# Patient Record
Sex: Male | Born: 1940
Health system: Southern US, Community
[De-identification: ages and names within clinical notes are randomized; demographics above are authoritative.]

## PROBLEM LIST (undated history)

## (undated) DIAGNOSIS — K5792 Diverticulitis of intestine, part unspecified, without perforation or abscess without bleeding: Secondary | ICD-10-CM

## (undated) DIAGNOSIS — N200 Calculus of kidney: Secondary | ICD-10-CM

## (undated) DIAGNOSIS — N400 Enlarged prostate without lower urinary tract symptoms: Secondary | ICD-10-CM

## (undated) DIAGNOSIS — C801 Malignant (primary) neoplasm, unspecified: Secondary | ICD-10-CM

## (undated) DIAGNOSIS — K859 Acute pancreatitis without necrosis or infection, unspecified: Secondary | ICD-10-CM

## (undated) DIAGNOSIS — E119 Type 2 diabetes mellitus without complications: Secondary | ICD-10-CM

## (undated) HISTORY — PX: OTHER SURGICAL HISTORY: SHX169

## (undated) HISTORY — PX: WISDOM TOOTH EXTRACTION: SHX21

## (undated) HISTORY — PX: TONSILLECTOMY: SUR1361

---

## 1999-05-28 ENCOUNTER — Encounter: Admission: RE | Admit: 1999-05-28 | Discharge: 1999-08-26 | Payer: Self-pay | Admitting: Geriatric Medicine

## 2000-07-13 HISTORY — PX: SHOULDER ARTHROSCOPY WITH OPEN ROTATOR CUFF REPAIR: SHX6092

## 2002-11-22 ENCOUNTER — Encounter (INDEPENDENT_AMBULATORY_CARE_PROVIDER_SITE_OTHER): Payer: Self-pay | Admitting: Specialist

## 2002-11-22 ENCOUNTER — Ambulatory Visit (HOSPITAL_COMMUNITY): Admission: RE | Admit: 2002-11-22 | Discharge: 2002-11-22 | Payer: Self-pay | Admitting: Gastroenterology

## 2007-06-10 ENCOUNTER — Emergency Department (HOSPITAL_COMMUNITY): Admission: EM | Admit: 2007-06-10 | Discharge: 2007-06-10 | Payer: Self-pay | Admitting: Emergency Medicine

## 2007-12-13 ENCOUNTER — Encounter: Admission: RE | Admit: 2007-12-13 | Discharge: 2007-12-13 | Payer: Self-pay | Admitting: Geriatric Medicine

## 2010-09-19 ENCOUNTER — Other Ambulatory Visit: Payer: Self-pay | Admitting: Dermatology

## 2010-11-28 NOTE — Op Note (Signed)
NAME:  STEPAN, VERRETTE NO.:  [id number]   MEDICAL RECORD NO.:  [id number]                   PATIENT TYPE:  AMB   LOCATION:  ENDO                                 FACILITY:  [facility]   PHYSICIAN:  Danise Edge, M.D.                DATE OF BIRTH:  [birth year]   DATE OF PROCEDURE:  11/22/2002  DATE OF DISCHARGE:                                 OPERATIVE REPORT   PROCEDURE:  Colonoscopy and polypectomy.   INDICATIONS FOR PROCEDURE:  Mr. Kanoa Phillippi is a 70 year old male born  08/01/1940. Mr. Brallier underwent a colonoscopy in 1999 and neoplastic  colon polyps were removed. Mr. Cwynar is scheduled for a surveillance  colonoscopy with polypectomy to prevent colon cancer.   ENDOSCOPIST:  Charolett Bumpers, M.D.   PREMEDICATION:  Versed 7.5 mg, Demerol 50 mg .   DESCRIPTION OF PROCEDURE:  After obtaining informed consent, Mr. Hosick was  placed in the left lateral decubitus position. I administered intravenous  Demerol and intravenous Versed to achieve conscious sedation for the  procedure. The patient's blood pressure, oxygen saturation and cardiac  rhythm were monitored throughout the procedure and documented in the medical  record.   Anal inspection was normal. Digital rectal exam revealed a nonnodular  prostate. The Olympus adult colonoscope was introduced into the rectum and  advanced to the cecum. Colonic preparation for the exam today was  satisfactory.   Mr. Roger has universal colonic diverticulosis without diverticulitis or  diverticular stricture formation.   RECTUM:  Normal.   SIGMOID COLON AND DESCENDING COLON:  At 70 cm from the anal verge, in the  proximal sigmoid colon, a 1 mm sessile polyp was removed with the hot biopsy  forceps.   SPLENIC FLEXURE:  Normal.   TRANSVERSE COLON:  Normal.   HEPATIC FLEXURE:  Normal.   ASCENDING COLON:  From the distal ascending colon, a 2 mm sessile polyp was  removed with the electrocautery  snare.   CECUM AND ILEOCECAL VALVE:  Normal.   ASSESSMENT:  1. Universal colonic diverticulosis.  2.     A small polyp was removed from the distal ascending colon and a small polyp      was removed from the proximal sigmoid colon; both polyps were submitted     in one bottle for pathological evaluation.   RECOMMENDATIONS:  Repeat colonoscopy in five years.                                               Danise Edge, M.D.    MJ/MEDQ  D:  11/22/2002  T:  11/22/2002  Job:  [id number]   cc:   Hal T. Stoneking, M.D.  [address] [place], [place] [address]  Fax: [phone]

## 2011-04-21 LAB — URINE CULTURE
Colony Count: NO GROWTH
Culture: NO GROWTH

## 2011-04-21 LAB — URINALYSIS, ROUTINE W REFLEX MICROSCOPIC
Bilirubin Urine: NEGATIVE
Glucose, UA: 100 — AB
Ketones, ur: NEGATIVE
Protein, ur: NEGATIVE

## 2011-04-21 LAB — URINE MICROSCOPIC-ADD ON

## 2011-07-24 DIAGNOSIS — Z79899 Other long term (current) drug therapy: Secondary | ICD-10-CM | POA: Diagnosis not present

## 2011-07-24 DIAGNOSIS — G479 Sleep disorder, unspecified: Secondary | ICD-10-CM | POA: Diagnosis not present

## 2011-07-24 DIAGNOSIS — I10 Essential (primary) hypertension: Secondary | ICD-10-CM | POA: Diagnosis not present

## 2011-07-24 DIAGNOSIS — E119 Type 2 diabetes mellitus without complications: Secondary | ICD-10-CM | POA: Diagnosis not present

## 2011-07-24 DIAGNOSIS — E7800 Pure hypercholesterolemia, unspecified: Secondary | ICD-10-CM | POA: Diagnosis not present

## 2011-08-21 DIAGNOSIS — Z79899 Other long term (current) drug therapy: Secondary | ICD-10-CM | POA: Diagnosis not present

## 2011-08-21 DIAGNOSIS — I10 Essential (primary) hypertension: Secondary | ICD-10-CM | POA: Diagnosis not present

## 2011-08-21 DIAGNOSIS — E7800 Pure hypercholesterolemia, unspecified: Secondary | ICD-10-CM | POA: Diagnosis not present

## 2011-08-21 DIAGNOSIS — E119 Type 2 diabetes mellitus without complications: Secondary | ICD-10-CM | POA: Diagnosis not present

## 2011-08-21 DIAGNOSIS — G479 Sleep disorder, unspecified: Secondary | ICD-10-CM | POA: Diagnosis not present

## 2011-10-20 DIAGNOSIS — R351 Nocturia: Secondary | ICD-10-CM | POA: Diagnosis not present

## 2011-10-20 DIAGNOSIS — N401 Enlarged prostate with lower urinary tract symptoms: Secondary | ICD-10-CM | POA: Diagnosis not present

## 2011-10-20 DIAGNOSIS — R972 Elevated prostate specific antigen [PSA]: Secondary | ICD-10-CM | POA: Diagnosis not present

## 2011-10-23 DIAGNOSIS — T148XXA Other injury of unspecified body region, initial encounter: Secondary | ICD-10-CM | POA: Diagnosis not present

## 2011-11-05 DIAGNOSIS — H40019 Open angle with borderline findings, low risk, unspecified eye: Secondary | ICD-10-CM | POA: Diagnosis not present

## 2011-11-05 DIAGNOSIS — H2510 Age-related nuclear cataract, unspecified eye: Secondary | ICD-10-CM | POA: Diagnosis not present

## 2011-11-05 DIAGNOSIS — E119 Type 2 diabetes mellitus without complications: Secondary | ICD-10-CM | POA: Diagnosis not present

## 2011-11-17 ENCOUNTER — Other Ambulatory Visit: Payer: Self-pay | Admitting: Dermatology

## 2011-11-17 DIAGNOSIS — L820 Inflamed seborrheic keratosis: Secondary | ICD-10-CM | POA: Diagnosis not present

## 2011-11-17 DIAGNOSIS — D239 Other benign neoplasm of skin, unspecified: Secondary | ICD-10-CM | POA: Diagnosis not present

## 2011-11-17 DIAGNOSIS — D485 Neoplasm of uncertain behavior of skin: Secondary | ICD-10-CM | POA: Diagnosis not present

## 2011-12-26 ENCOUNTER — Ambulatory Visit (INDEPENDENT_AMBULATORY_CARE_PROVIDER_SITE_OTHER): Payer: Medicare Other | Admitting: Family Medicine

## 2011-12-26 VITALS — BP 132/79 | HR 102 | Temp 98.0°F | Resp 18 | Ht 75.0 in | Wt 217.0 lb

## 2011-12-26 DIAGNOSIS — H00019 Hordeolum externum unspecified eye, unspecified eyelid: Secondary | ICD-10-CM | POA: Diagnosis not present

## 2011-12-26 MED ORDER — ERYTHROMYCIN 5 MG/GM OP OINT: TOPICAL_OINTMENT | OPHTHALMIC | Status: DC

## 2011-12-26 NOTE — Progress Notes (Signed)
     Patient Name: Samuel Ramos Date of Birth: 1941/03/18 Medical Record Number: [id number] Gender: male Date of Encounter: 12/26/2011  History of Present Illness:  Samuel Ramos is a 71 y.o. very pleasant male patient who presents with the following:  He may have gotten Grub-x, which is a pesticide, in his right eye a few days ago- this occurred on Thursday and today is Saturday.  He was outside and had been applying the grub treatment to the lawn- a sudden strong wind came in and blew dust and dirt into his eyes.  He wears glasses- never contacts.  He was wearing glasses at the time.  He feels that his vision is about like normal, but that he has to blink sometimes to clear away mucus.  His eyelid became red last night, and it is tender.  He does not have any crusting or eye discharge, the eye has not been stuck shut.  No photophobia  He called poison control today and they told him to come in for evaluation.  However, the product hotline stated that he was not in danger of eye damage from the product, but that he should be checked to be sure he does not have a corneal abrasion or a stye. Indeed, his symptoms seems more due to his eyelid than the globe itself, and he did not notice anything amiss until yesterday.    There is no problem list on file for this patient.  No past medical history on file. No past surgical history on file. History  Substance Use Topics  . Smoking status: Never Smoker   . Smokeless tobacco: Not on file  . Alcohol Use: Not on file   No family history on file. Allergies  Allergen Reactions  . Vicodin (Hydrocodone-Acetaminophen) Other (See Comments)    Nightmares    Medication list has been reviewed and updated.  Prior to Admission medications   Not on File    Review of Systems:  As per HPI- otherwise negative.   Physical Examination: Filed Vitals:   12/26/11 1820  BP: 132/79  Pulse: 102  Temp: 98 F (36.7 C)  Resp: 18   Filed Vitals:   12/26/11 1820  Height: 6\' 3"  (1.905 m)  Weight: 217 lb (98.431 kg)   Body mass index is 27.12 kg/(m^2). Ideal Body Weight: Weight in (lb) to have BMI = 25: 199.6   GEN: WDWN, NAD, Non-toxic, A & O x 3 HEENT: Atraumatic, Normocephalic. Neck supple. No masses, No LAD.  Tm and oropharynx wnl PEERL, EOMI, fundoscopic exam normal.  Right upper lid is slightly edematous and appears to have the beginning of a stye. Fluorescin testing wnl.  conjunctivae wnl, no injection or discharge.  Ears and Nose: No external deformity. CV: RRR, No M/G/R. No JVD. No thrill. No extra heart sounds. PULM: CTA B, no wheezes, crackles, rhonchi. No retractions. No resp. distress. No accessory muscle use. EXTR: No c/c/e NEURO Normal gait.  PSYCH: Normally interactive. Conversant. Not depressed or anxious appearing.  Calm demeanor.    Assessment and Plan: 1. Stye  erythromycin ophthalmic ointment   I think Braydin actually is getting a stye.  Will use erythromycin opthalmic ointment for lubrication and treatment of any conjunctivitis, and will do warm compresses.  If he is not feeling better or develops any globe pain or vision change/ photophobia he will call us right away.    Abbe Amsterdam, MD

## 2012-03-24 DIAGNOSIS — Z79899 Other long term (current) drug therapy: Secondary | ICD-10-CM | POA: Diagnosis not present

## 2012-03-24 DIAGNOSIS — Z0000 Encounter for general adult medical examination without abnormal findings: Secondary | ICD-10-CM | POA: Diagnosis not present

## 2012-03-24 DIAGNOSIS — E7800 Pure hypercholesterolemia, unspecified: Secondary | ICD-10-CM | POA: Diagnosis not present

## 2012-03-24 DIAGNOSIS — I10 Essential (primary) hypertension: Secondary | ICD-10-CM | POA: Diagnosis not present

## 2012-03-24 DIAGNOSIS — Z1331 Encounter for screening for depression: Secondary | ICD-10-CM | POA: Diagnosis not present

## 2012-03-24 DIAGNOSIS — E119 Type 2 diabetes mellitus without complications: Secondary | ICD-10-CM | POA: Diagnosis not present

## 2012-03-28 DIAGNOSIS — N390 Urinary tract infection, site not specified: Secondary | ICD-10-CM | POA: Diagnosis not present

## 2012-05-20 DIAGNOSIS — D239 Other benign neoplasm of skin, unspecified: Secondary | ICD-10-CM | POA: Diagnosis not present

## 2012-05-20 DIAGNOSIS — L821 Other seborrheic keratosis: Secondary | ICD-10-CM | POA: Diagnosis not present

## 2012-05-20 DIAGNOSIS — L570 Actinic keratosis: Secondary | ICD-10-CM | POA: Diagnosis not present

## 2012-05-20 DIAGNOSIS — D1801 Hemangioma of skin and subcutaneous tissue: Secondary | ICD-10-CM | POA: Diagnosis not present

## 2012-05-20 DIAGNOSIS — L819 Disorder of pigmentation, unspecified: Secondary | ICD-10-CM | POA: Diagnosis not present

## 2012-05-20 DIAGNOSIS — L578 Other skin changes due to chronic exposure to nonionizing radiation: Secondary | ICD-10-CM | POA: Diagnosis not present

## 2012-09-21 DIAGNOSIS — I10 Essential (primary) hypertension: Secondary | ICD-10-CM | POA: Diagnosis not present

## 2012-09-21 DIAGNOSIS — E7800 Pure hypercholesterolemia, unspecified: Secondary | ICD-10-CM | POA: Diagnosis not present

## 2012-09-21 DIAGNOSIS — M76899 Other specified enthesopathies of unspecified lower limb, excluding foot: Secondary | ICD-10-CM | POA: Diagnosis not present

## 2012-09-21 DIAGNOSIS — E119 Type 2 diabetes mellitus without complications: Secondary | ICD-10-CM | POA: Diagnosis not present

## 2012-09-21 DIAGNOSIS — Z79899 Other long term (current) drug therapy: Secondary | ICD-10-CM | POA: Diagnosis not present

## 2012-09-26 DIAGNOSIS — M5412 Radiculopathy, cervical region: Secondary | ICD-10-CM | POA: Diagnosis not present

## 2012-11-09 DIAGNOSIS — H04129 Dry eye syndrome of unspecified lacrimal gland: Secondary | ICD-10-CM | POA: Diagnosis not present

## 2012-11-09 DIAGNOSIS — H2510 Age-related nuclear cataract, unspecified eye: Secondary | ICD-10-CM | POA: Diagnosis not present

## 2012-11-09 DIAGNOSIS — H40019 Open angle with borderline findings, low risk, unspecified eye: Secondary | ICD-10-CM | POA: Diagnosis not present

## 2012-11-09 DIAGNOSIS — E119 Type 2 diabetes mellitus without complications: Secondary | ICD-10-CM | POA: Diagnosis not present

## 2012-11-11 DIAGNOSIS — R339 Retention of urine, unspecified: Secondary | ICD-10-CM | POA: Diagnosis not present

## 2012-11-11 DIAGNOSIS — N419 Inflammatory disease of prostate, unspecified: Secondary | ICD-10-CM | POA: Diagnosis not present

## 2012-11-11 DIAGNOSIS — R338 Other retention of urine: Secondary | ICD-10-CM | POA: Diagnosis not present

## 2012-11-16 DIAGNOSIS — T8389XA Other specified complication of genitourinary prosthetic devices, implants and grafts, initial encounter: Secondary | ICD-10-CM | POA: Diagnosis not present

## 2012-11-16 DIAGNOSIS — E119 Type 2 diabetes mellitus without complications: Secondary | ICD-10-CM | POA: Diagnosis not present

## 2012-11-16 DIAGNOSIS — N401 Enlarged prostate with lower urinary tract symptoms: Secondary | ICD-10-CM | POA: Diagnosis not present

## 2012-11-16 DIAGNOSIS — R339 Retention of urine, unspecified: Secondary | ICD-10-CM | POA: Diagnosis not present

## 2012-11-16 DIAGNOSIS — N400 Enlarged prostate without lower urinary tract symptoms: Secondary | ICD-10-CM | POA: Diagnosis not present

## 2012-11-16 DIAGNOSIS — R300 Dysuria: Secondary | ICD-10-CM | POA: Diagnosis not present

## 2012-11-16 DIAGNOSIS — I10 Essential (primary) hypertension: Secondary | ICD-10-CM | POA: Diagnosis not present

## 2012-11-18 ENCOUNTER — Other Ambulatory Visit: Payer: Self-pay | Admitting: Dermatology

## 2012-11-18 DIAGNOSIS — L578 Other skin changes due to chronic exposure to nonionizing radiation: Secondary | ICD-10-CM | POA: Diagnosis not present

## 2012-11-18 DIAGNOSIS — L259 Unspecified contact dermatitis, unspecified cause: Secondary | ICD-10-CM | POA: Diagnosis not present

## 2012-11-18 DIAGNOSIS — D485 Neoplasm of uncertain behavior of skin: Secondary | ICD-10-CM | POA: Diagnosis not present

## 2012-11-18 DIAGNOSIS — D239 Other benign neoplasm of skin, unspecified: Secondary | ICD-10-CM | POA: Diagnosis not present

## 2012-11-18 DIAGNOSIS — L819 Disorder of pigmentation, unspecified: Secondary | ICD-10-CM | POA: Diagnosis not present

## 2012-11-18 DIAGNOSIS — L821 Other seborrheic keratosis: Secondary | ICD-10-CM | POA: Diagnosis not present

## 2012-11-18 DIAGNOSIS — D1801 Hemangioma of skin and subcutaneous tissue: Secondary | ICD-10-CM | POA: Diagnosis not present

## 2012-11-18 DIAGNOSIS — N401 Enlarged prostate with lower urinary tract symptoms: Secondary | ICD-10-CM | POA: Diagnosis not present

## 2012-11-18 DIAGNOSIS — R339 Retention of urine, unspecified: Secondary | ICD-10-CM | POA: Diagnosis not present

## 2012-12-13 ENCOUNTER — Other Ambulatory Visit: Payer: Self-pay | Admitting: Dermatology

## 2012-12-13 DIAGNOSIS — D485 Neoplasm of uncertain behavior of skin: Secondary | ICD-10-CM | POA: Diagnosis not present

## 2013-01-05 ENCOUNTER — Other Ambulatory Visit: Payer: Self-pay | Admitting: Geriatric Medicine

## 2013-01-05 DIAGNOSIS — R109 Unspecified abdominal pain: Secondary | ICD-10-CM

## 2013-01-05 DIAGNOSIS — I10 Essential (primary) hypertension: Secondary | ICD-10-CM | POA: Diagnosis not present

## 2013-01-05 DIAGNOSIS — D649 Anemia, unspecified: Secondary | ICD-10-CM | POA: Diagnosis not present

## 2013-01-05 DIAGNOSIS — K219 Gastro-esophageal reflux disease without esophagitis: Secondary | ICD-10-CM | POA: Diagnosis not present

## 2013-01-06 ENCOUNTER — Ambulatory Visit
Admission: RE | Admit: 2013-01-06 | Discharge: 2013-01-06 | Disposition: A | Discharge status: Home / Self Care | Payer: 59 | Source: Ambulatory Visit | Attending: Geriatric Medicine | Admitting: Geriatric Medicine

## 2013-01-06 DIAGNOSIS — K5730 Diverticulosis of large intestine without perforation or abscess without bleeding: Secondary | ICD-10-CM | POA: Diagnosis not present

## 2013-01-06 DIAGNOSIS — K863 Pseudocyst of pancreas: Secondary | ICD-10-CM | POA: Diagnosis not present

## 2013-01-06 DIAGNOSIS — N200 Calculus of kidney: Secondary | ICD-10-CM | POA: Diagnosis not present

## 2013-01-06 DIAGNOSIS — R109 Unspecified abdominal pain: Secondary | ICD-10-CM

## 2013-01-06 DIAGNOSIS — N400 Enlarged prostate without lower urinary tract symptoms: Secondary | ICD-10-CM | POA: Diagnosis not present

## 2013-01-06 MED ORDER — IOHEXOL 300 MG/ML  SOLN
125.0000 mL | Freq: Once | INTRAMUSCULAR | Status: AC | PRN
  Administered 2013-01-06: 125 mL via INTRAVENOUS

## 2013-01-09 DIAGNOSIS — R972 Elevated prostate specific antigen [PSA]: Secondary | ICD-10-CM | POA: Diagnosis not present

## 2013-01-11 DIAGNOSIS — K863 Pseudocyst of pancreas: Secondary | ICD-10-CM | POA: Diagnosis not present

## 2013-01-11 DIAGNOSIS — Z86010 Personal history of colonic polyps: Secondary | ICD-10-CM | POA: Diagnosis not present

## 2013-01-11 DIAGNOSIS — D509 Iron deficiency anemia, unspecified: Secondary | ICD-10-CM | POA: Diagnosis not present

## 2013-01-11 DIAGNOSIS — K862 Cyst of pancreas: Secondary | ICD-10-CM | POA: Diagnosis not present

## 2013-01-20 DIAGNOSIS — N401 Enlarged prostate with lower urinary tract symptoms: Secondary | ICD-10-CM | POA: Diagnosis not present

## 2013-01-20 DIAGNOSIS — R972 Elevated prostate specific antigen [PSA]: Secondary | ICD-10-CM | POA: Diagnosis not present

## 2013-01-20 DIAGNOSIS — N529 Male erectile dysfunction, unspecified: Secondary | ICD-10-CM | POA: Diagnosis not present

## 2013-01-26 ENCOUNTER — Other Ambulatory Visit: Payer: Self-pay | Admitting: Gastroenterology

## 2013-01-26 DIAGNOSIS — K2970 Gastritis, unspecified, without bleeding: Secondary | ICD-10-CM | POA: Diagnosis not present

## 2013-01-26 DIAGNOSIS — D509 Iron deficiency anemia, unspecified: Secondary | ICD-10-CM | POA: Diagnosis not present

## 2013-01-26 DIAGNOSIS — K620 Anal polyp: Secondary | ICD-10-CM | POA: Diagnosis not present

## 2013-01-26 DIAGNOSIS — Z86010 Personal history of colonic polyps: Secondary | ICD-10-CM | POA: Diagnosis not present

## 2013-01-26 DIAGNOSIS — Z09 Encounter for follow-up examination after completed treatment for conditions other than malignant neoplasm: Secondary | ICD-10-CM | POA: Diagnosis not present

## 2013-01-26 DIAGNOSIS — D126 Benign neoplasm of colon, unspecified: Secondary | ICD-10-CM | POA: Diagnosis not present

## 2013-01-26 DIAGNOSIS — D131 Benign neoplasm of stomach: Secondary | ICD-10-CM | POA: Diagnosis not present

## 2013-01-26 DIAGNOSIS — K621 Rectal polyp: Secondary | ICD-10-CM | POA: Diagnosis not present

## 2013-01-26 DIAGNOSIS — K5730 Diverticulosis of large intestine without perforation or abscess without bleeding: Secondary | ICD-10-CM | POA: Diagnosis not present

## 2013-03-22 DIAGNOSIS — R05 Cough: Secondary | ICD-10-CM | POA: Diagnosis not present

## 2013-03-22 DIAGNOSIS — H1130 Conjunctival hemorrhage, unspecified eye: Secondary | ICD-10-CM | POA: Diagnosis not present

## 2013-03-22 DIAGNOSIS — I10 Essential (primary) hypertension: Secondary | ICD-10-CM | POA: Diagnosis not present

## 2013-03-22 DIAGNOSIS — H6120 Impacted cerumen, unspecified ear: Secondary | ICD-10-CM | POA: Diagnosis not present

## 2013-03-22 DIAGNOSIS — G501 Atypical facial pain: Secondary | ICD-10-CM | POA: Diagnosis not present

## 2013-03-24 DIAGNOSIS — H6120 Impacted cerumen, unspecified ear: Secondary | ICD-10-CM | POA: Diagnosis not present

## 2013-03-24 DIAGNOSIS — J342 Deviated nasal septum: Secondary | ICD-10-CM | POA: Diagnosis not present

## 2013-03-24 DIAGNOSIS — J0100 Acute maxillary sinusitis, unspecified: Secondary | ICD-10-CM | POA: Diagnosis not present

## 2013-03-28 DIAGNOSIS — D509 Iron deficiency anemia, unspecified: Secondary | ICD-10-CM | POA: Diagnosis not present

## 2013-03-28 DIAGNOSIS — Z0000 Encounter for general adult medical examination without abnormal findings: Secondary | ICD-10-CM | POA: Diagnosis not present

## 2013-03-28 DIAGNOSIS — E119 Type 2 diabetes mellitus without complications: Secondary | ICD-10-CM | POA: Diagnosis not present

## 2013-03-28 DIAGNOSIS — E7800 Pure hypercholesterolemia, unspecified: Secondary | ICD-10-CM | POA: Diagnosis not present

## 2013-03-28 DIAGNOSIS — Z1331 Encounter for screening for depression: Secondary | ICD-10-CM | POA: Diagnosis not present

## 2013-03-28 DIAGNOSIS — Z79899 Other long term (current) drug therapy: Secondary | ICD-10-CM | POA: Diagnosis not present

## 2013-05-10 DIAGNOSIS — D509 Iron deficiency anemia, unspecified: Secondary | ICD-10-CM | POA: Diagnosis not present

## 2013-05-19 ENCOUNTER — Emergency Department (HOSPITAL_COMMUNITY)
Admission: EM | Admit: 2013-05-19 | Discharge: 2013-05-19 | Disposition: A | Discharge status: Home / Self Care | Payer: Medicare Other | Attending: Emergency Medicine | Admitting: Emergency Medicine

## 2013-05-19 ENCOUNTER — Emergency Department (HOSPITAL_COMMUNITY): Payer: Medicare Other

## 2013-05-19 ENCOUNTER — Encounter (HOSPITAL_COMMUNITY): Payer: Self-pay | Admitting: Emergency Medicine

## 2013-05-19 ENCOUNTER — Other Ambulatory Visit: Payer: Self-pay | Admitting: Dermatology

## 2013-05-19 DIAGNOSIS — S8990XA Unspecified injury of unspecified lower leg, initial encounter: Secondary | ICD-10-CM | POA: Diagnosis not present

## 2013-05-19 DIAGNOSIS — W208XXA Other cause of strike by thrown, projected or falling object, initial encounter: Secondary | ICD-10-CM | POA: Insufficient documentation

## 2013-05-19 DIAGNOSIS — E119 Type 2 diabetes mellitus without complications: Secondary | ICD-10-CM | POA: Diagnosis not present

## 2013-05-19 DIAGNOSIS — D485 Neoplasm of uncertain behavior of skin: Secondary | ICD-10-CM | POA: Diagnosis not present

## 2013-05-19 DIAGNOSIS — Y9389 Activity, other specified: Secondary | ICD-10-CM | POA: Insufficient documentation

## 2013-05-19 DIAGNOSIS — S99922A Unspecified injury of left foot, initial encounter: Secondary | ICD-10-CM

## 2013-05-19 DIAGNOSIS — Y929 Unspecified place or not applicable: Secondary | ICD-10-CM | POA: Insufficient documentation

## 2013-05-19 HISTORY — DX: Type 2 diabetes mellitus without complications: E11.9

## 2013-05-19 MED ORDER — TRAMADOL HCL 50 MG PO TABS: 50.0000 mg | ORAL_TABLET | Freq: Four times a day (QID) | ORAL | Status: DC | PRN

## 2013-05-19 NOTE — ED Notes (Signed)
The pt  Dropped a block onto his  Lt foot earlier today.  Pt c/o pain

## 2013-05-19 NOTE — ED Notes (Signed)
Pt comfortable with d/c and f/u instructions. Prescriptions x1 

## 2013-05-19 NOTE — ED Provider Notes (Signed)
CSN: [id number]     Arrival date & time 05/19/13  1910 History  This chart was scribed for non-physician practitioner, Emilia Beck, PA-C,working with Donnetta Hutching, MD, by Karle Plumber, ED Scribe.  This patient was seen in room TR06C/TR06C and the patient's care was started at 8:10 PM.  Chief Complaint  Patient presents with  . Foot Injury   Patient is a 72 y.o. male presenting with foot injury. The history is provided by the patient. No language interpreter was used.  Foot Injury Location:  Foot Injury: yes   Mechanism of injury comment:  Pt dropped block onto foot Foot location:  L foot  HPI Comments:  Samuel Ramos is a 72 y.o. male who presents to the Emergency Department complaining ofsudden onset left foot pain after dropping a large stone on it PTA. He describes the pain as throbbing. Pt states walking worsens pain and nothing makes the pain any better. He states there is an associated abrasion to the top of the foot.   Past Medical History  Diagnosis Date  . Diabetes mellitus without complication    History reviewed. No pertinent past surgical history. No family history on file. History  Substance Use Topics  . Smoking status: Never Smoker   . Smokeless tobacco: Not on file  . Alcohol Use: Yes    Review of Systems  Musculoskeletal: Positive for arthralgias.  All other systems reviewed and are negative.    Allergies  Vicodin  Home Medications   Current Outpatient Rx  Name  Route  Sig  Dispense  Refill  . erythromycin ophthalmic ointment      Apply to right eye as needed up to 6 times a day.   Dispense 0.5 percent strength   3.5 g   0    Triage Vitals: BP 148/82  Pulse 97  Temp(Src) 97.9 F (36.6 C) (Oral)  Resp 20  Ht 6\' 4"  (1.93 m)  Wt 205 lb (92.987 kg)  BMI 24.96 kg/m2  SpO2 97% Physical Exam  Nursing note and vitals reviewed. Constitutional: He is oriented to person, place, and time. He appears well-developed and well-nourished. No  distress.  HENT:  Head: Normocephalic and atraumatic.  Eyes: Conjunctivae are normal. No scleral icterus.  Neck: Neck supple.  Cardiovascular: Normal rate and intact distal pulses.   Pulmonary/Chest: Effort normal. No stridor. No respiratory distress.  Abdominal: Normal appearance. He exhibits no distension.  Musculoskeletal:  Localized swelling to dorsal left foot that is mildly tender to palpation. Small abrasion to dorsal left foot. No obvious deformity.   Neurological: He is alert and oriented to person, place, and time.  Skin: Skin is warm and dry. No rash noted.  Psychiatric: He has a normal mood and affect. His behavior is normal.    ED Course  Procedures (including critical care time) DIAGNOSTIC STUDIES: Oxygen Saturation is 97% on RA, normal by my interpretation.   COORDINATION OF CARE: 8:15 PM- Will obtain X-Ray. Will prescribe pain medication. Pt verbalizes understanding and agrees to plan.  Medications - No data to display  Labs Review Labs Reviewed - No data to display Imaging Review Dg Foot Complete Left  05/19/2013   CLINICAL DATA:  Dropped weight on foot  EXAM: LEFT FOOT - COMPLETE 3+ VIEW  COMPARISON:  None.  FINDINGS: There is no evidence of fracture or dislocation. There is no evidence of arthropathy or other focal bone abnormality. Soft tissues are unremarkable.  IMPRESSION: Negative.   Electronically Signed   By: Ladona Ridgel  Bradly Chris M.D.   On: 05/19/2013 20:05    EKG Interpretation   None       MDM   1. Foot injury, left, initial encounter    8:36 PM Xray unremarkable for acute changes. Patient will have Tramadol prescription as needed for pain. No neurovascular compromise. Vitals stable and patient afebrile. No other injury.   I personally performed the services described in this documentation, which was scribed in my presence. The recorded information has been reviewed and is accurate.    Emilia Beck, PA-C 05/19/13 2036

## 2013-05-23 NOTE — ED Provider Notes (Signed)
Medical screening examination/treatment/procedure(s) were performed by non-physician practitioner and as supervising physician I was immediately available for consultation/collaboration.  EKG Interpretation   None        Donnetta Hutching, MD 05/23/13 [phone]

## 2013-05-30 ENCOUNTER — Other Ambulatory Visit: Payer: Self-pay | Admitting: Dermatology

## 2013-05-30 DIAGNOSIS — D485 Neoplasm of uncertain behavior of skin: Secondary | ICD-10-CM | POA: Diagnosis not present

## 2013-07-28 DIAGNOSIS — IMO0001 Reserved for inherently not codable concepts without codable children: Secondary | ICD-10-CM | POA: Diagnosis not present

## 2013-07-28 DIAGNOSIS — D509 Iron deficiency anemia, unspecified: Secondary | ICD-10-CM | POA: Diagnosis not present

## 2013-09-27 DIAGNOSIS — E119 Type 2 diabetes mellitus without complications: Secondary | ICD-10-CM | POA: Diagnosis not present

## 2013-09-27 DIAGNOSIS — D509 Iron deficiency anemia, unspecified: Secondary | ICD-10-CM | POA: Diagnosis not present

## 2013-09-27 DIAGNOSIS — I10 Essential (primary) hypertension: Secondary | ICD-10-CM | POA: Diagnosis not present

## 2013-09-27 DIAGNOSIS — E7800 Pure hypercholesterolemia, unspecified: Secondary | ICD-10-CM | POA: Diagnosis not present

## 2013-09-27 DIAGNOSIS — Z79899 Other long term (current) drug therapy: Secondary | ICD-10-CM | POA: Diagnosis not present

## 2013-11-01 DIAGNOSIS — IMO0002 Reserved for concepts with insufficient information to code with codable children: Secondary | ICD-10-CM | POA: Diagnosis not present

## 2013-11-01 DIAGNOSIS — M999 Biomechanical lesion, unspecified: Secondary | ICD-10-CM | POA: Diagnosis not present

## 2013-11-01 DIAGNOSIS — M25559 Pain in unspecified hip: Secondary | ICD-10-CM | POA: Diagnosis not present

## 2013-11-01 DIAGNOSIS — M5137 Other intervertebral disc degeneration, lumbosacral region: Secondary | ICD-10-CM | POA: Diagnosis not present

## 2013-11-02 DIAGNOSIS — M25559 Pain in unspecified hip: Secondary | ICD-10-CM | POA: Diagnosis not present

## 2013-11-02 DIAGNOSIS — M5137 Other intervertebral disc degeneration, lumbosacral region: Secondary | ICD-10-CM | POA: Diagnosis not present

## 2013-11-02 DIAGNOSIS — IMO0002 Reserved for concepts with insufficient information to code with codable children: Secondary | ICD-10-CM | POA: Diagnosis not present

## 2013-11-02 DIAGNOSIS — M999 Biomechanical lesion, unspecified: Secondary | ICD-10-CM | POA: Diagnosis not present

## 2013-11-03 DIAGNOSIS — IMO0002 Reserved for concepts with insufficient information to code with codable children: Secondary | ICD-10-CM | POA: Diagnosis not present

## 2013-11-03 DIAGNOSIS — M999 Biomechanical lesion, unspecified: Secondary | ICD-10-CM | POA: Diagnosis not present

## 2013-11-03 DIAGNOSIS — M5137 Other intervertebral disc degeneration, lumbosacral region: Secondary | ICD-10-CM | POA: Diagnosis not present

## 2013-11-03 DIAGNOSIS — M25559 Pain in unspecified hip: Secondary | ICD-10-CM | POA: Diagnosis not present

## 2013-11-06 DIAGNOSIS — M25559 Pain in unspecified hip: Secondary | ICD-10-CM | POA: Diagnosis not present

## 2013-11-06 DIAGNOSIS — M999 Biomechanical lesion, unspecified: Secondary | ICD-10-CM | POA: Diagnosis not present

## 2013-11-06 DIAGNOSIS — IMO0002 Reserved for concepts with insufficient information to code with codable children: Secondary | ICD-10-CM | POA: Diagnosis not present

## 2013-11-06 DIAGNOSIS — M5137 Other intervertebral disc degeneration, lumbosacral region: Secondary | ICD-10-CM | POA: Diagnosis not present

## 2013-11-08 DIAGNOSIS — IMO0002 Reserved for concepts with insufficient information to code with codable children: Secondary | ICD-10-CM | POA: Diagnosis not present

## 2013-11-08 DIAGNOSIS — M999 Biomechanical lesion, unspecified: Secondary | ICD-10-CM | POA: Diagnosis not present

## 2013-11-08 DIAGNOSIS — M5137 Other intervertebral disc degeneration, lumbosacral region: Secondary | ICD-10-CM | POA: Diagnosis not present

## 2013-11-08 DIAGNOSIS — M25559 Pain in unspecified hip: Secondary | ICD-10-CM | POA: Diagnosis not present

## 2013-11-10 DIAGNOSIS — M25559 Pain in unspecified hip: Secondary | ICD-10-CM | POA: Diagnosis not present

## 2013-11-10 DIAGNOSIS — M549 Dorsalgia, unspecified: Secondary | ICD-10-CM | POA: Diagnosis not present

## 2013-11-10 DIAGNOSIS — IMO0002 Reserved for concepts with insufficient information to code with codable children: Secondary | ICD-10-CM | POA: Diagnosis not present

## 2013-11-10 DIAGNOSIS — M999 Biomechanical lesion, unspecified: Secondary | ICD-10-CM | POA: Diagnosis not present

## 2013-11-10 DIAGNOSIS — M5137 Other intervertebral disc degeneration, lumbosacral region: Secondary | ICD-10-CM | POA: Diagnosis not present

## 2013-11-13 DIAGNOSIS — IMO0002 Reserved for concepts with insufficient information to code with codable children: Secondary | ICD-10-CM | POA: Diagnosis not present

## 2013-11-13 DIAGNOSIS — M5137 Other intervertebral disc degeneration, lumbosacral region: Secondary | ICD-10-CM | POA: Diagnosis not present

## 2013-11-13 DIAGNOSIS — M25559 Pain in unspecified hip: Secondary | ICD-10-CM | POA: Diagnosis not present

## 2013-11-13 DIAGNOSIS — M999 Biomechanical lesion, unspecified: Secondary | ICD-10-CM | POA: Diagnosis not present

## 2013-11-14 DIAGNOSIS — L259 Unspecified contact dermatitis, unspecified cause: Secondary | ICD-10-CM | POA: Diagnosis not present

## 2013-11-14 DIAGNOSIS — I8390 Asymptomatic varicose veins of unspecified lower extremity: Secondary | ICD-10-CM | POA: Diagnosis not present

## 2013-11-14 DIAGNOSIS — D239 Other benign neoplasm of skin, unspecified: Secondary | ICD-10-CM | POA: Diagnosis not present

## 2013-11-14 DIAGNOSIS — L821 Other seborrheic keratosis: Secondary | ICD-10-CM | POA: Diagnosis not present

## 2013-11-14 DIAGNOSIS — Z85820 Personal history of malignant melanoma of skin: Secondary | ICD-10-CM | POA: Diagnosis not present

## 2013-11-14 DIAGNOSIS — L570 Actinic keratosis: Secondary | ICD-10-CM | POA: Diagnosis not present

## 2013-11-14 DIAGNOSIS — D1801 Hemangioma of skin and subcutaneous tissue: Secondary | ICD-10-CM | POA: Diagnosis not present

## 2013-11-15 DIAGNOSIS — IMO0002 Reserved for concepts with insufficient information to code with codable children: Secondary | ICD-10-CM | POA: Diagnosis not present

## 2013-11-15 DIAGNOSIS — M5137 Other intervertebral disc degeneration, lumbosacral region: Secondary | ICD-10-CM | POA: Diagnosis not present

## 2013-11-15 DIAGNOSIS — M25559 Pain in unspecified hip: Secondary | ICD-10-CM | POA: Diagnosis not present

## 2013-11-15 DIAGNOSIS — M999 Biomechanical lesion, unspecified: Secondary | ICD-10-CM | POA: Diagnosis not present

## 2013-11-16 DIAGNOSIS — M5137 Other intervertebral disc degeneration, lumbosacral region: Secondary | ICD-10-CM | POA: Diagnosis not present

## 2013-11-16 DIAGNOSIS — IMO0002 Reserved for concepts with insufficient information to code with codable children: Secondary | ICD-10-CM | POA: Diagnosis not present

## 2013-11-16 DIAGNOSIS — M999 Biomechanical lesion, unspecified: Secondary | ICD-10-CM | POA: Diagnosis not present

## 2013-11-16 DIAGNOSIS — M25559 Pain in unspecified hip: Secondary | ICD-10-CM | POA: Diagnosis not present

## 2013-11-27 DIAGNOSIS — M999 Biomechanical lesion, unspecified: Secondary | ICD-10-CM | POA: Diagnosis not present

## 2013-11-27 DIAGNOSIS — M5137 Other intervertebral disc degeneration, lumbosacral region: Secondary | ICD-10-CM | POA: Diagnosis not present

## 2013-11-27 DIAGNOSIS — IMO0002 Reserved for concepts with insufficient information to code with codable children: Secondary | ICD-10-CM | POA: Diagnosis not present

## 2013-11-27 DIAGNOSIS — M25559 Pain in unspecified hip: Secondary | ICD-10-CM | POA: Diagnosis not present

## 2013-11-29 DIAGNOSIS — M25559 Pain in unspecified hip: Secondary | ICD-10-CM | POA: Diagnosis not present

## 2013-11-29 DIAGNOSIS — IMO0002 Reserved for concepts with insufficient information to code with codable children: Secondary | ICD-10-CM | POA: Diagnosis not present

## 2013-11-29 DIAGNOSIS — M5137 Other intervertebral disc degeneration, lumbosacral region: Secondary | ICD-10-CM | POA: Diagnosis not present

## 2013-11-29 DIAGNOSIS — M999 Biomechanical lesion, unspecified: Secondary | ICD-10-CM | POA: Diagnosis not present

## 2013-12-01 DIAGNOSIS — M25559 Pain in unspecified hip: Secondary | ICD-10-CM | POA: Diagnosis not present

## 2013-12-01 DIAGNOSIS — M5137 Other intervertebral disc degeneration, lumbosacral region: Secondary | ICD-10-CM | POA: Diagnosis not present

## 2013-12-01 DIAGNOSIS — IMO0002 Reserved for concepts with insufficient information to code with codable children: Secondary | ICD-10-CM | POA: Diagnosis not present

## 2013-12-01 DIAGNOSIS — M999 Biomechanical lesion, unspecified: Secondary | ICD-10-CM | POA: Diagnosis not present

## 2013-12-06 DIAGNOSIS — M999 Biomechanical lesion, unspecified: Secondary | ICD-10-CM | POA: Diagnosis not present

## 2013-12-06 DIAGNOSIS — M25559 Pain in unspecified hip: Secondary | ICD-10-CM | POA: Diagnosis not present

## 2013-12-06 DIAGNOSIS — IMO0002 Reserved for concepts with insufficient information to code with codable children: Secondary | ICD-10-CM | POA: Diagnosis not present

## 2013-12-06 DIAGNOSIS — M5137 Other intervertebral disc degeneration, lumbosacral region: Secondary | ICD-10-CM | POA: Diagnosis not present

## 2013-12-13 DIAGNOSIS — H2510 Age-related nuclear cataract, unspecified eye: Secondary | ICD-10-CM | POA: Diagnosis not present

## 2013-12-13 DIAGNOSIS — H04129 Dry eye syndrome of unspecified lacrimal gland: Secondary | ICD-10-CM | POA: Diagnosis not present

## 2013-12-13 DIAGNOSIS — H40019 Open angle with borderline findings, low risk, unspecified eye: Secondary | ICD-10-CM | POA: Diagnosis not present

## 2013-12-13 DIAGNOSIS — E119 Type 2 diabetes mellitus without complications: Secondary | ICD-10-CM | POA: Diagnosis not present

## 2013-12-15 DIAGNOSIS — M25559 Pain in unspecified hip: Secondary | ICD-10-CM | POA: Diagnosis not present

## 2013-12-15 DIAGNOSIS — M5137 Other intervertebral disc degeneration, lumbosacral region: Secondary | ICD-10-CM | POA: Diagnosis not present

## 2013-12-15 DIAGNOSIS — IMO0002 Reserved for concepts with insufficient information to code with codable children: Secondary | ICD-10-CM | POA: Diagnosis not present

## 2013-12-15 DIAGNOSIS — M999 Biomechanical lesion, unspecified: Secondary | ICD-10-CM | POA: Diagnosis not present

## 2013-12-20 DIAGNOSIS — M25559 Pain in unspecified hip: Secondary | ICD-10-CM | POA: Diagnosis not present

## 2013-12-20 DIAGNOSIS — IMO0002 Reserved for concepts with insufficient information to code with codable children: Secondary | ICD-10-CM | POA: Diagnosis not present

## 2013-12-20 DIAGNOSIS — M5137 Other intervertebral disc degeneration, lumbosacral region: Secondary | ICD-10-CM | POA: Diagnosis not present

## 2013-12-20 DIAGNOSIS — M999 Biomechanical lesion, unspecified: Secondary | ICD-10-CM | POA: Diagnosis not present

## 2013-12-22 ENCOUNTER — Ambulatory Visit
Admission: RE | Admit: 2013-12-22 | Discharge: 2013-12-22 | Disposition: A | Discharge status: Home / Self Care | Payer: Medicare Other | Source: Ambulatory Visit | Attending: Geriatric Medicine | Admitting: Geriatric Medicine

## 2013-12-22 ENCOUNTER — Other Ambulatory Visit: Payer: Self-pay | Admitting: Geriatric Medicine

## 2013-12-22 DIAGNOSIS — M549 Dorsalgia, unspecified: Secondary | ICD-10-CM

## 2013-12-22 DIAGNOSIS — M47817 Spondylosis without myelopathy or radiculopathy, lumbosacral region: Secondary | ICD-10-CM | POA: Diagnosis not present

## 2013-12-22 DIAGNOSIS — E119 Type 2 diabetes mellitus without complications: Secondary | ICD-10-CM | POA: Diagnosis not present

## 2013-12-25 DIAGNOSIS — IMO0002 Reserved for concepts with insufficient information to code with codable children: Secondary | ICD-10-CM | POA: Diagnosis not present

## 2013-12-25 DIAGNOSIS — M999 Biomechanical lesion, unspecified: Secondary | ICD-10-CM | POA: Diagnosis not present

## 2013-12-25 DIAGNOSIS — M5137 Other intervertebral disc degeneration, lumbosacral region: Secondary | ICD-10-CM | POA: Diagnosis not present

## 2013-12-25 DIAGNOSIS — M25559 Pain in unspecified hip: Secondary | ICD-10-CM | POA: Diagnosis not present

## 2013-12-26 DIAGNOSIS — E119 Type 2 diabetes mellitus without complications: Secondary | ICD-10-CM | POA: Diagnosis not present

## 2014-01-02 DIAGNOSIS — M545 Low back pain, unspecified: Secondary | ICD-10-CM | POA: Diagnosis not present

## 2014-01-10 DIAGNOSIS — M545 Low back pain, unspecified: Secondary | ICD-10-CM | POA: Diagnosis not present

## 2014-02-12 DIAGNOSIS — R972 Elevated prostate specific antigen [PSA]: Secondary | ICD-10-CM | POA: Diagnosis not present

## 2014-02-12 DIAGNOSIS — N529 Male erectile dysfunction, unspecified: Secondary | ICD-10-CM | POA: Diagnosis not present

## 2014-02-23 DIAGNOSIS — N401 Enlarged prostate with lower urinary tract symptoms: Secondary | ICD-10-CM | POA: Diagnosis not present

## 2014-02-23 DIAGNOSIS — R972 Elevated prostate specific antigen [PSA]: Secondary | ICD-10-CM | POA: Diagnosis not present

## 2014-02-23 DIAGNOSIS — R351 Nocturia: Secondary | ICD-10-CM | POA: Diagnosis not present

## 2014-02-23 DIAGNOSIS — N138 Other obstructive and reflux uropathy: Secondary | ICD-10-CM | POA: Diagnosis not present

## 2014-04-09 DIAGNOSIS — E119 Type 2 diabetes mellitus without complications: Secondary | ICD-10-CM | POA: Diagnosis not present

## 2014-04-09 DIAGNOSIS — Z23 Encounter for immunization: Secondary | ICD-10-CM | POA: Diagnosis not present

## 2014-04-09 DIAGNOSIS — Z1331 Encounter for screening for depression: Secondary | ICD-10-CM | POA: Diagnosis not present

## 2014-04-09 DIAGNOSIS — I10 Essential (primary) hypertension: Secondary | ICD-10-CM | POA: Diagnosis not present

## 2014-04-09 DIAGNOSIS — Z0000 Encounter for general adult medical examination without abnormal findings: Secondary | ICD-10-CM | POA: Diagnosis not present

## 2014-04-09 DIAGNOSIS — E7800 Pure hypercholesterolemia, unspecified: Secondary | ICD-10-CM | POA: Diagnosis not present

## 2014-04-09 DIAGNOSIS — Z79899 Other long term (current) drug therapy: Secondary | ICD-10-CM | POA: Diagnosis not present

## 2014-06-28 DIAGNOSIS — H01021 Squamous blepharitis right upper eyelid: Secondary | ICD-10-CM | POA: Diagnosis not present

## 2014-06-28 DIAGNOSIS — H0014 Chalazion left upper eyelid: Secondary | ICD-10-CM | POA: Diagnosis not present

## 2014-06-28 DIAGNOSIS — H01022 Squamous blepharitis right lower eyelid: Secondary | ICD-10-CM | POA: Diagnosis not present

## 2014-06-28 DIAGNOSIS — D2312 Other benign neoplasm of skin of left eyelid, including canthus: Secondary | ICD-10-CM | POA: Diagnosis not present

## 2014-06-28 DIAGNOSIS — H2513 Age-related nuclear cataract, bilateral: Secondary | ICD-10-CM | POA: Diagnosis not present

## 2014-06-28 DIAGNOSIS — H01024 Squamous blepharitis left upper eyelid: Secondary | ICD-10-CM | POA: Diagnosis not present

## 2014-06-28 DIAGNOSIS — H01025 Squamous blepharitis left lower eyelid: Secondary | ICD-10-CM | POA: Diagnosis not present

## 2014-06-28 DIAGNOSIS — H04123 Dry eye syndrome of bilateral lacrimal glands: Secondary | ICD-10-CM | POA: Diagnosis not present

## 2014-07-19 DIAGNOSIS — H01022 Squamous blepharitis right lower eyelid: Secondary | ICD-10-CM | POA: Diagnosis not present

## 2014-07-19 DIAGNOSIS — H01021 Squamous blepharitis right upper eyelid: Secondary | ICD-10-CM | POA: Diagnosis not present

## 2014-07-19 DIAGNOSIS — H0014 Chalazion left upper eyelid: Secondary | ICD-10-CM | POA: Diagnosis not present

## 2014-07-19 DIAGNOSIS — H01025 Squamous blepharitis left lower eyelid: Secondary | ICD-10-CM | POA: Diagnosis not present

## 2014-07-19 DIAGNOSIS — H01024 Squamous blepharitis left upper eyelid: Secondary | ICD-10-CM | POA: Diagnosis not present

## 2014-07-19 DIAGNOSIS — H2513 Age-related nuclear cataract, bilateral: Secondary | ICD-10-CM | POA: Diagnosis not present

## 2014-08-03 IMAGING — CR DG LUMBAR SPINE 2-3V
3 series · 3 of 3 positions shown · non-contrast
Comparison: Abdominal and pelvic CT scan January 06, 2013

CLINICAL DATA: Low back and left hip pain for several weeks without
history of trauma

EXAM:
LUMBAR SPINE - 2-3 VIEW

[t l-spine a.p.]
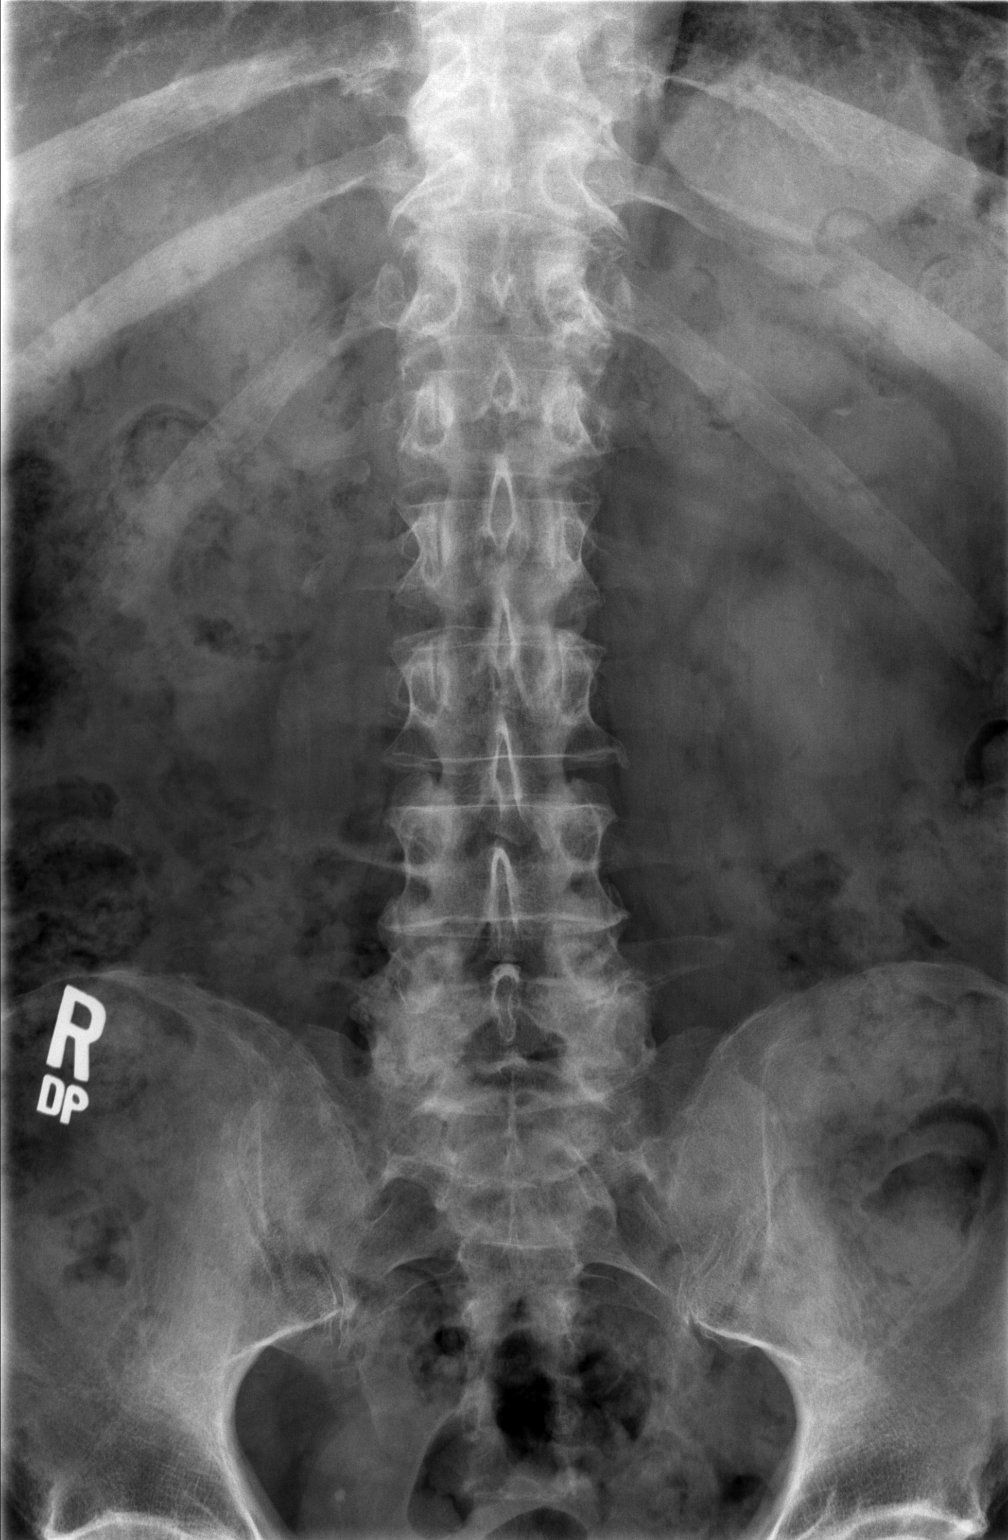

[t l-spine lat]
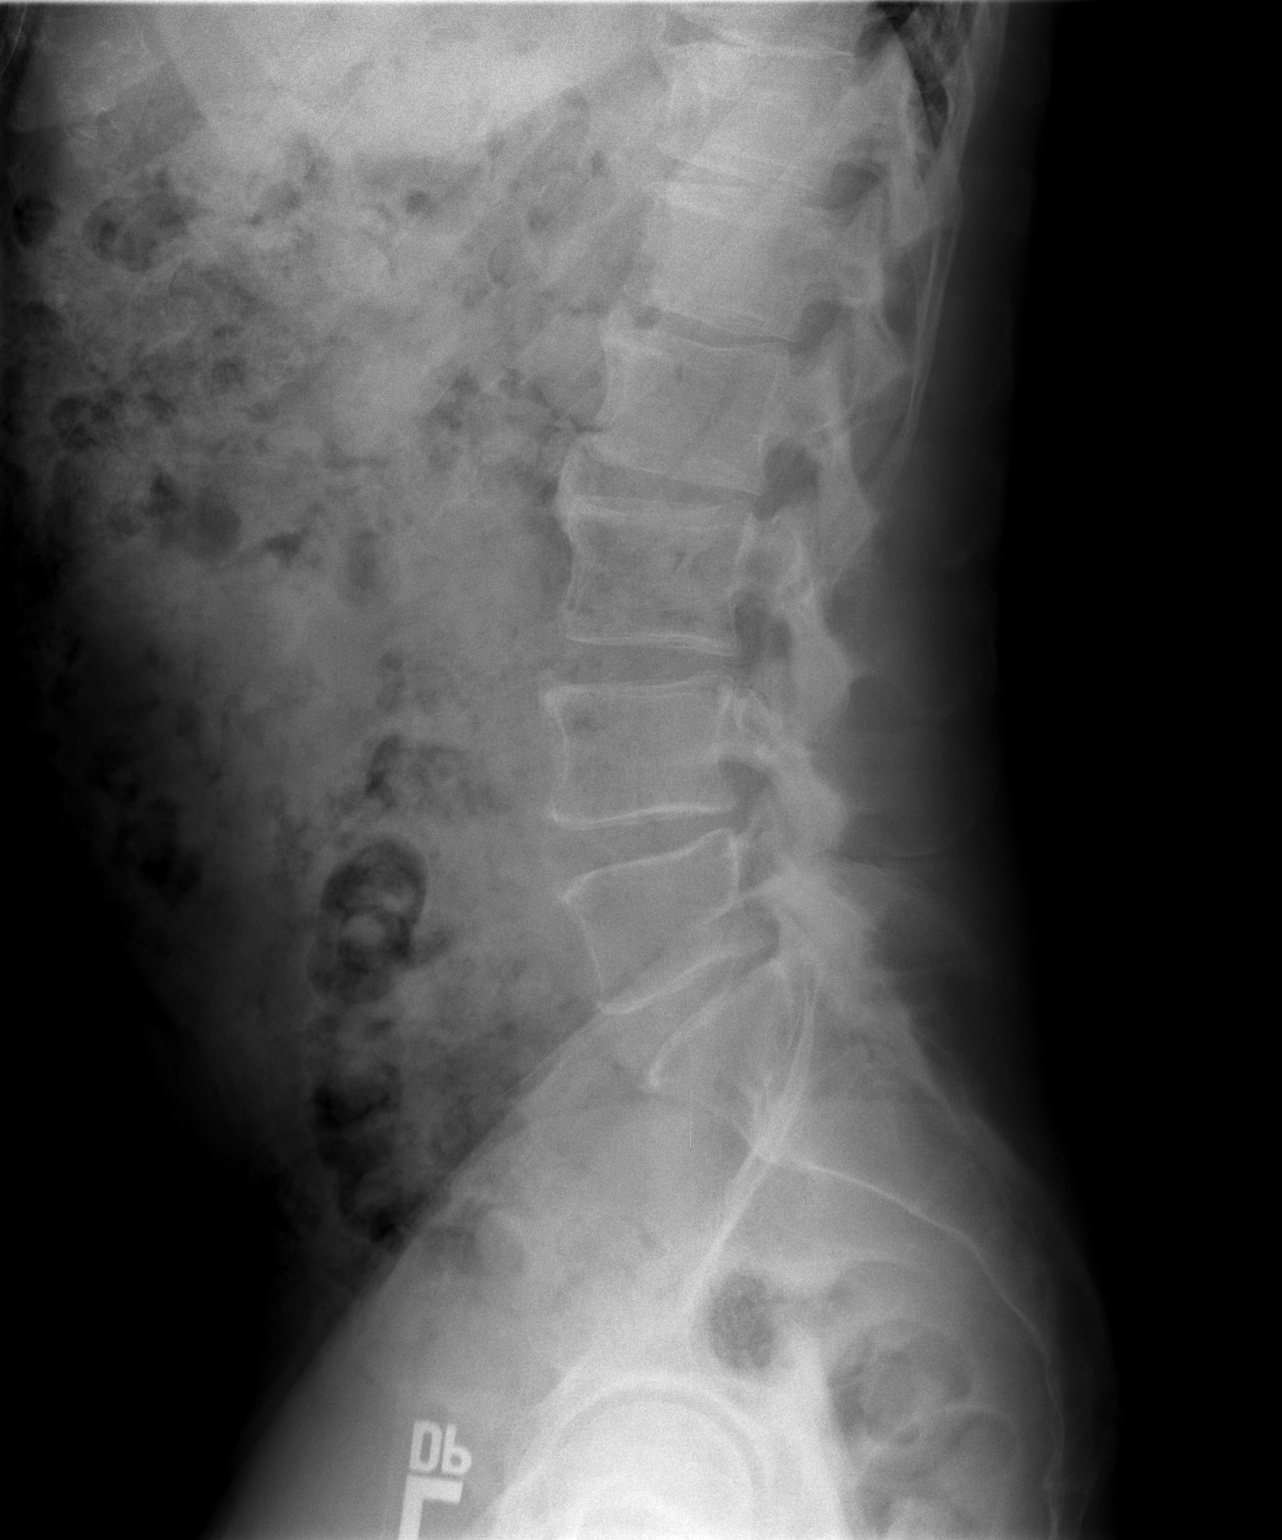

[t l-spine l5-s1 spot]
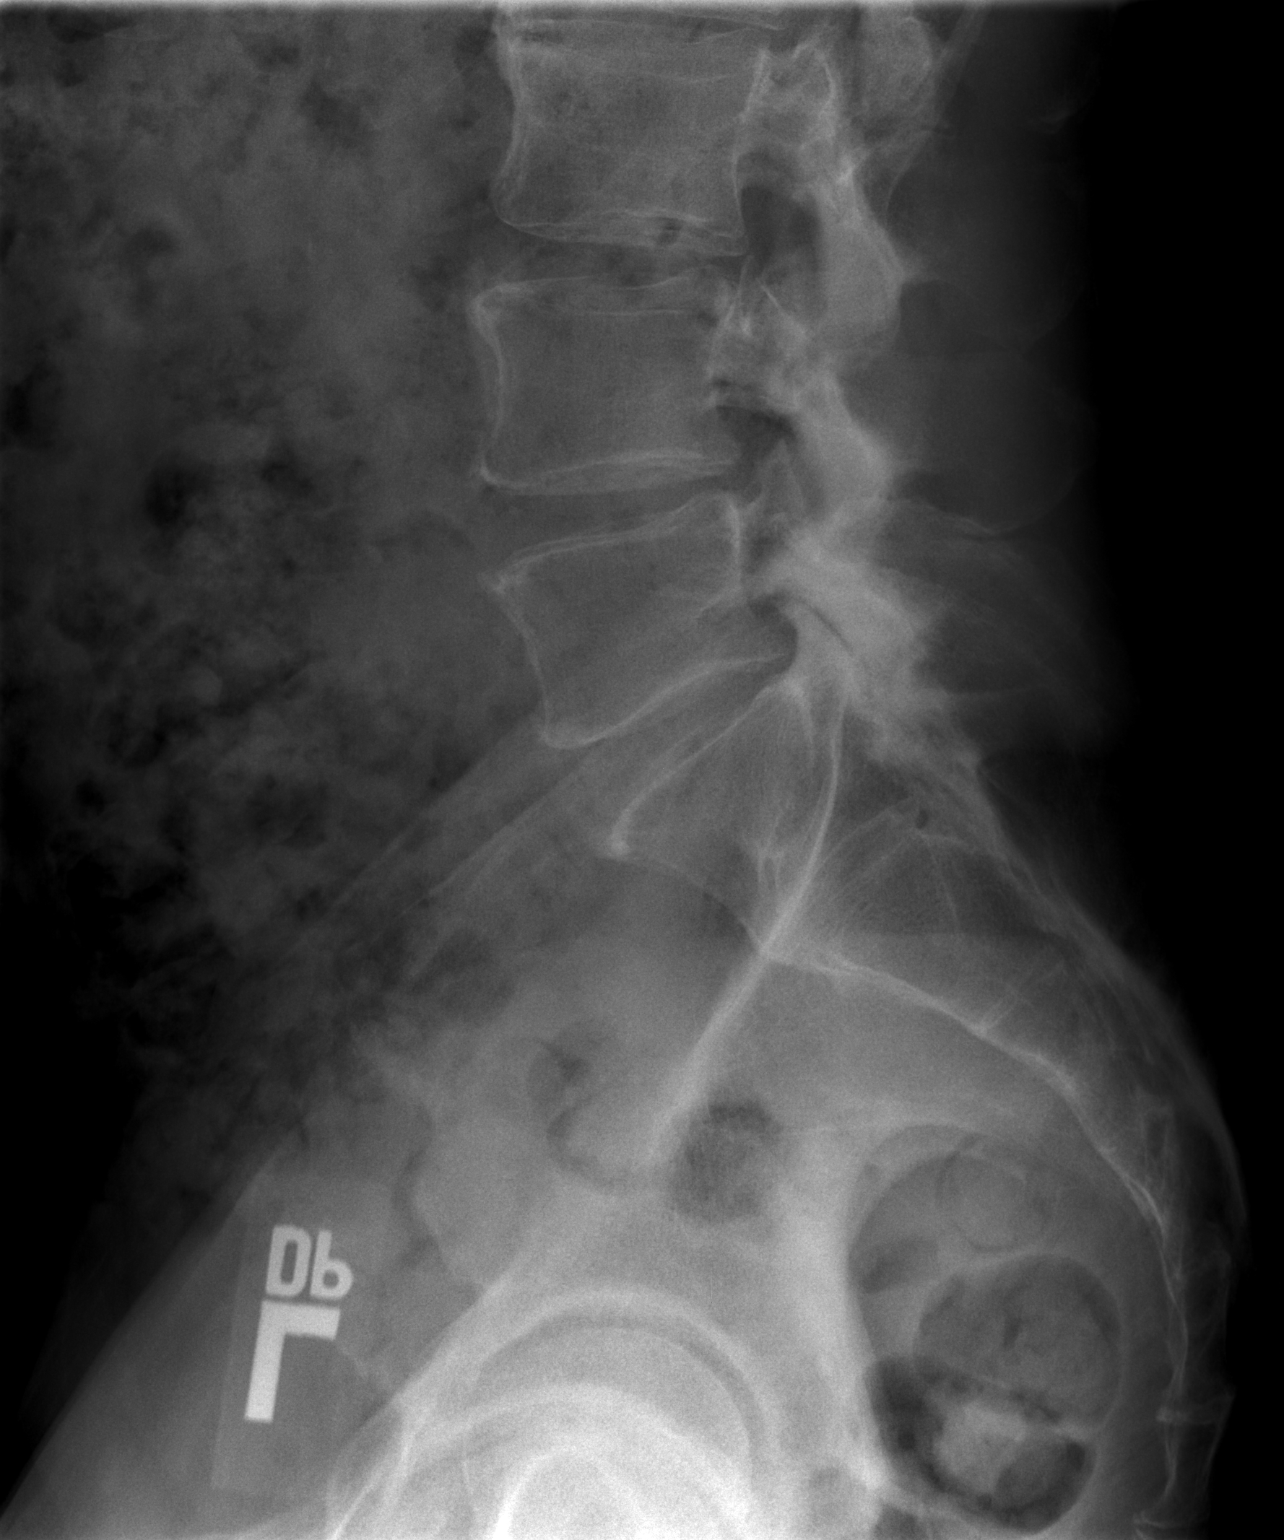

[3 of 3 positions shown; findings below may reference images not displayed]

FINDINGS: The lumbar vertebral bodies are preserved in height. The
intervertebral disc space heights are reasonably well maintained.
There is moderate facet joint degenerative change at L4-5 and at
L5-S1. The pedicles and transverse processes are intact. The
observed portions of the sacrum are normal.
IMPRESSION: There is moderate facet joint degenerative change of the lower
lumbar spine. There is no compression fracture nor high-grade disc
space narrowing.

## 2014-08-29 DIAGNOSIS — R3915 Urgency of urination: Secondary | ICD-10-CM | POA: Diagnosis not present

## 2014-08-29 DIAGNOSIS — R7302 Impaired glucose tolerance (oral): Secondary | ICD-10-CM | POA: Diagnosis not present

## 2014-08-29 DIAGNOSIS — R81 Glycosuria: Secondary | ICD-10-CM | POA: Diagnosis not present

## 2014-08-29 DIAGNOSIS — N401 Enlarged prostate with lower urinary tract symptoms: Secondary | ICD-10-CM | POA: Diagnosis not present

## 2014-08-29 DIAGNOSIS — R312 Other microscopic hematuria: Secondary | ICD-10-CM | POA: Diagnosis not present

## 2014-08-29 DIAGNOSIS — R972 Elevated prostate specific antigen [PSA]: Secondary | ICD-10-CM | POA: Diagnosis not present

## 2014-09-10 DIAGNOSIS — N400 Enlarged prostate without lower urinary tract symptoms: Secondary | ICD-10-CM | POA: Diagnosis not present

## 2014-09-10 DIAGNOSIS — K868 Other specified diseases of pancreas: Secondary | ICD-10-CM | POA: Diagnosis not present

## 2014-09-10 DIAGNOSIS — N132 Hydronephrosis with renal and ureteral calculous obstruction: Secondary | ICD-10-CM | POA: Diagnosis not present

## 2014-09-10 DIAGNOSIS — R312 Other microscopic hematuria: Secondary | ICD-10-CM | POA: Diagnosis not present

## 2014-09-12 DIAGNOSIS — R312 Other microscopic hematuria: Secondary | ICD-10-CM | POA: Diagnosis not present

## 2014-09-12 DIAGNOSIS — N200 Calculus of kidney: Secondary | ICD-10-CM | POA: Diagnosis not present

## 2014-09-12 DIAGNOSIS — N201 Calculus of ureter: Secondary | ICD-10-CM | POA: Diagnosis not present

## 2014-09-14 ENCOUNTER — Other Ambulatory Visit: Payer: Self-pay | Admitting: Urology

## 2014-09-18 ENCOUNTER — Encounter (HOSPITAL_COMMUNITY): Payer: Self-pay | Admitting: *Deleted

## 2014-09-23 NOTE — H&P (Signed)
Reason For Visit 2 week f/u, KUB & review CT results   Active Problems Problems  1. Benign prostatic hyperplasia with urinary obstruction (N40.1)   Assessed By: Carolan Clines (Urology); Last Assessed: 29 Aug 2014 2. Elevated prostate specific antigen (PSA) (R97.2)   Assessed By: Carolan Clines (Urology); Last Assessed: 29 Aug 2014 3. Erectile dysfunction due to arterial insufficiency (N52.01) 4. Microhematuria (R31.2)   Assessed By: Carolan Clines (Urology); Last Assessed: 12 Sep 2014 5. Nocturia (R35.1)  History of Present Illness     74 yo married diabetic male returns today for a 2 week f/u, KUB & review CT results. Hx of BPH on Rapaflo, elevated PSA & ED. He has a long history of bladder outlet obstruction. He has been voiding fairly well. He has some frequency, difficulty postponing & nocturia x 3. He denies incontinence, dysuria or hematuria. IPSS currently =19. Wt loss 25 lbs in 1 year.       He does have a long history of PSA elevations and prior biopsies (last biopsy 1995). His PSA has been in a 6 to 8 range for several years.  He is doing better sexually at this time.    08/23/14 4K - 2% (1 in 50 men biopsied would have an aggressive prostate cancer), PSA - 6.5/24%  02/12/14 PSA - 7.35/25%  01/09/13 PSA - 6.47/25%  10/20/11 PSA - 8.63/26%  07/03/11 PSA - 8.12/28%   Past Medical History Problems  1. History of Cancer 2. History of diabetes mellitus (Z86.39) 3. History of hypercholesterolemia (Z86.39) 4. History of hypertension (Z86.79)  Surgical History Problems  1. History of Shoulder Surgery 2. History of Tonsillectomy  Current Meds 1. Aspirin 81 MG Oral Tablet;  Therapy: (Recorded:18Sep2008) to Recorded 2. Coenzyme Q10 CAPS;  Therapy: (POEUMPNT:61WER1540) to Recorded 3. Glimepiride TABS;  Therapy: (GQQPYPPJ:09TOI7124) to Recorded 4. Lisinopril TABS;  Therapy: (Recorded:18Sep2008) to Recorded 5. MetFORMIN HCl TABS;   Therapy: (PYKDXIPJ:82NKN3976) to Recorded 6. Multi-Vitamin TABS;  Therapy: (Recorded:18Sep2008) to Recorded 7. Rapaflo 8 MG Oral Capsule; TAKE 1 CAPSULE DAILY WITH FOOD;  Therapy: [id number] to (Evaluate:09Mar2016)  Requested for:  24OXB3532; Last Rx:10Dec2015 Ordered 8. Vytorin TABS;  Therapy: (DJMEQAST:41DQQ2297) to Recorded  Allergies Medication  1. Vicodin TABS  Family History Problems  1. Family history of Family Health Status - Father's Age : Father   Old AgeAge 54 2. Family history of Family Health Status - Mother's Age : Mother 3. Family history of Family Health Status Number Of Children   3 sons  Social History Problems  1. Denied: History of Alcohol Use 2. Daily Coffee Consumption (___ Cups/Day)   1 cup per day 3. Daily Tea Consumption (___ Cups/Day)   1 cup per day 4. Marital History - Currently Married 5. Never A Smoker 6. Occupation:   [employer] 7. Denied: History of Tobacco Use  Review of Systems  Genitourinary: urinary frequency, feelings of urinary urgency and nocturia.  Musculoskeletal: joint pain.    Vitals Vital Signs [Data Includes: Last 1 Day]  Recorded: [id number] 02:14PM  Blood Pressure: 147 / 78 Temperature: 97.8 F Heart Rate: 75  Physical Exam Constitutional: Well nourished and well developed . No acute distress.  ENT:. The ears and nose are normal in appearance.  Neck: The appearance of the neck is normal and no neck mass is present.  Pulmonary: No respiratory distress and normal respiratory rhythm and effort.  Cardiovascular: Heart rate and rhythm are normal . No  peripheral edema.  Abdomen: The abdomen is soft and nontender. No masses are palpated. No CVA tenderness. No hernias are palpable. No hepatosplenomegaly noted.  Genitourinary: Examination of the penis demonstrates no discharge, no masses, no lesions and a normal meatus. The penis is circumcised. The scrotum is without lesions. The right epididymis is palpably normal and  non-tender. The left epididymis is palpably normal and non-tender. The right testis is non-tender and without masses. The left testis is non-tender and without masses.  Lymphatics: The femoral and inguinal nodes are not enlarged or tender.  Skin: Normal skin turgor, no visible rash and no visible skin lesions.  Neuro/Psych:. Mood and affect are appropriate.    Results/Data Urine [Data Includes: Last 1 Day]   [id number]  COLOR YELLOW   APPEARANCE CLEAR   SPECIFIC GRAVITY 1.025   pH 5.0   GLUCOSE > 1000 mg/dL  BILIRUBIN NEG   KETONE NEG mg/dL  BLOOD MOD   PROTEIN NEG mg/dL  UROBILINOGEN 0.2 mg/dL  NITRITE NEG   LEUKOCYTE ESTERASE NEG   SQUAMOUS EPITHELIAL/HPF NONE SEEN   WBC NONE SEEN WBC/hpf  RBC 11-20 RBC/hpf  BACTERIA NONE SEEN   CRYSTALS NONE SEEN   CASTS NONE SEEN   Selected Results  AU CT-HEMATURIA PROTOCOL [id number] 12:00AM Carolan Clines   Test Name Result Flag Reference  AU CT-HEMATURIA PROTOCOL (Report)    ** RADIOLOGY REPORT BY [facility] **   CLINICAL DATA: Microhematuria.  EXAM: CT ABDOMEN AND PELVIS WITHOUT AND WITH CONTRAST  TECHNIQUE: Multidetector CT imaging of the abdomen and pelvis was performed following the standard protocol before and following the bolus administration of intravenous contrast.  CONTRAST: 125 cc Isovue 300.  COMPARISON: None.  FINDINGS: Lower chest: Lung bases show no acute findings. Heart is at the upper limits of normal in size to mildly enlarged. Tiny amount of pericardial fluid may be physiologic. No pleural effusion.  Hepatobiliary: 3 mm low-attenuation lesion in the left hepatic lobe is too small to characterize. Liver and gallbladder are otherwise unremarkable. No biliary ductal dilatation.  Pancreas: Low-attenuation lesions in the pancreas measure up to 1.5 cm in the tail. There are a few scattered calcifications in the pancreas. Pancreatic body and tail are somewhat atrophic. Pancreatic duct may  be minimally prominent in the body and tail, distal to a 4 mm stone (series 4, image 25).  Spleen: Negative.  Adrenals/Urinary Tract: Adrenal glands are unremarkable. Bilateral renal stones. Mild to moderate left hydronephrosis secondary to a 7 mm stone at the left ureteral pelvic junction. Ureters are otherwise decompressed. Low-attenuation lesions in the kidneys measure up to 1.3 cm on the left and are difficult to further characterize due to size. 9 mm lesion in the interpolar right kidney is somewhat intermediate in density (series 4, image 39). No additional filling defects in the intrarenal collecting systems or ureters. An enlarged prostate indents the bladder base. Bladder wall appears slightly thickened.  Stomach/Bowel: Stomach, small bowel and colon are unremarkable.  Vascular/Lymphatic: Prostate is markedly enlarged, measuring approximately 7.5 cm in greatest transaxial dimension.  Reproductive: Minimal atherosclerotic calcification of the arterial vasculature without abdominal aortic aneurysm. No pathologically enlarged lymph nodes.  Other: Small periumbilical hernia contains fat. No free fluid. Small amount of fluid is seen along the anterior margin of the right hip joint.  Musculoskeletal: No worrisome lytic or sclerotic lesions. Degenerative changes are seen in the spine.  IMPRESSION: 1. Mild to moderate left hydronephrosis secondary to 7 mm stone at the left ureteral pelvic  junction. 2. Bilateral renal stones. 3. Low-attenuation lesions in the kidneys are difficult to characterize due to size. 9 mm interpolar right renal lesion is intermediate in density. Followup CT abdomen without and with contrast could be performed in 6 months in further evaluation, as clinically indicated. Alternatively, if a more aggressive approach is desired, MR abdomen without and with contrast could be performed. 4. Atrophy of the pancreatic body and tail with  scattered calcifications, low-attenuation lesions and mild ductal prominence. Findings are likely the sequelae of pancreatitis. Cystic pancreatic neoplasm cannot be excluded. Follow-up MR abdomen without and with contrast in 1 year is recommended. This recommendation follows ACR consensus guidelines: Managing Incidental Findings on Abdominal CT: White Paper of the ACR Incidental Findings Committee. J Am Coll Radiol 2010;7:754-773. 5. Markedly enlarged prostate.   Electronically Signed  By: Lorin Picket M.D.  On: 09/10/2014 12:58   CREATININE with eGFR [id number] 11:18AM Carolan Clines  SPECIMEN TYPE: BLOOD   Test Name Result Flag Reference  CREATININE 0.86 mg/dL  0.50-1.50  Est GFR, African American >89 mL/min    Est GFR, NonAfrican American 87 mL/min    THE ESTIMATED GFR IS A CALCULATION VALID FOR ADULTS (>=53 YEARS OLD) THAT USES THE CKD-EPI ALGORITHM TO ADJUST FOR AGE AND SEX. IT IS   NOT TO BE USED FOR CHILDREN, PREGNANT WOMEN, HOSPITALIZED PATIENTS,    PATIENTS ON DIALYSIS, OR WITH RAPIDLY CHANGING KIDNEY FUNCTION. ACCORDING TO THE NKDEP, EGFR >89 IS NORMAL, 60-89 SHOWS MILD IMPAIRMENT, 30-59 SHOWS MODERATE IMPAIRMENT, 15-29 SHOWS SEVERE IMPAIRMENT AND <15 IS ESRD.    KUB: 32m L UPJ stone : no change in location. normal gas pattern. Normal ribs.  Assessment Assessed  1. Microhematuria (R31.2) 2. Bilateral kidney stones (N20.0) 3. Obstruction of left ureteropelvic junction (UPJ) due to stone (N20.1)  Microhematuria 2ndary 792mL UPJ stone. We have discussed alternatives for Rx, including lithotripsy vs ureteroscopy and laser of stone with stenting. he would like to continue with plans for lithotripsy. Pt has hydronephrosis, and microhematuria; but no gross hematuria, fever, flank pain or nausea/vomiting. he is going on a trip to [place] April , and wants lithotripsy before he goes.   Plan Bilateral kidney stones  1. KUB; Status:Resulted - Requires  Verification;   Done: : 09ZZC02212:00AM Health Maintenance  2. UA With REFLEX; [Do Not Release]; Status:Resulted - Requires  Verification;   Done: : [id number]:31PM  Schedule lithotripsy of 30m25m UPJ stone.   Discussion/Summary cc: Dr. StoFelipa Eth  Signatures Electronically signed by : SigCarolan Clines.D.; Sep 12 2014  2:47PM EST

## 2014-09-24 ENCOUNTER — Encounter (HOSPITAL_COMMUNITY)
Admission: RE | Disposition: A | Discharge status: Home / Self Care | Payer: Self-pay | Source: Ambulatory Visit | Attending: Urology

## 2014-09-24 ENCOUNTER — Ambulatory Visit (HOSPITAL_COMMUNITY)
Admission: RE | Admit: 2014-09-24 | Discharge: 2014-09-24 | Disposition: A | Discharge status: Home / Self Care | Payer: Medicare Other | Source: Ambulatory Visit | Attending: Urology | Admitting: Urology

## 2014-09-24 ENCOUNTER — Ambulatory Visit (HOSPITAL_COMMUNITY): Payer: Medicare Other

## 2014-09-24 ENCOUNTER — Encounter (HOSPITAL_COMMUNITY): Payer: Self-pay | Admitting: *Deleted

## 2014-09-24 DIAGNOSIS — E119 Type 2 diabetes mellitus without complications: Secondary | ICD-10-CM | POA: Diagnosis not present

## 2014-09-24 DIAGNOSIS — N132 Hydronephrosis with renal and ureteral calculous obstruction: Secondary | ICD-10-CM | POA: Diagnosis not present

## 2014-09-24 DIAGNOSIS — N201 Calculus of ureter: Secondary | ICD-10-CM | POA: Diagnosis not present

## 2014-09-24 DIAGNOSIS — Z79899 Other long term (current) drug therapy: Secondary | ICD-10-CM | POA: Insufficient documentation

## 2014-09-24 DIAGNOSIS — R351 Nocturia: Secondary | ICD-10-CM | POA: Insufficient documentation

## 2014-09-24 DIAGNOSIS — I10 Essential (primary) hypertension: Secondary | ICD-10-CM | POA: Insufficient documentation

## 2014-09-24 DIAGNOSIS — Z7982 Long term (current) use of aspirin: Secondary | ICD-10-CM | POA: Insufficient documentation

## 2014-09-24 DIAGNOSIS — N200 Calculus of kidney: Secondary | ICD-10-CM | POA: Diagnosis not present

## 2014-09-24 DIAGNOSIS — E780 Pure hypercholesterolemia: Secondary | ICD-10-CM | POA: Insufficient documentation

## 2014-09-24 DIAGNOSIS — R312 Other microscopic hematuria: Secondary | ICD-10-CM | POA: Insufficient documentation

## 2014-09-24 DIAGNOSIS — N400 Enlarged prostate without lower urinary tract symptoms: Secondary | ICD-10-CM | POA: Diagnosis not present

## 2014-09-24 HISTORY — DX: Calculus of kidney: N20.0

## 2014-09-24 HISTORY — DX: Malignant (primary) neoplasm, unspecified: C80.1

## 2014-09-24 LAB — GLUCOSE, CAPILLARY
GLUCOSE-CAPILLARY: 151 mg/dL — AB (ref 70–99)
Glucose-Capillary: 248 mg/dL — ABNORMAL HIGH (ref 70–99)

## 2014-09-24 SURGERY — LITHOTRIPSY, ESWL
Anesthesia: LOCAL | Laterality: Left

## 2014-09-24 MED ORDER — CIPROFLOXACIN HCL 500 MG PO TABS
500.0000 mg | ORAL_TABLET | ORAL | Status: AC
  Administered 2014-09-24: 500 mg via ORAL
  Filled 2014-09-24: qty 1

## 2014-09-24 MED ORDER — DIPHENHYDRAMINE HCL 25 MG PO CAPS
25.0000 mg | ORAL_CAPSULE | ORAL | Status: AC
  Administered 2014-09-24: 25 mg via ORAL
  Filled 2014-09-24: qty 1

## 2014-09-24 MED ORDER — DIAZEPAM 5 MG PO TABS
10.0000 mg | ORAL_TABLET | ORAL | Status: AC
  Administered 2014-09-24: 10 mg via ORAL
  Filled 2014-09-24: qty 2

## 2014-09-24 MED ORDER — DEXTROSE-NACL 5-0.45 % IV SOLN
INTRAVENOUS | Status: DC
  Administered 2014-09-24: 07:00:00 via INTRAVENOUS

## 2014-09-24 NOTE — Interval H&P Note (Signed)
History and Physical Interval Note:  09/24/2014 7:34 AM  Samuel Ramos  has presented today for surgery, with the diagnosis of left UPJ stone  The various methods of treatment have been discussed with the patient and family. After consideration of risks, benefits and other options for treatment, the patient has consented to  Procedure(s): LEFT EXTRACORPOREAL SHOCK WAVE LITHOTRIPSY (ESWL) (Left) as a surgical intervention .  The patient's history has been reviewed, patient examined, no change in status, stable for surgery.  I have reviewed the patient's chart and labs.  Questions were answered to the patient's satisfaction.     Samuel Ramos

## 2014-09-24 NOTE — Discharge Instructions (Signed)
Lithotripsy for Kidney Stones °Lithotripsy is a treatment that can sometimes help eliminate kidney stones and pain that they cause. A form of lithotripsy, also known as extracorporeal shock wave lithotripsy, is a nonsurgical procedure that helps your body rid itself of the kidney stone when it is too big to pass on its own. Extracorporeal shock wave lithotripsy is a method of crushing a kidney stone with shock waves. These shock waves pass through your body and are focused on your stone. They cause the kidney stones to crumble while still in the urinary tract. It is then easier for the smaller pieces of stone to pass in the urine. °Lithotripsy usually takes about an hour. It is done in a hospital, a lithotripsy center, or a mobile unit. It usually does not require an overnight stay. Your health care provider will instruct you on preparation for the procedure. Your health care provider will tell you what to expect afterward. °LET YOUR HEALTH CARE PROVIDER KNOW ABOUT: °· Any allergies you have. °· All medicines you are taking, including vitamins, herbs, eye drops, creams, and over-the-counter medicines. °· Previous problems you or members of your family have had with the use of anesthetics. °· Any blood disorders you have. °· Previous surgeries you have had. °· Medical conditions you have. °RISKS AND COMPLICATIONS °Generally, lithotripsy for kidney stones is a safe procedure. However, as with any procedure, complications can occur. Possible complications include: °· Infection. °· Bleeding of the kidney. °· Bruising of the kidney or skin. °· Obstruction of the ureter. °· Failure of the stone to fragment. °BEFORE THE PROCEDURE °· Do not eat or drink for 6-8 hours prior to the procedure. You may, however, take the medications with a sip of water that your physician instructs you to take °· Do not take aspirin or aspirin-containing products for 7 days prior to your procedure °· Do not take nonsteroidal anti-inflammatory  products for 7 days prior to your procedure °PROCEDURE °A stent (flexible tube with holes) may be placed in your ureter. The ureter is the tube that transports the urine from the kidneys to the bladder. Your health care provider may place a stent before the procedure. This will help keep urine flowing from the kidney if the fragments of the stone block the ureter. You may have an IV tube placed in one of your veins to give you fluids and medicines. These medicines may help you relax or make you sleep. During the procedure, you will lie comfortably on a fluid-filled cushion or in a warm-water bath. After an X-ray or ultrasound exam to locate your stone, shock waves are aimed at the stone. If you are awake, you may feel a tapping sensation as the shock waves pass through your body. If large stone particles remain after treatment, a second procedure may be necessary at a later date. °For comfort during the test: °· Relax as much as possible. °· Try to remain still as much as possible. °· Try to follow instructions to speed up the test. °· Let your health care provider know if you are uncomfortable, anxious, or in pain. °AFTER THE PROCEDURE  °After surgery, you will be taken to the recovery area. A nurse will watch and check your progress. Once you're awake, stable, and taking fluids well, you will be allowed to go home as long as there are no problems. You will also be allowed to pass your urine before discharge. You may be given antibiotics to help prevent infection. You may also be prescribed   pain medicine if needed. In a week or two, your health care provider may remove your stent, if you have one. You may first have an X-ray exam to check on how successful the fragmentation of your stone has been and how much of the stone has passed. Your health care provider will check to see whether or not stone particles remain. °SEEK IMMEDIATE MEDICAL CARE IF: °· You develop a fever or shaking chills. °· Your pain is not  relieved by medicine. °· You feel sick to your stomach (nauseated) and you vomit. °· You develop heavy bleeding. °· You have difficulty urinating. °· You start to pass your stent from your penis. °Document Released: 06/26/2000 Document Revised: 04/19/2013 Document Reviewed: 01/12/2013 °Exit[employer]® Patient Information ©2015 [employer]. This information is not intended to replace advice given to you by your health care provider. Make sure you discuss any questions you have with your health care provider. ° °

## 2014-09-26 DIAGNOSIS — E1165 Type 2 diabetes mellitus with hyperglycemia: Secondary | ICD-10-CM | POA: Diagnosis not present

## 2014-09-26 DIAGNOSIS — I10 Essential (primary) hypertension: Secondary | ICD-10-CM | POA: Diagnosis not present

## 2014-09-26 DIAGNOSIS — R0602 Shortness of breath: Secondary | ICD-10-CM | POA: Diagnosis not present

## 2014-10-04 DIAGNOSIS — N201 Calculus of ureter: Secondary | ICD-10-CM | POA: Diagnosis not present

## 2014-10-10 ENCOUNTER — Ambulatory Visit: Payer: Medicare Other | Admitting: Cardiology

## 2014-10-16 DIAGNOSIS — R339 Retention of urine, unspecified: Secondary | ICD-10-CM | POA: Diagnosis not present

## 2014-10-16 DIAGNOSIS — N401 Enlarged prostate with lower urinary tract symptoms: Secondary | ICD-10-CM | POA: Diagnosis not present

## 2014-10-16 DIAGNOSIS — N138 Other obstructive and reflux uropathy: Secondary | ICD-10-CM | POA: Diagnosis not present

## 2014-10-17 ENCOUNTER — Other Ambulatory Visit: Payer: Self-pay

## 2014-10-17 ENCOUNTER — Ambulatory Visit (HOSPITAL_COMMUNITY)
Admission: RE | Admit: 2014-10-17 | Discharge: 2014-10-17 | Disposition: A | Discharge status: Home / Self Care | Payer: Medicare Other | Source: Ambulatory Visit | Attending: Cardiology | Admitting: Cardiology

## 2014-10-17 ENCOUNTER — Ambulatory Visit: Payer: Medicare Other | Attending: Cardiology | Admitting: Cardiology

## 2014-10-17 ENCOUNTER — Encounter: Payer: Self-pay | Admitting: Cardiology

## 2014-10-17 VITALS — Temp 98.1°F | Resp 18 | Ht 76.0 in | Wt 197.0 lb

## 2014-10-17 DIAGNOSIS — E782 Mixed hyperlipidemia: Secondary | ICD-10-CM | POA: Diagnosis not present

## 2014-10-17 DIAGNOSIS — Z7982 Long term (current) use of aspirin: Secondary | ICD-10-CM | POA: Insufficient documentation

## 2014-10-17 DIAGNOSIS — E119 Type 2 diabetes mellitus without complications: Secondary | ICD-10-CM | POA: Diagnosis not present

## 2014-10-17 DIAGNOSIS — R262 Difficulty in walking, not elsewhere classified: Secondary | ICD-10-CM | POA: Insufficient documentation

## 2014-10-17 DIAGNOSIS — R42 Dizziness and giddiness: Secondary | ICD-10-CM | POA: Insufficient documentation

## 2014-10-17 DIAGNOSIS — Z85820 Personal history of malignant melanoma of skin: Secondary | ICD-10-CM | POA: Insufficient documentation

## 2014-10-17 DIAGNOSIS — Q651 Congenital dislocation of hip, bilateral: Secondary | ICD-10-CM | POA: Insufficient documentation

## 2014-10-17 DIAGNOSIS — Z79899 Other long term (current) drug therapy: Secondary | ICD-10-CM | POA: Diagnosis not present

## 2014-10-17 MED ORDER — ASPIRIN EC 81 MG PO TBEC: 81.0000 mg | DELAYED_RELEASE_TABLET | Freq: Every day | ORAL | Status: DC

## 2014-10-17 NOTE — Progress Notes (Signed)
HPI Samuel Ramos is a 74 year old married white male comes today self-referred for the evaluation of some lightheadedness while hiking. Paragraph he was hiking a new treadmill recently. When going downhill he would fill somewhat giddy and lightheaded. When he stopped walking this would disappear. It is not true vertigo. There were no other associated symptoms including chest discomfort or shortness of breath. He did not have any of these symptoms when he was walking downhill. He's had no symptoms to suggest exertional angina or ischemia.   He has not been exercising much lately. He admits to being a bit out of shape are deconditioned. He also has dislocated hip since birth and walks "like a duck" as he puts it.paragraph his cardiac risk factors include male sex, age, type II non-insulin-dependent diabetes without complications, and mixed hyperlipidemia.  Past Medical History  Diagnosis Date  . Diabetes mellitus without complication   . Renal stone   . Cancer     melanoma let arm removed    Current Outpatient Prescriptions  Medication Sig Dispense Refill  . Coenzyme Q10 (COQ10 PO) Take 1 tablet by mouth daily.    Marland Kitchen ezetimibe-simvastatin (VYTORIN) 10-80 MG per tablet Take 0.5 tablets by mouth at bedtime.    Marland Kitchen glimepiride (AMARYL) 4 MG tablet Take 4 mg by mouth 2 (two) times daily.    . Ibuprofen-Diphenhydramine HCl 200-25 MG CAPS Take 1 tablet by mouth at bedtime.    . metFORMIN (GLUCOPHAGE) 500 MG tablet Take 1,000 mg by mouth 2 (two) times daily with a meal.    . silodosin (RAPAFLO) 8 MG CAPS capsule Take 8 mg by mouth daily with breakfast.    . sitaGLIPtin (JANUVIA) 100 MG tablet Take 100 mg by mouth daily.    Marland Kitchen aspirin EC 81 MG tablet Take 1 tablet (81 mg total) by mouth daily. 30 tablet 11  . traMADol (ULTRAM) 50 MG tablet Take 1 tablet (50 mg total) by mouth every 6 (six) hours as needed. (Patient not taking: Reported on 09/18/2014) 10 tablet 0   No current facility-administered medications  for this visit.    Allergies  Allergen Reactions  . Vicodin [Hydrocodone-Acetaminophen] Other (See Comments)    Nightmares    History reviewed. No pertinent family history.  History   Social History  . Marital Status: Married    Spouse Name: N/A  . Number of Children: N/A  . Years of Education: N/A   Occupational History  . Not on file.   Social History Main Topics  . Smoking status: Never Smoker   . Smokeless tobacco: Not on file  . Alcohol Use: Yes     Comment: 1 per week  . Drug Use: Not on file  . Sexual Activity: Not on file   Other Topics Concern  . Not on file   Social History Narrative    ROS ALL NEGATIVE EXCEPT THOSE NOTED IN HPI  PE  General Appearance: well developed, well nourished in no acute distress HEENT: symmetrical face, PERRLA, good dentition  Neck: no JVD, thyromegaly, or adenopathy, trachea midline Chest: symmetric without deformity Cardiac: PMI non-displaced, RRR, normal S1, S2, no gallop or murmur Lung: clear to ausculation and percussion Vascular: all pulses full without bruits  Extremities: no cyanosis, clubbing or edema, no sign of DVT, no varicosities  Skin: normal color, no rashes Neuro: alert and oriented x 3, non-focal Pysch: normal affect  EKG Normal sinus rhythm, normal EKG BMET No results found for: NA, K, CL, CO2, GLUCOSE, BUN, CREATININE, CALCIUM,  GFRNONAA, GFRAA  Lipid Panel  No results found for: CHOL, TRIG, HDL, CHOLHDL, VLDL, LDLCALC  CBC No results found for: WBC, RBC, HGB, HCT, PLT, MCV, MCH, MCHC, RDW, LYMPHSABS, MONOABS, EOSABS, BASOSABS

## 2014-10-17 NOTE — Assessment & Plan Note (Signed)
I reassured him I did not think this was cardiac related. I think is most likely secondary to some deconditioning and difficulty walking with his dislocated hips downhill. I've asked continued exercise and get stronger in his legs. This should improve this. It also does not sound neurological.  I did talk to him about angina in  his future with his risk factors. I explained to him that if he has exertional angina to contact me. Otherwise no followup recommended.

## 2014-10-17 NOTE — Progress Notes (Signed)
Patient here for cardiac work-up. Patient indicates he recently went on a hike and had shortness of breath and difficulty keeping up. History of type 2 diabetes mellitus. PCP Dr. Felipa Eth with [facility]. Works out 3x/week, eats healthy diet.

## 2014-10-25 DIAGNOSIS — E119 Type 2 diabetes mellitus without complications: Secondary | ICD-10-CM | POA: Diagnosis not present

## 2014-11-20 DIAGNOSIS — N401 Enlarged prostate with lower urinary tract symptoms: Secondary | ICD-10-CM | POA: Diagnosis not present

## 2014-11-20 DIAGNOSIS — R339 Retention of urine, unspecified: Secondary | ICD-10-CM | POA: Diagnosis not present

## 2014-11-20 DIAGNOSIS — R972 Elevated prostate specific antigen [PSA]: Secondary | ICD-10-CM | POA: Diagnosis not present

## 2014-11-20 DIAGNOSIS — R3915 Urgency of urination: Secondary | ICD-10-CM | POA: Diagnosis not present

## 2014-11-22 ENCOUNTER — Other Ambulatory Visit: Payer: Self-pay | Admitting: Urology

## 2014-12-03 DIAGNOSIS — M6281 Muscle weakness (generalized): Secondary | ICD-10-CM | POA: Diagnosis not present

## 2014-12-03 DIAGNOSIS — N401 Enlarged prostate with lower urinary tract symptoms: Secondary | ICD-10-CM | POA: Diagnosis not present

## 2014-12-03 DIAGNOSIS — N138 Other obstructive and reflux uropathy: Secondary | ICD-10-CM | POA: Diagnosis not present

## 2014-12-04 DIAGNOSIS — L989 Disorder of the skin and subcutaneous tissue, unspecified: Secondary | ICD-10-CM | POA: Diagnosis not present

## 2014-12-04 DIAGNOSIS — N400 Enlarged prostate without lower urinary tract symptoms: Secondary | ICD-10-CM | POA: Diagnosis not present

## 2014-12-04 DIAGNOSIS — I10 Essential (primary) hypertension: Secondary | ICD-10-CM | POA: Diagnosis not present

## 2014-12-04 DIAGNOSIS — W57XXXA Bitten or stung by nonvenomous insect and other nonvenomous arthropods, initial encounter: Secondary | ICD-10-CM | POA: Diagnosis not present

## 2014-12-04 DIAGNOSIS — E1165 Type 2 diabetes mellitus with hyperglycemia: Secondary | ICD-10-CM | POA: Diagnosis not present

## 2014-12-05 DIAGNOSIS — N419 Inflammatory disease of prostate, unspecified: Secondary | ICD-10-CM | POA: Diagnosis not present

## 2014-12-06 DIAGNOSIS — D485 Neoplasm of uncertain behavior of skin: Secondary | ICD-10-CM | POA: Diagnosis not present

## 2014-12-06 DIAGNOSIS — D1801 Hemangioma of skin and subcutaneous tissue: Secondary | ICD-10-CM | POA: Diagnosis not present

## 2014-12-06 DIAGNOSIS — D2261 Melanocytic nevi of right upper limb, including shoulder: Secondary | ICD-10-CM | POA: Diagnosis not present

## 2014-12-06 DIAGNOSIS — D2272 Melanocytic nevi of left lower limb, including hip: Secondary | ICD-10-CM | POA: Diagnosis not present

## 2014-12-06 DIAGNOSIS — D2271 Melanocytic nevi of right lower limb, including hip: Secondary | ICD-10-CM | POA: Diagnosis not present

## 2014-12-06 DIAGNOSIS — D2262 Melanocytic nevi of left upper limb, including shoulder: Secondary | ICD-10-CM | POA: Diagnosis not present

## 2014-12-06 DIAGNOSIS — D225 Melanocytic nevi of trunk: Secondary | ICD-10-CM | POA: Diagnosis not present

## 2014-12-06 DIAGNOSIS — Z85820 Personal history of malignant melanoma of skin: Secondary | ICD-10-CM | POA: Diagnosis not present

## 2014-12-06 DIAGNOSIS — D224 Melanocytic nevi of scalp and neck: Secondary | ICD-10-CM | POA: Diagnosis not present

## 2014-12-06 DIAGNOSIS — L821 Other seborrheic keratosis: Secondary | ICD-10-CM | POA: Diagnosis not present

## 2014-12-13 DIAGNOSIS — N401 Enlarged prostate with lower urinary tract symptoms: Secondary | ICD-10-CM | POA: Diagnosis not present

## 2014-12-13 DIAGNOSIS — N138 Other obstructive and reflux uropathy: Secondary | ICD-10-CM | POA: Diagnosis not present

## 2014-12-13 DIAGNOSIS — M6281 Muscle weakness (generalized): Secondary | ICD-10-CM | POA: Diagnosis not present

## 2014-12-25 DIAGNOSIS — Z85828 Personal history of other malignant neoplasm of skin: Secondary | ICD-10-CM | POA: Diagnosis not present

## 2014-12-25 DIAGNOSIS — D485 Neoplasm of uncertain behavior of skin: Secondary | ICD-10-CM | POA: Diagnosis not present

## 2014-12-25 DIAGNOSIS — C4441 Basal cell carcinoma of skin of scalp and neck: Secondary | ICD-10-CM | POA: Diagnosis not present

## 2014-12-27 DIAGNOSIS — D2312 Other benign neoplasm of skin of left eyelid, including canthus: Secondary | ICD-10-CM | POA: Diagnosis not present

## 2014-12-27 DIAGNOSIS — H2513 Age-related nuclear cataract, bilateral: Secondary | ICD-10-CM | POA: Diagnosis not present

## 2014-12-27 DIAGNOSIS — E119 Type 2 diabetes mellitus without complications: Secondary | ICD-10-CM | POA: Diagnosis not present

## 2014-12-27 DIAGNOSIS — H40013 Open angle with borderline findings, low risk, bilateral: Secondary | ICD-10-CM | POA: Diagnosis not present

## 2015-01-02 DIAGNOSIS — R58 Hemorrhage, not elsewhere classified: Secondary | ICD-10-CM | POA: Diagnosis not present

## 2015-01-09 DIAGNOSIS — N201 Calculus of ureter: Secondary | ICD-10-CM | POA: Diagnosis not present

## 2015-01-09 DIAGNOSIS — N401 Enlarged prostate with lower urinary tract symptoms: Secondary | ICD-10-CM | POA: Diagnosis not present

## 2015-01-09 DIAGNOSIS — N200 Calculus of kidney: Secondary | ICD-10-CM | POA: Diagnosis not present

## 2015-01-09 DIAGNOSIS — R339 Retention of urine, unspecified: Secondary | ICD-10-CM | POA: Diagnosis not present

## 2015-01-09 DIAGNOSIS — N138 Other obstructive and reflux uropathy: Secondary | ICD-10-CM | POA: Diagnosis not present

## 2015-01-15 ENCOUNTER — Other Ambulatory Visit (HOSPITAL_COMMUNITY): Payer: Self-pay | Admitting: *Deleted

## 2015-01-15 NOTE — Patient Instructions (Addendum)
Samuel Ramos  01/15/2015   Your procedure is scheduled on: Monday January 21, 2015  Report to [facility]  Entrance take [place]  elevators to 3rd floor to  [place] at 700 AM.  Call this number if you have problems the morning of surgery [phone]   Remember: ONLY 1 PERSON MAY GO WITH YOU TO SHORT STAY TO GET  READY MORNING OF [place].  Do not eat food or drink liquids :After Midnight.     Take these medicines the morning of surgery with A SIP OF WATER: Proscar                               You may not have any metal on your body including hair pins and              piercings  Do not wear jewelry, make-up, lotions, powders or perfumes, deodorant             Do not wear nail polish.  Do not shave  48 hours prior to surgery.              Men may shave face and neck.   Do not bring valuables to the hospital. [place].  Contacts, dentures or bridgework may not be worn into surgery.  Leave suitcase in the car. After surgery it may be brought to your room.   _____________________________________________________________________             [facility] - Preparing for Surgery Before surgery, you can play an important role.  Because skin is not sterile, your skin needs to be as free of germs as possible.  You can reduce the number of germs on your skin by washing with CHG (chlorahexidine gluconate) soap before surgery.  CHG is an antiseptic cleaner which kills germs and bonds with the skin to continue killing germs even after washing. Please DO NOT use if you have an allergy to CHG or antibacterial soaps.  If your skin becomes reddened/irritated stop using the CHG and inform your nurse when you arrive at Short Stay. Do not shave (including legs and underarms) for at least 48 hours prior to the first CHG shower.  You may shave your face/neck. Please follow these instructions carefully:  1.  Shower with  CHG Soap the night before surgery and the  morning of Surgery.  2.  If you choose to wash your hair, wash your hair first as usual with your  normal  shampoo.  3.  After you shampoo, rinse your hair and body thoroughly to remove the  shampoo.                           4.  Use CHG as you would any other liquid soap.  You can apply chg directly  to the skin and wash                       Gently with a scrungie or clean washcloth.  5.  Apply the CHG Soap to your body ONLY FROM THE NECK DOWN.   Do not use on face/ open  Wound or open sores. Avoid contact with eyes, ears mouth and genitals (private parts).                       Wash face,  Genitals (private parts) with your normal soap.             6.  Wash thoroughly, paying special attention to the area where your surgery  will be performed.  7.  Thoroughly rinse your body with warm water from the neck down.  8.  DO NOT shower/wash with your normal soap after using and rinsing off  the CHG Soap.                9.  Pat yourself dry with a clean towel.            10.  Wear clean pajamas.            11.  Place clean sheets on your bed the night of your first shower and do not  sleep with pets. Day of Surgery : Do not apply any lotions/deodorants the morning of surgery.  Please wear clean clothes to the hospital/surgery center.  FAILURE TO FOLLOW THESE INSTRUCTIONS MAY RESULT IN THE CANCELLATION OF YOUR SURGERY PATIENT SIGNATURE_________________________________  NURSE SIGNATURE__________________________________  ________________________________________________________________________    CLEAR LIQUID DIET   Foods Allowed                                                                     Foods Excluded  Coffee and tea, regular and decaf                             liquids that you cannot  Plain Jell-O in any flavor                                             see through such as: Fruit ices (not with fruit pulp)                                      milk, soups, orange juice  Iced Popsicles                                    All solid food Carbonated beverages, regular and diet                                    Cranberry, grape and apple juices Sports drinks like Gatorade Lightly seasoned clear broth or consume(fat free) Sugar, honey syrup  Sample Menu Breakfast                                Lunch  Supper Cranberry juice                    Beef broth                            Chicken broth Jell-O                                     Grape juice                           Apple juice Coffee or tea                        Jell-O                                      Popsicle                                                Coffee or tea                        Coffee or tea  _____________________________________________________________________

## 2015-01-15 NOTE — Progress Notes (Signed)
ekg 10-17-14 epic

## 2015-01-16 ENCOUNTER — Encounter (HOSPITAL_COMMUNITY): Payer: Self-pay

## 2015-01-16 ENCOUNTER — Encounter (HOSPITAL_COMMUNITY)
Admission: RE | Admit: 2015-01-16 | Discharge: 2015-01-16 | Disposition: A | Discharge status: Home / Self Care | Payer: Medicare Other | Source: Ambulatory Visit | Attending: Urology | Admitting: Urology

## 2015-01-16 DIAGNOSIS — N400 Enlarged prostate without lower urinary tract symptoms: Secondary | ICD-10-CM | POA: Insufficient documentation

## 2015-01-16 DIAGNOSIS — Z01812 Encounter for preprocedural laboratory examination: Secondary | ICD-10-CM | POA: Diagnosis not present

## 2015-01-16 LAB — CBC
HCT: 39.8 % (ref 39.0–52.0)
Hemoglobin: 13.6 g/dL (ref 13.0–17.0)
MCH: 29.3 pg (ref 26.0–34.0)
MCHC: 34.2 g/dL (ref 30.0–36.0)
MCV: 85.8 fL (ref 78.0–100.0)
PLATELETS: 184 10*3/uL (ref 150–400)
RBC: 4.64 MIL/uL (ref 4.22–5.81)
RDW: 13.2 % (ref 11.5–15.5)
WBC: 6.8 10*3/uL (ref 4.0–10.5)

## 2015-01-16 LAB — BASIC METABOLIC PANEL
ANION GAP: 7 (ref 5–15)
BUN: 17 mg/dL (ref 6–20)
CO2: 24 mmol/L (ref 22–32)
CREATININE: 0.87 mg/dL (ref 0.61–1.24)
Calcium: 8.9 mg/dL (ref 8.9–10.3)
Chloride: 105 mmol/L (ref 101–111)
GFR calc Af Amer: >60 mL/min (ref >60)
GFR calc non Af Amer: >60 mL/min (ref >60)
GLUCOSE: 288 mg/dL — AB (ref 65–99)
Potassium: 4.1 mmol/L (ref 3.5–5.1)
SODIUM: 136 mmol/L (ref 135–145)

## 2015-01-16 LAB — ABO/RH: ABO/RH(D): A POS

## 2015-01-16 NOTE — Progress Notes (Addendum)
10-17-14 - lov - Dr. Verl Blalock (Int. Med.)  EPIC 09-24-14 - Lithotripsy - Dr. Gaynelle Arabian - EP[facility]

## 2015-01-16 NOTE — Progress Notes (Signed)
01-16-15 - Faxed lab results (BMP) from preop appointment to Dr. Gaynelle Arabian  Via EPIC

## 2015-01-20 NOTE — H&P (Signed)
Reason For Visit Pre-operative note   Active Problems Problems  1. Abnormal glucose tolerance test (R73.02) 2. Benign prostatic hyperplasia with urinary obstruction (N40.1,N13.8) 3. Bilateral kidney stones (N20.0) 4. Dysuria (R30.0) 5. Elevated prostate specific antigen (PSA) (R97.2) 6. Erectile dysfunction due to arterial insufficiency (N52.01) 7. Glycosuria (R81) 8. Microhematuria (R31.2) 9. Muscle weakness (M62.81) 10. Nocturia (R35.1) 11. Non-Bacterial Prostatitis (N41.9) 12. Obstruction of left ureteropelvic junction (UPJ) due to stone (N20.1) 13. Testicular pain (N50.8) 14. Urinary retention (R33.9)   Assessed By: Carolan Clines (Urology); Last Assessed: 20 Jan 2015 15. Urinary urgency (R39.15)  History of Present Illness 74 yo married Type II diabetic male, with a hx of BPH, failing medical therap[y, currently on Rapaflo and Propscar. He has a hx of elevated PSA & ED.    He has a long history of bladder outlet obstruction,with urinary frequency, difficulty postponing urination, & nocturia x 3. He denies incontinence, dysuria or hematuria. IPSS currently =19. Wt loss 25 lbs in 1 year.     He does have a long history of PSA elevations and prior negative biopsies (last biopsy 1995). His PSA has been in a 6 to 8 range for several years.    He was counselled with his wife and his son after evaluation of prostatic outlet obstruction, with the finding of a massively enlarged prostate (222 g ). He is a type II diabetic, although he does not require any medications for his diabetes. He has a urinary peak flow rate of 6 cc/s. ( N>25cc/sec). His IPSS equals 19, ( N 7-10), with urgency, frequency, nocturia, decrease in size and force of urinary stream, and sensation of incomplete emptying. I have reviewed his prostate ultrasound with the patient and his family, and we have discussed the fact that the prostate is outside the range of minimally invasive treatments, as well as  classical TURP type treatments, laser treatments, microwave treatments, and medical therapy.    We have considered both open vs Robotic prostatectomy. He will need to be in the hospital for 2-3 days, and we discussed the possible complications of phlebitis, bleeding, infection, cardiac problems, and anesthesia complications. The patient's son works in the [employer], and is well aware of complications as well.     The patient has asked what would happen if he chooses the "no therapy" option. We have discussed the fact that we will keep him on both Rapaflo and finasteride. Finasteride would be expected to shrink the prostate by 20% over 6 month.. Eventually, however, the patient will need to have a catheter to drain his urine, or suprapubic catheter, and we have discussed prostatic growth, and the expectation of eventual ureteral obstruction.     At 74 years old, he is an excellent physical condition, and could recently expect a live greater than 10 more years. Because of that, the "no treatment" option would seem reasonable on a temporary basis, but not on a permanent basis.   He has selected open retropubic prostatectomy for treatment of his BPH.     08/23/14 4K - 2% (1 in 52 men biopsied would have an aggressive prostate cancer), PSA - 6.5/24%  02/12/14 PSA - 7.35/25%  01/09/13 PSA - 6.47/25%  10/20/11 PSA - 8.63/26%  07/03/11 PSA - 8.12/28%   Past Medical History Problems  1. History of Cancer 2. History of diabetes mellitus (Z86.39) 3. History of hypercholesterolemia (Z86.39) 4. History of hypertension (Z86.79)  Surgical History Problems  1. History of Lithotripsy  2. History of Shoulder Surgery 3. History of Tonsillectomy  Current Meds 1. Aspirin 81 MG TABS;  Therapy: (Recorded:18Sep2008) to Recorded 2. Coenzyme Q10 CAPS;  Therapy: (URKYHCWC:[id number]) to Recorded 3. Finasteride 5 MG Oral Tablet; TAKE ONE TABLET BY MOUTH DAILY;  Therapy: [id number] to (Last  Rx:10May2016)  Requested for: [id number]  Ordered 4. Glimepiride TABS;  Therapy: (OEVOJJKK:[id number]) to Recorded 5. Januvia TABS;  Therapy: (Recorded:24Mar2016) to Recorded 6. MetFORMIN HCl TABS;  Therapy: (ZJIRCVEL:[id number]) to Recorded 7. Multi-Vitamin TABS;  Therapy: (Recorded:18Sep2008) to Recorded 8. Rapaflo 8 MG Oral Capsule; TAKE ONE CAPSULE BY MOUTH EVERY  DAY WITH FOOD;  Therapy: [id number] to (Evaluate:28Nov2016)  Requested for: [id number];  Last Rx:03Mar2016 Ordered 9. Vytorin TABS;  Therapy: (RXVQMGQQ:76PPJ0932) to Recorded  Allergies Medication  1. Vicodin TABS  Family History Problems  1. Family history of Family Health Status - Father's Age : Father   Old AgeAge 39 2. Family history of Family Health Status - Mother's Age : Mother 3. Family history of Family Health Status Number Of Children   3 sons  Social History Problems  1. Denied: History of Alcohol Use 2. Daily Coffee Consumption (___ Cups/Day)   1 cup per day 3. Daily Tea Consumption (___ Cups/Day)   1 cup per day 4. Marital History - Currently Married 5. Never A Smoker 6. Occupation:   [employer] 7. Denied: History of Tobacco Use  Review of Systems  Genitourinary: urinary frequency, feelings of urinary urgency and nocturia.  Musculoskeletal: joint pain.    Physical Exam Constitutional: Well nourished and well developed . No acute distress.  ENT:. The ears and nose are normal in appearance.  Neck: The appearance of the neck is normal and no neck mass is present.  Pulmonary: No respiratory distress and normal respiratory rhythm and effort.  Cardiovascular: Heart rate and rhythm are normal . No peripheral edema.  Abdomen: The abdomen is soft and nontender. No masses are palpated. No CVA tenderness. No hernias are palpable. No hepatosplenomegaly noted.  Rectal: Rectal exam demonstrates normal sphincter tone, the anus is normal on inspection., no tenderness, no masses, no residual hemorrhoidal  skin tags seen, no internal hemorrhoids, no external hemorrhoids and no fecal impaction. Exam was negative for occult blood. Estimated prostate size is 4+. Normal rectal tone, no rectal masses, prostate is smooth, symmetric and non-tender. The prostate has no nodularity and is not tender. The left seminal vesicle is nonpalpable. The right seminal vesicle is nonpalpable. The perineum is normal on inspection.  Genitourinary: Examination of the penis demonstrates no discharge, no masses, no lesions and a normal meatus. The scrotum is without lesions. The right epididymis is palpably normal and non-tender. The left epididymis is palpably normal and non-tender. The right testis is non-tender and without masses. The left testis is non-tender and without masses.  Lymphatics: The femoral and inguinal nodes are not enlarged or tender.  Skin: Normal skin turgor, no visible rash and no visible skin lesions.  Neuro/Psych:. Mood and affect are appropriate.    Procedure Prostate u/s today: Length - 7.85cm, Width - 7.64cm and Height - 7.08cm. Total volume - 222.22 grams. Subcentimeter hyperechoic areas and cysts within the prostate.     Flow Rate: Peak : 6cc/sec    PVR= 15.43cc   Procedure: Cystoscopy  Chaperone Present: laura.  Indication: Lower Urinary Tract Symptoms.  Informed Consent: Risks, benefits, and potential adverse events were discussed and informed consent was obtained from the patient.  Prep: The patient was prepped with betadine.  Anesthesia:. Local anesthesia  was administered intraurethrally with 2% lidocaine jelly.  Antibiotic prophylaxis: Ciprofloxacin.  Procedure Note:  Urethral meatus:. No abnormalities.  Anterior urethra: No abnormalities.  Prostatic urethra:. There was visual obstruction of the prostatic urethra. An enlarged intravesical median lobe was visualized.  Bladder: Examination of the bladder demonstrated trabeculation and a diverticulum, but no clot within the bladder  cellules, but no erythematous mucosa, no ulcer and no edema. The patient tolerated the procedure well.  Complications: None.    Assessment Assessed  1. Benign prostatic hyperplasia with urinary obstruction (N40.1,N13.8) 2. Elevated prostate specific antigen (PSA) (R97.2) 3. Glycosuria (R81) 4. Nocturia (R35.1) 5. Obstruction of left ureteropelvic junction (UPJ) due to stone (N20.1) 6. Urinary urgency (R39.15) 7. Urinary retention (R33.9)  We have discussed hospitalization, surgery, and the fact that ha already had physical therapy evaluation prior to surgery, to begin to work on his pelvic muscle control.    Postoperative, he will recover in stages, including in-hospital care, and immediate postop care; and 10 day cystogram prior to removal of his Foley catheter ( in-office). He will not drive for one to 2 weeks. In addition, the patient will take 6 weeks to 12 weeks to completely heal. I do not anticipate that he will need any blood transfusion, but I will order Type and screen as a preparative measure.     He has continued Rapaflo and Proscar, had PT consult, and has seen his Cardiologist,Dr. Gershon Mussel Wall,2-3 months ago with normal cardiac exam.    Plan 1. Prepare for open retropubic prostatectomy. Note cystoscopy showed some intravesical portion of gland, but we will tray to avoid opening the urinary bladder if possible and remove from the retropubic position.   2. Hematuria catheter, Floseal, Resident to help. Local anesthesia, IV Toradol and Tylenol.   Discussion/Summary    cc: Dr. Felipa Eth   cc: Dr. Mar Daring     Signatures Electronically signed by : Carolan Clines, M.D.; Jan 20 2015  4:17PM EST

## 2015-01-21 ENCOUNTER — Encounter (HOSPITAL_COMMUNITY)
Admission: RE | Disposition: A | Discharge status: Home / Self Care | Payer: Self-pay | Source: Ambulatory Visit | Attending: Urology

## 2015-01-21 ENCOUNTER — Encounter (HOSPITAL_COMMUNITY): Payer: Self-pay | Admitting: Certified Registered Nurse Anesthetist

## 2015-01-21 ENCOUNTER — Inpatient Hospital Stay (HOSPITAL_COMMUNITY)
Admission: RE | Admit: 2015-01-21 | Discharge: 2015-01-23 | DRG: 707 | Disposition: A | Discharge status: Home / Self Care | Payer: Medicare Other | Source: Ambulatory Visit | Attending: Urology | Admitting: Urology

## 2015-01-21 ENCOUNTER — Inpatient Hospital Stay (HOSPITAL_COMMUNITY): Payer: Medicare Other | Admitting: Certified Registered Nurse Anesthetist

## 2015-01-21 DIAGNOSIS — D62 Acute posthemorrhagic anemia: Secondary | ICD-10-CM | POA: Diagnosis not present

## 2015-01-21 DIAGNOSIS — Z7982 Long term (current) use of aspirin: Secondary | ICD-10-CM | POA: Diagnosis not present

## 2015-01-21 DIAGNOSIS — M6281 Muscle weakness (generalized): Secondary | ICD-10-CM | POA: Diagnosis present

## 2015-01-21 DIAGNOSIS — N521 Erectile dysfunction due to diseases classified elsewhere: Secondary | ICD-10-CM | POA: Diagnosis present

## 2015-01-21 DIAGNOSIS — N401 Enlarged prostate with lower urinary tract symptoms: Secondary | ICD-10-CM | POA: Diagnosis present

## 2015-01-21 DIAGNOSIS — R81 Glycosuria: Secondary | ICD-10-CM | POA: Diagnosis present

## 2015-01-21 DIAGNOSIS — N410 Acute prostatitis: Secondary | ICD-10-CM | POA: Diagnosis not present

## 2015-01-21 DIAGNOSIS — Z79899 Other long term (current) drug therapy: Secondary | ICD-10-CM | POA: Diagnosis not present

## 2015-01-21 DIAGNOSIS — E119 Type 2 diabetes mellitus without complications: Secondary | ICD-10-CM | POA: Diagnosis present

## 2015-01-21 DIAGNOSIS — E780 Pure hypercholesterolemia: Secondary | ICD-10-CM | POA: Diagnosis present

## 2015-01-21 DIAGNOSIS — R3915 Urgency of urination: Secondary | ICD-10-CM | POA: Diagnosis present

## 2015-01-21 DIAGNOSIS — R350 Frequency of micturition: Secondary | ICD-10-CM | POA: Diagnosis present

## 2015-01-21 DIAGNOSIS — I10 Essential (primary) hypertension: Secondary | ICD-10-CM | POA: Diagnosis present

## 2015-01-21 DIAGNOSIS — N320 Bladder-neck obstruction: Secondary | ICD-10-CM | POA: Diagnosis present

## 2015-01-21 DIAGNOSIS — N200 Calculus of kidney: Secondary | ICD-10-CM | POA: Diagnosis present

## 2015-01-21 DIAGNOSIS — N419 Inflammatory disease of prostate, unspecified: Secondary | ICD-10-CM | POA: Diagnosis present

## 2015-01-21 DIAGNOSIS — N400 Enlarged prostate without lower urinary tract symptoms: Secondary | ICD-10-CM | POA: Diagnosis not present

## 2015-01-21 HISTORY — PX: PROSTATECTOMY: SHX69

## 2015-01-21 LAB — GLUCOSE, CAPILLARY
GLUCOSE-CAPILLARY: 130 mg/dL — AB (ref 65–99)
GLUCOSE-CAPILLARY: 190 mg/dL — AB (ref 65–99)
GLUCOSE-CAPILLARY: 216 mg/dL — AB (ref 65–99)
Glucose-Capillary: 162 mg/dL — ABNORMAL HIGH (ref 65–99)
Glucose-Capillary: 194 mg/dL — ABNORMAL HIGH (ref 65–99)
Glucose-Capillary: 217 mg/dL — ABNORMAL HIGH (ref 65–99)

## 2015-01-21 LAB — HEMOGLOBIN AND HEMATOCRIT, BLOOD
HCT: 30.8 % — ABNORMAL LOW (ref 39.0–52.0)
Hemoglobin: 10.4 g/dL — ABNORMAL LOW (ref 13.0–17.0)

## 2015-01-21 LAB — TYPE AND SCREEN
ABO/RH(D): A POS
Antibody Screen: NEGATIVE

## 2015-01-21 SURGERY — PROSTATECTOMY, RETROPUBIC
Anesthesia: General

## 2015-01-21 MED ORDER — HYDROMORPHONE HCL 1 MG/ML IJ SOLN
INTRAMUSCULAR | Status: AC
  Filled 2015-01-21: qty 1

## 2015-01-21 MED ORDER — DIPHENHYDRAMINE HCL 12.5 MG/5ML PO ELIX
12.5000 mg | ORAL_SOLUTION | Freq: Four times a day (QID) | ORAL | Status: DC | PRN
Start: 2015-01-21 — End: 2015-01-23
  Administered 2015-01-22: 25 mg via ORAL
  Filled 2015-01-21: qty 10

## 2015-01-21 MED ORDER — SODIUM CHLORIDE 0.9 % IV BOLUS (SEPSIS)
1000.0000 mL | Freq: Once | INTRAVENOUS | Status: DC
Start: 2015-01-21 — End: 2015-01-21

## 2015-01-21 MED ORDER — SODIUM CHLORIDE 0.9 % IJ SOLN
INTRAMUSCULAR | Status: AC
  Filled 2015-01-21: qty 50

## 2015-01-21 MED ORDER — PROPOFOL 10 MG/ML IV BOLUS
INTRAVENOUS | Status: AC
  Filled 2015-01-21: qty 20

## 2015-01-21 MED ORDER — ONDANSETRON HCL 4 MG/2ML IJ SOLN: 4.0000 mg | Freq: Four times a day (QID) | INTRAMUSCULAR | Status: DC | PRN

## 2015-01-21 MED ORDER — GLYCOPYRROLATE 0.2 MG/ML IJ SOLN
INTRAMUSCULAR | Status: DC | PRN
  Administered 2015-01-21: 0.6 mg via INTRAVENOUS

## 2015-01-21 MED ORDER — ACETAMINOPHEN 10 MG/ML IV SOLN
INTRAVENOUS | Status: AC
  Filled 2015-01-21: qty 100

## 2015-01-21 MED ORDER — SODIUM CHLORIDE 0.9 % IJ SOLN
INTRAMUSCULAR | Status: AC
  Filled 2015-01-21: qty 10

## 2015-01-21 MED ORDER — KETOROLAC TROMETHAMINE 15 MG/ML IJ SOLN
15.0000 mg | Freq: Four times a day (QID) | INTRAMUSCULAR | Status: DC
  Administered 2015-01-21 – 2015-01-22 (×3): 15 mg via INTRAVENOUS
  Filled 2015-01-21 (×6): qty 1

## 2015-01-21 MED ORDER — EPHEDRINE SULFATE 50 MG/ML IJ SOLN
INTRAMUSCULAR | Status: AC
  Filled 2015-01-21: qty 1

## 2015-01-21 MED ORDER — MORPHINE SULFATE 2 MG/ML IJ SOLN: 2.0000 mg | INTRAMUSCULAR | Status: DC | PRN

## 2015-01-21 MED ORDER — LACTATED RINGERS IV SOLN
INTRAVENOUS | Status: DC | PRN
  Administered 2015-01-21 (×3): via INTRAVENOUS

## 2015-01-21 MED ORDER — PHENYLEPHRINE HCL 10 MG/ML IJ SOLN
INTRAMUSCULAR | Status: DC | PRN
  Administered 2015-01-21: 100 ug via INTRAVENOUS

## 2015-01-21 MED ORDER — ACETAMINOPHEN 10 MG/ML IV SOLN
INTRAVENOUS | Status: DC | PRN
  Administered 2015-01-21: 1000 mg via INTRAVENOUS

## 2015-01-21 MED ORDER — INSULIN ASPART 100 UNIT/ML ~~LOC~~ SOLN
0.0000 [IU] | SUBCUTANEOUS | Status: DC
  Administered 2015-01-21: 5 [IU] via SUBCUTANEOUS
  Administered 2015-01-21 (×2): 3 [IU] via SUBCUTANEOUS
  Administered 2015-01-22: 2 [IU] via SUBCUTANEOUS
  Administered 2015-01-22: 3 [IU] via SUBCUTANEOUS
  Administered 2015-01-22: 5 [IU] via SUBCUTANEOUS
  Administered 2015-01-22 (×2): 3 [IU] via SUBCUTANEOUS
  Administered 2015-01-22: 2 [IU] via SUBCUTANEOUS
  Administered 2015-01-22: 3 [IU] via SUBCUTANEOUS
  Administered 2015-01-23: 5 [IU] via SUBCUTANEOUS

## 2015-01-21 MED ORDER — BACITRACIN-NEOMYCIN-POLYMYXIN 400-5-5000 EX OINT: 1.0000 "application " | TOPICAL_OINTMENT | Freq: Three times a day (TID) | CUTANEOUS | Status: DC | PRN

## 2015-01-21 MED ORDER — BUPIVACAINE LIPOSOME 1.3 % IJ SUSP
20.0000 mL | Freq: Once | INTRAMUSCULAR | Status: AC
  Administered 2015-01-21: 20 mL
  Filled 2015-01-21: qty 20

## 2015-01-21 MED ORDER — DIPHENHYDRAMINE HCL 50 MG/ML IJ SOLN: 12.5000 mg | Freq: Four times a day (QID) | INTRAMUSCULAR | Status: DC | PRN

## 2015-01-21 MED ORDER — NEOSTIGMINE METHYLSULFATE 10 MG/10ML IV SOLN
INTRAVENOUS | Status: DC | PRN
  Administered 2015-01-21: 4 mg via INTRAVENOUS

## 2015-01-21 MED ORDER — ROCURONIUM BROMIDE 100 MG/10ML IV SOLN
INTRAVENOUS | Status: DC | PRN
  Administered 2015-01-21: 20 mg via INTRAVENOUS
  Administered 2015-01-21: 50 mg via INTRAVENOUS
  Administered 2015-01-21: 10 mg via INTRAVENOUS

## 2015-01-21 MED ORDER — MELATONIN 5 MG PO TABS: 5.0000 mg | ORAL_TABLET | Freq: Every evening | ORAL | Status: DC | PRN

## 2015-01-21 MED ORDER — SODIUM CHLORIDE 0.9 % IV SOLN
INTRAVENOUS | Status: DC | PRN
  Administered 2015-01-21: 09:00:00 via INTRAVENOUS

## 2015-01-21 MED ORDER — EPHEDRINE SULFATE 50 MG/ML IJ SOLN
INTRAMUSCULAR | Status: DC | PRN
  Administered 2015-01-21: 10 mg via INTRAVENOUS
  Administered 2015-01-21: 5 mg via INTRAVENOUS

## 2015-01-21 MED ORDER — BACITRACIN ZINC 500 UNIT/GM EX OINT
TOPICAL_OINTMENT | CUTANEOUS | Status: AC
  Filled 2015-01-21: qty 28.35

## 2015-01-21 MED ORDER — KCL IN DEXTROSE-NACL 20-5-0.45 MEQ/L-%-% IV SOLN
INTRAVENOUS | Status: DC
  Administered 2015-01-21 (×2): via INTRAVENOUS
  Filled 2015-01-21 (×3): qty 1000

## 2015-01-21 MED ORDER — ROCURONIUM BROMIDE 100 MG/10ML IV SOLN
INTRAVENOUS | Status: AC
  Filled 2015-01-21: qty 1

## 2015-01-21 MED ORDER — OXYCODONE HCL 5 MG PO TABS: 5.0000 mg | ORAL_TABLET | Freq: Once | ORAL | Status: DC | PRN

## 2015-01-21 MED ORDER — EZETIMIBE-SIMVASTATIN 10-80 MG PO TABS
0.5000 | ORAL_TABLET | Freq: Every day | ORAL | Status: DC
Start: 2015-01-21 — End: 2015-01-23
  Administered 2015-01-21 – 2015-01-22 (×2): 0.5 via ORAL
  Filled 2015-01-21 (×4): qty 0.5

## 2015-01-21 MED ORDER — CEFAZOLIN SODIUM-DEXTROSE 2-3 GM-% IV SOLR
2.0000 g | INTRAVENOUS | Status: AC
  Administered 2015-01-21: 2 g via INTRAVENOUS
  Filled 2015-01-21: qty 50

## 2015-01-21 MED ORDER — KETOROLAC TROMETHAMINE 15 MG/ML IJ SOLN
INTRAMUSCULAR | Status: DC | PRN
  Administered 2015-01-21: 15 mg via INTRAVENOUS

## 2015-01-21 MED ORDER — FENTANYL CITRATE (PF) 100 MCG/2ML IJ SOLN
INTRAMUSCULAR | Status: DC | PRN
  Administered 2015-01-21 (×2): 50 ug via INTRAVENOUS
  Administered 2015-01-21: 100 ug via INTRAVENOUS
  Administered 2015-01-21: 50 ug via INTRAVENOUS

## 2015-01-21 MED ORDER — ACETAMINOPHEN 325 MG PO TABS: 650.0000 mg | ORAL_TABLET | ORAL | Status: DC | PRN

## 2015-01-21 MED ORDER — HYDROMORPHONE HCL 1 MG/ML IJ SOLN
0.2500 mg | INTRAMUSCULAR | Status: DC | PRN
  Administered 2015-01-21: 0.25 mg via INTRAVENOUS
  Administered 2015-01-21 (×2): 0.5 mg via INTRAVENOUS
  Administered 2015-01-21: 0.25 mg via INTRAVENOUS
  Administered 2015-01-21: 0.5 mg via INTRAVENOUS

## 2015-01-21 MED ORDER — CEFAZOLIN SODIUM-DEXTROSE 2-3 GM-% IV SOLR
INTRAVENOUS | Status: AC
Start: 2015-01-21 — End: 2015-01-21
  Filled 2015-01-21: qty 50

## 2015-01-21 MED ORDER — FENTANYL CITRATE (PF) 250 MCG/5ML IJ SOLN
INTRAMUSCULAR | Status: AC
  Filled 2015-01-21: qty 5

## 2015-01-21 MED ORDER — LIDOCAINE HCL (CARDIAC) 20 MG/ML IV SOLN
INTRAVENOUS | Status: DC | PRN
Start: 2015-01-21 — End: 2015-01-21
  Administered 2015-01-21: 100 mg via INTRAVENOUS

## 2015-01-21 MED ORDER — BACITRACIN ZINC 500 UNIT/GM EX OINT
TOPICAL_OINTMENT | CUTANEOUS | Status: DC | PRN
Start: 2015-01-21 — End: 2015-01-21
  Administered 2015-01-21: 1 via TOPICAL

## 2015-01-21 MED ORDER — FINASTERIDE 5 MG PO TABS
5.0000 mg | ORAL_TABLET | Freq: Every day | ORAL | Status: DC
  Administered 2015-01-22 – 2015-01-23 (×2): 5 mg via ORAL
  Filled 2015-01-21 (×2): qty 1

## 2015-01-21 MED ORDER — LIDOCAINE HCL (CARDIAC) 20 MG/ML IV SOLN
INTRAVENOUS | Status: AC
  Filled 2015-01-21: qty 5

## 2015-01-21 MED ORDER — OXYCODONE HCL 5 MG/5ML PO SOLN
5.0000 mg | Freq: Once | ORAL | Status: DC | PRN
  Filled 2015-01-21: qty 5

## 2015-01-21 MED ORDER — ONDANSETRON HCL 4 MG/2ML IJ SOLN: 4.0000 mg | INTRAMUSCULAR | Status: DC | PRN

## 2015-01-21 MED ORDER — HYDROMORPHONE HCL 1 MG/ML IJ SOLN
1.0000 mg | INTRAMUSCULAR | Status: DC | PRN
  Administered 2015-01-21: 1 mg via INTRAVENOUS
  Filled 2015-01-21: qty 1

## 2015-01-21 MED ORDER — DOCUSATE SODIUM 100 MG PO CAPS
100.0000 mg | ORAL_CAPSULE | Freq: Two times a day (BID) | ORAL | Status: DC
  Administered 2015-01-21 – 2015-01-23 (×4): 100 mg via ORAL
  Filled 2015-01-21 (×6): qty 1

## 2015-01-21 MED ORDER — ONDANSETRON HCL 4 MG/2ML IJ SOLN
INTRAMUSCULAR | Status: DC | PRN
  Administered 2015-01-21: 4 mg via INTRAVENOUS

## 2015-01-21 MED ORDER — OXYBUTYNIN CHLORIDE 5 MG PO TABS
5.0000 mg | ORAL_TABLET | Freq: Three times a day (TID) | ORAL | Status: DC | PRN
  Administered 2015-01-23: 5 mg via ORAL
  Filled 2015-01-21: qty 1

## 2015-01-21 MED ORDER — PROPOFOL 10 MG/ML IV BOLUS
INTRAVENOUS | Status: DC | PRN
  Administered 2015-01-21: 180 mg via INTRAVENOUS

## 2015-01-21 MED ORDER — CEFAZOLIN SODIUM 1-5 GM-% IV SOLN
1.0000 g | Freq: Three times a day (TID) | INTRAVENOUS | Status: AC
  Administered 2015-01-21 – 2015-01-22 (×2): 1 g via INTRAVENOUS
  Filled 2015-01-21 (×2): qty 50

## 2015-01-21 SURGICAL SUPPLY — 58 items
BAG URINE DRAINAGE (UROLOGICAL SUPPLIES) ×2 IMPLANT
BLADE EXTENDED COATED 6.5IN (ELECTRODE) ×2 IMPLANT
BLADE HEX COATED 2.75 (ELECTRODE) ×2 IMPLANT
BLADE SURG 15 STRL LF DISP TIS (BLADE) ×1 IMPLANT
BLADE SURG 15 STRL SS (BLADE) ×1
CATH FOLEY 2WAY SLVR 30CC 22FR (CATHETERS) IMPLANT
CATH HEMA 3WAY 30CC 22FR COUDE (CATHETERS) ×2 IMPLANT
CATH SILASTIC FOLEY 20FRX30CC (CATHETERS) IMPLANT
CLIP LIGATING HEM O LOK PURPLE (MISCELLANEOUS) ×2 IMPLANT
CLIP LIGATING HEMO O LOK GREEN (MISCELLANEOUS) ×2 IMPLANT
CLIP TI MEDIUM 6 (CLIP) ×2 IMPLANT
COVER SURGICAL LIGHT HANDLE (MISCELLANEOUS) IMPLANT
DISSECTOR ROUND CHERRY 3/8 STR (MISCELLANEOUS) ×2 IMPLANT
DRAIN CHANNEL 10F 3/8 F FF (DRAIN) IMPLANT
DRAIN CHANNEL 19F RND (DRAIN) ×2 IMPLANT
DRAPE LAPAROTOMY T 102X78X121 (DRAPES) ×2 IMPLANT
DRAPE TABLE BACK 44X90 PK DISP (DRAPES) IMPLANT
DRAPE UTILITY 15X26 (DRAPE) ×2 IMPLANT
DRAPE WARM FLUID 44X44 (DRAPE) ×2 IMPLANT
DRSG OPSITE POSTOP 4X8 (GAUZE/BANDAGES/DRESSINGS) ×2 IMPLANT
ELECT REM PT RETURN 9FT ADLT (ELECTROSURGICAL) ×2
ELECTRODE REM PT RTRN 9FT ADLT (ELECTROSURGICAL) ×1 IMPLANT
EVACUATOR SILICONE 100CC (DRAIN) ×2 IMPLANT
FLOSEAL 10ML (HEMOSTASIS) ×2 IMPLANT
GAUZE SPONGE 4X4 12PLY STRL (GAUZE/BANDAGES/DRESSINGS) IMPLANT
GAUZE SPONGE 4X4 16PLY XRAY LF (GAUZE/BANDAGES/DRESSINGS) ×2 IMPLANT
GLOVE BIOGEL M STRL SZ7.5 (GLOVE) ×12 IMPLANT
GOWN STRL REUS W/TWL XL LVL3 (GOWN DISPOSABLE) ×6 IMPLANT
HOLDER FOLEY CATH W/STRAP (MISCELLANEOUS) ×2 IMPLANT
KIT BASIN OR (CUSTOM PROCEDURE TRAY) ×2 IMPLANT
LIQUID BAND (GAUZE/BANDAGES/DRESSINGS) ×2 IMPLANT
NS IRRIG 1000ML POUR BTL (IV SOLUTION) ×4 IMPLANT
PACK GENERAL/GYN (CUSTOM PROCEDURE TRAY) ×2 IMPLANT
PLUG CATH AND CAP STER (CATHETERS) ×2 IMPLANT
SCRUB PCMX 4 OZ (MISCELLANEOUS) IMPLANT
SPONGE LAP 18X18 X RAY DECT (DISPOSABLE) IMPLANT
SPONGE LAP 4X18 X RAY DECT (DISPOSABLE) ×2 IMPLANT
STAPLER VISISTAT 35W (STAPLE) ×2 IMPLANT
SURGILUBE 3G PEEL PACK STRL (MISCELLANEOUS) ×2 IMPLANT
SUT ETHILON 3 0 PS 1 (SUTURE) ×2 IMPLANT
SUT MNCRL AB 4-0 PS2 18 (SUTURE) ×2 IMPLANT
SUT PDS AB 1 CTX 36 (SUTURE) ×4 IMPLANT
SUT SILK 0 (SUTURE) ×1
SUT SILK 0 30XBRD TIE 6 (SUTURE) ×1 IMPLANT
SUT VIC AB 0 CT1 27 (SUTURE)
SUT VIC AB 0 CT1 27XBRD ANTBC (SUTURE) IMPLANT
SUT VIC AB 0 UR5 27 (SUTURE) IMPLANT
SUT VIC AB 2-0 UR5 27 (SUTURE) ×24 IMPLANT
SUT VIC AB 4-0 PS2 18 (SUTURE) ×4 IMPLANT
SUT VIC AB 4-0 RB1 27 (SUTURE)
SUT VIC AB 4-0 RB1 27XBRD (SUTURE) IMPLANT
SYR 30ML LL (SYRINGE) ×2 IMPLANT
TAPE CLOTH SURG 4X10 WHT LF (GAUZE/BANDAGES/DRESSINGS) IMPLANT
TAPE UMBILICAL COTTON 1/8X30 (MISCELLANEOUS) IMPLANT
TOWEL OR 17X26 10 PK STRL BLUE (TOWEL DISPOSABLE) ×2 IMPLANT
TOWEL OR NON WOVEN STRL DISP B (DISPOSABLE) ×2 IMPLANT
WATER STERILE IRR 1500ML POUR (IV SOLUTION) IMPLANT
YANKAUER SUCT BULB TIP 10FT TU (MISCELLANEOUS) ×2 IMPLANT

## 2015-01-21 NOTE — Anesthesia Procedure Notes (Signed)
Procedure Name: Intubation Performed by: Gean Maidens Pre-anesthesia Checklist: Patient identified, Emergency Drugs available, Suction available, Patient being monitored and Timeout performed Patient Re-evaluated:Patient Re-evaluated prior to inductionOxygen Delivery Method: Circle system utilized Preoxygenation: Pre-oxygenation with 100% oxygen Intubation Type: IV induction Ventilation: Mask ventilation without difficulty Laryngoscope Size: Mac and 4 Grade View: Grade II Tube type: Oral Tube size: 7.5 mm Number of attempts: 1 Airway Equipment and Method: Stylet Secured at: 23 cm Tube secured with: Tape Dental Injury: Teeth and Oropharynx as per pre-operative assessment

## 2015-01-21 NOTE — Progress Notes (Signed)
UROLOGY PROGRESS NOTES  Assessment/Plan: Samuel Ramos is a 74 y.o. male with history of BPH, DM status post open retropubic simple prostatectomy 01/21/15.   Neuro:  - Pain controlled on PO narcotics, IV toradol, breakthrough dilaudid  CV:  - HDS  Pulmonary:  - stable on 2L [place]  FEN/GI:  - Diet: clears - Replace electrolytes as needed  GU: - Monitor urine output - 22 Fr hematuria catheter in place, irrigate as needed if catheter stops draining (60 cc in balloon port) will remain in place for 2 weeks -JP drain in LEFT pelvis, monitor outputs  Heme/ID - Starting Hb 13.6, post-op 10.4 - Periop abx  Prophylaxis: - IS, OOB,  Disposition: - Floor status  Subjective: Pain controlled. Post-op Hb 10 from 13.7 will continue to monitor vital signs closely. 130 mL from the JP serosang, 125 from the foley. Denies N/V.  Objective:  Vital signs in last 24 hours: BP 109/56 mmHg  Pulse 80  Temp(Src) 97.6 F (36.4 C) (Oral)  Resp 16  Ht 6\' 4"  (1.93 m)  Wt 88.451 kg (195 lb)  BMI 23.75 kg/m2  SpO2 100%   Intake/Output last 24 hours:   Total I/O In: 3300 [I.V.:3000; IV Piggyback:300] Out: 629 [Urine:125; Drains:130; Blood:700]    Physical Exam: General: No acute distress Pulmonary: Normal work of breathing Cardiovascular: Pulse regular rate and rhythm Abdomen: Soft, nontender, nondistended GU: foley in place draining bright red, thin urine without clot Extremities: Warm and well perfused Neuro: Intact, no obvious deficits  Data Review: WBC      Lab Results  Component Value Date   WBC 6.8 01/16/2015   HGB 10.4* 01/21/2015   HCT 30.8* 01/21/2015   PLT 184 01/16/2015     Lab Results  Component Value Date   NA 136 01/16/2015   K 4.1 01/16/2015   CL 105 01/16/2015   CO2 24 01/16/2015   BUN 17 01/16/2015   CREATININE 0.87 01/16/2015   CALCIUM 8.9 01/16/2015

## 2015-01-21 NOTE — Transfer of Care (Signed)
Immediate Anesthesia Transfer of Care Note  Patient: Samuel Ramos  Procedure(s) Performed: Procedure(s): PROSTATECTOMY RETROPUBIC (N/A)  Patient Location: PACU  Anesthesia Type:General  Level of Consciousness: awake, alert  and oriented  Airway & Oxygen Therapy: Patient Spontanous Breathing and Patient connected to face mask oxygen  Post-op Assessment: Report given to RN and Post -op Vital signs reviewed and stable  Post vital signs: Reviewed and stable  Last Vitals:  Filed Vitals:   01/21/15 0706  BP: 131/67  Pulse: 79  Temp: 36.4 C  Resp: 18    Complications: No apparent anesthesia complications

## 2015-01-21 NOTE — Progress Notes (Signed)
PHARMACIST - PHYSICIAN ORDER COMMUNICATION  CONCERNING: P&T Medication Policy on Herbal Medications  DESCRIPTION:  This patient's order for:  melatonin  has been noted.  This product(s) is classified as an "herbal" or natural product. Due to a lack of definitive safety studies or FDA approval, nonstandard manufacturing practices, plus the potential risk of unknown drug-drug interactions while on inpatient medications, the Pharmacy and Therapeutics Committee does not permit the use of "herbal" or natural products of this type within [facility].   ACTION TAKEN: The pharmacy department is unable to verify this order at this time and your patient has been informed of this safety policy. Please reevaluate patient's clinical condition at discharge and address if the herbal or natural product(s) should be resumed at that time.   Royetta Asal, PharmD, BCPS 01/21/15 13:54

## 2015-01-21 NOTE — Anesthesia Postprocedure Evaluation (Signed)
Anesthesia Post Note  Patient: Samuel Ramos  Procedure(s) Performed: Procedure(s) (LRB): PROSTATECTOMY RETROPUBIC (N/A)  Anesthesia type: General  Patient location: PACU  Post pain: Pain level controlled and Adequate analgesia  Post assessment: Post-op Vital signs reviewed, Patient's Cardiovascular Status Stable, Respiratory Function Stable, Patent Airway and Pain level controlled  Last Vitals:  Filed Vitals:   01/21/15 1200  BP: 124/58  Pulse: 76  Temp:   Resp: 12    Post vital signs: Reviewed and stable  Level of consciousness: awake, alert  and oriented  Complications: No apparent anesthesia complications

## 2015-01-21 NOTE — Interval H&P Note (Signed)
History and Physical Interval Note:  01/21/2015 8:47 AM  Samuel Ramos  has presented today for surgery, with the diagnosis of BPH  The various methods of treatment have been discussed with the patient and family. After consideration of risks, benefits and other options for treatment, the patient has consented to  Procedure(s): PROSTATECTOMY RETROPUBIC (N/A) as a surgical intervention .  The patient's history has been reviewed, patient examined, no change in status, stable for surgery.  I have reviewed the patient's chart and labs.  Questions were answered to the patient's satisfaction.     Samuel Ramos

## 2015-01-21 NOTE — Anesthesia Preprocedure Evaluation (Signed)
Anesthesia Evaluation  Patient identified by MRN, date of birth, ID band Patient awake    Reviewed: Allergy & Precautions, NPO status , Patient's Chart, lab work & pertinent test results  Airway Mallampati: II   Neck ROM: full    Dental   Pulmonary neg pulmonary ROS,  breath sounds clear to auscultation        Cardiovascular negative cardio ROS  Rhythm:regular Rate:Normal     Neuro/Psych    GI/Hepatic   Endo/Other  diabetes, Type 2  Renal/GU      Musculoskeletal   Abdominal   Peds  Hematology   Anesthesia Other Findings   Reproductive/Obstetrics                             Anesthesia Physical Anesthesia Plan  ASA: II  Anesthesia Plan: General   Post-op Pain Management:    Induction: Intravenous  Airway Management Planned: Oral ETT  Additional Equipment:   Intra-op Plan:   Post-operative Plan: Extubation in OR  Informed Consent: I have reviewed the patients History and Physical, chart, labs and discussed the procedure including the risks, benefits and alternatives for the proposed anesthesia with the patient or authorized representative who has indicated his/her understanding and acceptance.     Plan Discussed with: CRNA, Anesthesiologist and Surgeon  Anesthesia Plan Comments:         Anesthesia Quick Evaluation

## 2015-01-21 NOTE — Op Note (Signed)
Post-operative Diagnosis: Benign Prostatic Hyperplasia   Procedure and Anesthesia:  Procedure(s) and Anesthesia Type: 1. Retropubic Simple Prostatectomy (transcapsular) 2. Simple Foley catheter placement  Surgeon: Surgeon(s) and Role:    * Carolan Clines, MD - Primary   Resident:  Star Age, MD  EBL: 700 mL  IVF: See anesthesia record  UOP: See anesthesia record  Drains: 22 Fr 3-way silicon hematuria catheter with 60 cc of sterile water in the balloon, 17 Fr round Terrial Rhodes drain  Implants: * No implants in log *  Specimens: Prostate for permanent pathology. Stitch placed at LEFT intravesical adenomatous component  Complications: * No complications entered in OR log *  Indications for Surgery: 74 y.o. male with refractory BPH with significant bladder outlet obstruction and a very large gland. See H&P for full details. Risks, benefits, and alternatives of the above procedure were discussed previously in detail and informed consents was signed and verified.  Findings: -massive prostate gland (200 plus grams) -transcapsular removal of significant adenoma including substantial intravesical component extending from the LEFT base -Capsule re-approximated -Catheter draining dark, thin fluid and irrigated well post-procedurally   Procedure Details: The patient and consent was verified in the pre-op holding area and brought to the operating room where they were placed on the operating table. Pre-induction time out was called and general anesthesia was induced. SCDs were placed and IV antibiotics were started. The patient was prepped and draped in the usual sterile fashion. A second timeout was performed.  We began by placing a 22 Fr 3-way silicon hematuria catheter with 30 cc of sterile water in the balloon port. We then marked a 5 cm transverse incision from the symphisis pubis to the semi-lunar line. Skin incision was carried down to the subcutaneous fat with a #10 blade  scalpel. The subcutaneous tissues were dissected using electrocautery. We continued our dissection down through the belly of the rectus through the rectus fascia until we entered the space of Retzius.  The lateral attachments to the bladder were taken down both bluntly and sharply with the assistance of electrocautery. We then identified the foley catheter balloon, bladder neck and prostate. We then inserted the Omni retractor to provide adequate exposure laterally and to retract the bladder cephalad. We ligated the superficial venous complex using a combination of suture ligation and Hem-o-Lok clips.   We then cleaned off the tissues surrounding the prostate both bluntly using a sponge stick and sharply using Metzenbaum scissors until we had clearly identified the shiny white capsule of the prostate. Hemostasis was obtained using electrocautery and figure of eight Vicryl sutures. We then placed 3-0 Silk stay sutures laterally along the prostatic capsule. We then incised the capsule between the stay sutures and developed a plane between adenoma and capsule sharply using Metzenbaum scissors. The adenoma was then cored our bluntly using finger fracture which proceeded easily along the RIGHT side of the gland, however, the LEFT base was challenging as there was a significant intravesical component that needed to be dissected off sharply. The adenoma was then pinched off from the apex and was removed. A single stitch was placed in the LEFT base intravesical component and the specimen was sent for pathology.  We then turned our attention to maintaining hemostasis which we did via a combination of bovie electrocautery and suture ligation as well as utilization of metallic and Hem-o-Lok clips. We then turned our attention to re-approximating the capsular defect. Several 3-0 Vicryl sutures were placed in interrupted fashion to re-approximate the prostatic  capsule around the catheter anteriorly and laterally. Once we  felt we had achieved a good closure an additional 30 cc of sterile water were placed in the balloon port and the catheter was irrigated which demonstrated no sign of leak. The pelvis was again irrigated and once hemostasis was felt to be satisfactory we administered Floseal around the prostate and packed the pelvis with 4x18s and held pressure for 3 minutes. Inspection following removal demonstrated excellent hemostasis. We then placed a 17 Fr round blake drain in the LEFT abdomen and positioned it into the left hemi-pelvis. The drain was secured with 3-0 nylon suture. Exparel was injected into the wound and into the subcutaneous tissues.  We then turned our attention to closing. We re-approximated the rectus muscles with loose 4-0 Vicryl sutures. We then closed the rectus fascia using a single running 0-Vicryl suture. The subcutaneous tissues were re-approximated using 4-0 Vicryl suture. Skin was closed using a 4-0 Monocryl subcuticular stitch and Liquaband was applied. The incision and    Teaching Physician Attestation: Dr. Carolan Clines was present and scrubbed for the entirety of the procedure  Pietro Cassis. Ottis Stain, MD Resident [facility] Department of Urologic Surgery/[facility]

## 2015-01-22 ENCOUNTER — Encounter (HOSPITAL_COMMUNITY): Payer: Self-pay | Admitting: Urology

## 2015-01-22 LAB — BASIC METABOLIC PANEL
Anion gap: 4 — ABNORMAL LOW (ref 5–15)
BUN: 13 mg/dL (ref 6–20)
CO2: 28 mmol/L (ref 22–32)
Calcium: 7.8 mg/dL — ABNORMAL LOW (ref 8.9–10.3)
Chloride: 102 mmol/L (ref 101–111)
Creatinine, Ser: 0.85 mg/dL (ref 0.61–1.24)
GFR calc Af Amer: >60 mL/min (ref >60)
Glucose, Bld: 174 mg/dL — ABNORMAL HIGH (ref 65–99)
POTASSIUM: 3.7 mmol/L (ref 3.5–5.1)
SODIUM: 134 mmol/L — AB (ref 135–145)

## 2015-01-22 LAB — CBC
HCT: 26.9 % — ABNORMAL LOW (ref 39.0–52.0)
Hemoglobin: 9 g/dL — ABNORMAL LOW (ref 13.0–17.0)
MCH: 28.7 pg (ref 26.0–34.0)
MCHC: 33.5 g/dL (ref 30.0–36.0)
MCV: 85.7 fL (ref 78.0–100.0)
Platelets: 128 10*3/uL — ABNORMAL LOW (ref 150–400)
RBC: 3.14 MIL/uL — ABNORMAL LOW (ref 4.22–5.81)
RDW: 13.4 % (ref 11.5–15.5)
WBC: 6.9 10*3/uL (ref 4.0–10.5)

## 2015-01-22 LAB — GLUCOSE, CAPILLARY
GLUCOSE-CAPILLARY: 140 mg/dL — AB (ref 65–99)
GLUCOSE-CAPILLARY: 196 mg/dL — AB (ref 65–99)
GLUCOSE-CAPILLARY: 163 mg/dL — AB (ref 65–99)
GLUCOSE-CAPILLARY: 166 mg/dL — AB (ref 65–99)
Glucose-Capillary: 182 mg/dL — ABNORMAL HIGH (ref 65–99)
Glucose-Capillary: 139 mg/dL — ABNORMAL HIGH (ref 65–99)

## 2015-01-22 MED ORDER — DOCUSATE SODIUM 100 MG PO CAPS: 100.0000 mg | ORAL_CAPSULE | Freq: Two times a day (BID) | ORAL | Status: DC

## 2015-01-22 MED ORDER — OXYCODONE HCL 5 MG PO TABS: 5.0000 mg | ORAL_TABLET | ORAL | Status: DC | PRN

## 2015-01-22 MED ORDER — KETOROLAC TROMETHAMINE 15 MG/ML IJ SOLN: 15.0000 mg | Freq: Four times a day (QID) | INTRAMUSCULAR | Status: DC | PRN

## 2015-01-22 MED ORDER — IBUPROFEN 200 MG PO TABS
400.0000 mg | ORAL_TABLET | Freq: Four times a day (QID) | ORAL | Status: DC | PRN
  Administered 2015-01-22 (×2): 400 mg via ORAL
  Filled 2015-01-22 (×2): qty 2

## 2015-01-22 MED ORDER — OXYCODONE-ACETAMINOPHEN 5-325 MG PO TABS: 1.0000 | ORAL_TABLET | ORAL | Status: DC | PRN

## 2015-01-22 MED ORDER — SODIUM CHLORIDE 0.45 % IV SOLN
INTRAVENOUS | Status: DC
  Administered 2015-01-22 – 2015-01-23 (×3): via INTRAVENOUS

## 2015-01-22 MED ORDER — SULFAMETHOXAZOLE-TRIMETHOPRIM 800-160 MG PO TABS: 1.0000 | ORAL_TABLET | Freq: Two times a day (BID) | ORAL | Status: DC

## 2015-01-22 MED ORDER — SULFAMETHOXAZOLE-TRIMETHOPRIM 800-160 MG PO TABS
1.0000 | ORAL_TABLET | Freq: Two times a day (BID) | ORAL | Status: DC
  Administered 2015-01-22 – 2015-01-23 (×3): 1 via ORAL
  Filled 2015-01-22 (×5): qty 1

## 2015-01-22 MED ORDER — OXYBUTYNIN CHLORIDE 5 MG PO TABS: 5.0000 mg | ORAL_TABLET | Freq: Three times a day (TID) | ORAL | Status: DC | PRN

## 2015-01-22 NOTE — Progress Notes (Signed)
UROLOGY PROGRESS NOTES  Assessment/Plan: Samuel Ramos is a 74 y.o. male with history of BPH, DM status post open retropubic simple prostatectomy 01/21/15.   Neuro:  - Pain controlled on PO narcotics, IV toradol, breakthrough dilaudid -Make toradol PRN and encourage PO narcotics  CV:  - HDS  Pulmonary:  - stable on RA  FEN/GI:  - Diet: diabetic regular - Replace electrolytes as needed  GU: - Monitor urine output - 22 Fr hematuria catheter in place, irrigate as needed if catheter stops draining (60 cc in balloon port) will remain in place for 2 weeks -JP drain in LEFT pelvis, 240 total out, decreasing to 10cc over last 7 hours  Heme/ID - Starting Hb 13.6, post-op 10.4 now 9.0, asymptomatic - Periop abx  Prophylaxis: - IS, OOB,  Disposition: - Floor status  Subjective: Pain controlled. Hb continuing to drift, some soft BPs this morning but otherwise asymptomatic. Catheter draining thin kool-aid colored urine.  Objective:  Vital signs in last 24 hours: BP 95/51 mmHg  Pulse 78  Temp(Src) 98.9 F (37.2 C) (Oral)  Resp 16  Ht 6\' 4"  (1.93 m)  Wt 88.451 kg (195 lb)  BMI 23.75 kg/m2  SpO2 98%   Intake/Output last 24 hours: I/O last 3 completed shifts: In: 5570.4 [P.O.:360; I.V.:4810.4; IV Piggyback:400] Out: 1615 [Urine:675; Drains:240; Blood:700]     Physical Exam: General: No acute distress Pulmonary: Normal work of breathing Cardiovascular: Pulse regular rate and rhythm Abdomen: Soft, nontender, nondistended GU: foley in place draining bright red, thin urine without clot Extremities: Warm and well perfused Neuro: Intact, no obvious deficits  Data Review: WBC      Lab Results  Component Value Date   WBC 6.9 01/22/2015   HGB 9.0* 01/22/2015   HCT 26.9* 01/22/2015   PLT 128* 01/22/2015     Lab Results  Component Value Date   NA 134* 01/22/2015   K 3.7 01/22/2015   CL 102 01/22/2015   CO2 28 01/22/2015   BUN 13 01/22/2015   CREATININE 0.85  01/22/2015   CALCIUM 7.8* 01/22/2015

## 2015-01-22 NOTE — Care Management Note (Signed)
Case Management Note  Patient Details  Name: Samuel Ramos MRN: [id number] Date of Birth: 04-07-1941  Subjective/Objective:    74 y/o m admitted w/BPH. From home.s/p prostatectomy.                Action/Plan:No anticipated d/c needs.   Expected Discharge Date:                 Expected Discharge Plan:  Home/Self Care  In-House Referral:     Discharge planning Services  CM Consult  Post Acute Care Choice:    Choice offered to:     DME Arranged:    DME Agency:     HH Arranged:    HH Agency:     Status of Service:  In process, will continue to follow  Medicare Important Message Given:    Date Medicare IM Given:    Medicare IM give by:    Date Additional Medicare IM Given:    Additional Medicare Important Message give by:     If discussed at [place] of Stay Meetings, dates discussed:    Additional Comments:  Dessa Phi, RN 01/22/2015, 1:20 PM

## 2015-01-22 NOTE — Progress Notes (Signed)
Urology Progress Note  1 Day Post-Op   Subjective: Excellent night: foley: old blood draining.  Ambulating  Incentive spirometer     No acute urologic events overnight. Ambulation:   positive Flatus:    positive Bowel movement  negative  Pain: some relief  Objective:  Blood pressure 95/51, pulse 78, temperature 98.9 F (37.2 C), temperature source Oral, resp. rate 16, height 6\' 4"  (1.93 m), weight 88.451 kg (195 lb), SpO2 98 %.  Physical Exam:  General:  No acute distress, awake Resp: clear to auscultation bilaterally Genitourinary:  Normal GU Foley: to straight drain    I/O last 3 completed shifts: In: 5570.4 [P.O.:360; I.V.:4810.4; IV Piggyback:400] Out: 1615 [Urine:675; Drains:240; Blood:700]  Recent Labs     01/21/15  1215  01/22/15  0410  HGB  10.4*  9.0*  WBC   --   6.9  PLT   --   128*    Recent Labs     01/22/15  0410  NA  134*  K  3.7  CL  102  CO2  28  BUN  13  CREATININE  0.85  CALCIUM  7.8*  GFRNONAA  >60  GFRAA  >60     No results for input(s): INR, APTT in the last 72 hours.  Invalid input(s): PT   Invalid input(s): ABG  Assessment/Plan:  Catheter not removed. Wound care discussed. Wash foley to get blood off/Neosporin to junction of glans/foley.

## 2015-01-22 NOTE — Discharge Instructions (Signed)
You have a Foley catheter in place which drains urine out of your bladder. There are two parts: one part has a number and likely a colored band around it - this port should NOT be manipulated; the other piece connects to the drainage tubing and drainage bag. Before discharge from the hospital, your nurse will instruct you how to care for your foley catheter.   A foley catheter drains your bladder by gravity. The drainage bag should always be below the level of your bladder. If you are wearing a leg bag, it should be below your waist, and at night, the bag should lay on the floor next to you in bed.   Sometimes, a piece of tissue or a blood clot can get caught in the foley catheter and cause it not to drain properly. In this case, you may leak urine around the catheter and you may feel like your bladder is full. In this case, you should disconnect the catheter from the drainage bag and flush the catheter with ~30cc of saline (available at [facility]). If this doesnt help, you should come in to be evaluated.  Other times, even though your foley catheter is draining well, you have the feeling that you have a full bladder, and you may leak around your catheter during painful bladder spasms. If this is the case, a medication called oxybutynin (or Ditropan) may help. It is normal to see some blood in your urine from time to time when you have a foley catheter. This can be due to irritation from the foley catheter inside the bladder. However, if your urine is the color of tomato juice with quarter-sized clots, this can clog the catheter, and you should call us for instructions. A physician will likely need to evaluate you.  If you are unable to get in touch with anyone and you feel it truly is an emergency, proceed to the nearest ER or call an ambulance. 1.  Activity:  You are encouraged to ambulate frequently (about every hour during waking hours) to help prevent blood clots from forming in your legs or lungs.   However, you should not engage in any heavy lifting (> 10-15 lbs), strenuous activity, or straining. 2. Diet: You should advance your diet as instructed by your physician.  It will be normal to have some bloating, nausea, and abdominal discomfort intermittently. 3. Prescriptions:  You will be provided a prescription for pain medication to take as needed.  If your pain is not severe enough to require the prescription pain medication, you may take extra strength Tylenol instead which will have less side effects.  You should also take a prescribed stool softener to avoid straining with bowel movements as the prescription pain medication may constipate you. 4. Incisions: You may remove your dressing bandages 48 hours after surgery if not removed in the hospital.  You will either have some small staples or special tissue glue at each of the incision sites. Once the bandages are removed (if present), the incisions may stay open to air.  You may start showering (but not soaking or bathing in water) the 2nd day after surgery and the incisions simply need to be patted dry after the shower.  No additional care is needed. 5. What to call us about: You should call the office [phone]) if you develop fever > 101 or develop persistent vomiting.

## 2015-01-23 LAB — CBC
HEMATOCRIT: 26.2 % — AB (ref 39.0–52.0)
Hemoglobin: 8.8 g/dL — ABNORMAL LOW (ref 13.0–17.0)
MCH: 29.4 pg (ref 26.0–34.0)
MCHC: 33.6 g/dL (ref 30.0–36.0)
MCV: 87.6 fL (ref 78.0–100.0)
Platelets: 125 10*3/uL — ABNORMAL LOW (ref 150–400)
RBC: 2.99 MIL/uL — AB (ref 4.22–5.81)
RDW: 13.6 % (ref 11.5–15.5)
WBC: 6.5 10*3/uL (ref 4.0–10.5)

## 2015-01-23 LAB — BASIC METABOLIC PANEL
Anion gap: 6 (ref 5–15)
BUN: 9 mg/dL (ref 6–20)
CO2: 27 mmol/L (ref 22–32)
Calcium: 7.8 mg/dL — ABNORMAL LOW (ref 8.9–10.3)
Chloride: 106 mmol/L (ref 101–111)
Creatinine, Ser: 0.83 mg/dL (ref 0.61–1.24)
GFR calc Af Amer: >60 mL/min (ref >60)
GFR calc non Af Amer: >60 mL/min (ref >60)
Glucose, Bld: 140 mg/dL — ABNORMAL HIGH (ref 65–99)
Potassium: 3.8 mmol/L (ref 3.5–5.1)
SODIUM: 139 mmol/L (ref 135–145)

## 2015-01-23 LAB — GLUCOSE, CAPILLARY
GLUCOSE-CAPILLARY: 119 mg/dL — AB (ref 65–99)
GLUCOSE-CAPILLARY: 134 mg/dL — AB (ref 65–99)
GLUCOSE-CAPILLARY: 247 mg/dL — AB (ref 65–99)

## 2015-01-23 MED ORDER — POTASSIUM CHLORIDE CRYS ER 20 MEQ PO TBCR
20.0000 meq | EXTENDED_RELEASE_TABLET | Freq: Once | ORAL | Status: AC
  Administered 2015-01-23: 20 meq via ORAL
  Filled 2015-01-23: qty 1

## 2015-01-23 NOTE — Evaluation (Signed)
Physical Therapy Evaluation-1x Patient Details Name: Samuel Ramos MRN: [id number] DOB: [birth year] Today's Date: 01/23/2015   History of Present Illness  74 yo male s/p prostatectomy, catheter placement 7/11. Hx of DM, BPH  Clinical Impression  On eval, pt only required small amount of assist for bed mobility. Otherwise pt was supervision-Mod Ind. Walked a short distance before having to return to room due to leaking from catheter site. Practiced mobility and answered questions from pt/wife. Family will assist as needed at home. Anticipate pt will continue to progress well at home. No further PT needs. Will sign off.     Follow Up Recommendations No PT follow up    Equipment Recommendations  None recommended by PT    Recommendations for Other Services       Precautions / Restrictions Precautions Precautions: None Restrictions Weight Bearing Restrictions: No      Mobility  Bed Mobility Overal bed mobility: Needs Assistance Bed Mobility: Rolling;Sidelying to Sit;Sit to Supine Rolling: Min guard Sidelying to sit: Min assist   Sit to supine: Min guard   General bed mobility comments: Assist for trunk to upright. Increased time and VCs for technique. For back to bed. Pt was in modified long sit position and slowly brought legs back onto bed.   Transfers Overall transfer level: Needs assistance Equipment used: None Transfers: Sit to/from Stand Sit to Stand: Modified independent (Device/Increase time)            Ambulation/Gait Ambulation/Gait assistance: Supervision Ambulation Distance (Feet): 50 Feet Assistive device: None Gait Pattern/deviations: Wide base of support;Step-through pattern     General Gait Details: slow gait speed. Distance shortened due to pt leaking from catheter insertion site  Stairs Stairs: Yes Stairs assistance: Supervision Stair Management: One rail Right;Step to pattern;Forwards Number of Stairs: 4 General stair comments: VCs for step  to technqiue for safety  Wheelchair Mobility    Modified Rankin (Stroke Patients Only)       Balance                                             Pertinent Vitals/Pain Pain Assessment: 0-10 Pain Score: 2  Pain Location: abdomen Pain Intervention(s): Monitored during session    Home Living Family/patient expects to be discharged to:: Private residence Living Arrangements: Spouse/significant other Available Help at Discharge: Family Type of Home: House Home Access: Stairs to enter Entrance Stairs-Rails: Right Entrance Stairs-Number of Steps: 4 Home Layout: Multi-level;Able to live on main level with bedroom/bathroom Home Equipment: None      Prior Function Level of Independence: Independent               Hand Dominance        Extremity/Trunk Assessment   Upper Extremity Assessment: Generalized weakness           Lower Extremity Assessment: Generalized weakness      Cervical / Trunk Assessment: Normal  Communication   Communication: No difficulties  Cognition Arousal/Alertness: Awake/alert Behavior During Therapy: WFL for tasks assessed/performed Overall Cognitive Status: Within Functional Limits for tasks assessed                      General Comments      Exercises        Assessment/Plan    PT Assessment Patent does not need any further PT services  PT Diagnosis  PT Problem List    PT Treatment Interventions     PT Goals (Current goals can be found in the Care Plan section) Acute Rehab PT Goals Patient Stated Goal: regain independence PT Goal Formulation: All assessment and education complete, DC therapy    Frequency     Barriers to discharge        Co-evaluation               End of Session   Activity Tolerance: Patient tolerated treatment well Patient left: in bed;with call bell/phone within reach;with family/visitor present           Time: 1207-1234 PT Time Calculation (min)  (ACUTE ONLY): 27 min   Charges:   PT Evaluation $Initial PT Evaluation Tier I: 1 Procedure     PT G Codes:        Weston Anna, MPT Pager: [phone]

## 2015-01-23 NOTE — Discharge Summary (Signed)
Date of admission: 01/21/2015  Date of discharge: 01/23/2015  Admission diagnosis: BPH with obstruction  Discharge diagnosis: Same as above  Secondary diagnoses: Diabetes mellitus (present on admission)  History and Physical: For full details, please see admission history and physical. Briefly, Samuel Ramos is a 74 y.o. year old patient with history of BPH with obstruction who underwent open retropubic simple prostatectomy 01/21/15 .   Hospital Course:  Samuel Ramos underwent open retropubic simple prostatectomy 01/21/15. He tolerated the procedure well and was transferred to the post-surgical floor POD 0. He was noted to have some acute blood loss anemia with Hb dipping to 10 post-operatively from 13 pre-operatively and ultimately down to 8.8 on POD 2 but he was asymptomatic. Foley catheter never required irrigation and was draining pink, clear urine without clot on day of discharge (NB there are 60 cc of sterile water in the balloon port). JP drain output was minimal and it was removed on POD 2 prior to discharge. His diet was slowly advanced and he was tolerating a regular diet on day of discharge. Physical therapy was consulted to assist with transfer from bed/chair to standing which he did well. He was discharged with foley catheter in place on Bactrim to be continued through catheter removal. Leg bag and foley teaching was provided. Patient instructed to restart home ASA on day of catheter removal (10 days post-operatively).  Laboratory values:  Recent Labs  01/21/15 1215 01/22/15 0410 01/23/15 0618  HGB 10.4* 9.0* 8.8*  HCT 30.8* 26.9* 26.2*    Recent Labs  01/22/15 0410 01/23/15 0618  CREATININE 0.85 0.83    Disposition: Home  Discharge instruction: The patient was instructed to be ambulatory but told to refrain from heavy lifting, strenuous activity, or driving.   You have a Foley catheter in place which drains urine out of your bladder. There are two parts: one part has a  number and likely a colored band around it - this port should NOT be manipulated; the other piece connects to the drainage tubing and drainage bag. Before discharge from the hospital, your nurse will instruct you how to care for your foley catheter.   A foley catheter drains your bladder by gravity. The drainage bag should always be below the level of your bladder. If you are wearing a leg bag, it should be below your waist, and at night, the bag should lay on the floor next to you in bed.   Sometimes, a piece of tissue or a blood clot can get caught in the foley catheter and cause it not to drain properly. In this case, you may leak urine around the catheter and you may feel like your bladder is full. In this case, you should disconnect the catheter from the drainage bag and flush the catheter with ~30cc of saline (available at [facility]). If this doesn't help, you should come in to be evaluated.  Other times, even though your foley catheter is draining well, you have the feeling that you have a full bladder, and you may leak around your catheter during painful bladder spasms. If this is the case, a medication called oxybutynin (or Ditropan) may help. It is normal to see some blood in your urine from time to time when you have a foley catheter. This can be due to irritation from the foley catheter inside the bladder. However, if your urine is the color of tomato juice with quarter-sized clots, this can clog the catheter, and you should call us for instructions.  A physician will likely need to evaluate you.  If you are unable to get in touch with anyone and you feel it truly is an emergency, proceed to the nearest ER or call an ambulance. 1.  Activity:  You are encouraged to ambulate frequently (about every hour during waking hours) to help prevent blood clots from forming in your legs or lungs.  However, you should not engage in any heavy lifting (> 10-15 lbs), strenuous activity, or straining. 2. Diet: You  should advance your diet as instructed by your physician.  It will be normal to have some bloating, nausea, and abdominal discomfort intermittently. 3. Prescriptions:  You will be provided a prescription for pain medication to take as needed.  If your pain is not severe enough to require the prescription pain medication, you may take extra strength Tylenol instead which will have less side effects.  You should also take a prescribed stool softener to avoid straining with bowel movements as the prescription pain medication may constipate you. 4. Incisions: You may remove your dressing bandages 48 hours after surgery if not removed in the hospital.  You will either have some small staples or special tissue glue at each of the incision sites. Once the bandages are removed (if present), the incisions may stay open to air.  You may start showering (but not soaking or bathing in water) the 2nd day after surgery and the incisions simply need to be patted dry after the shower.  No additional care is needed. 5. What to call us about: You should call the office [phone]) if you develop fever > 101 or develop persistent vomiting. 6.   Discharge medications:    Medication List    STOP taking these medications        aspirin EC 81 MG tablet     finasteride 5 MG tablet  Commonly known as:  PROSCAR     silodosin 8 MG Caps capsule  Commonly known as:  RAPAFLO      TAKE these medications        COQ10 PO  Take 1 tablet by mouth daily.     docusate sodium 100 MG capsule  Commonly known as:  COLACE  Take 1 capsule (100 mg total) by mouth 2 (two) times daily.     ezetimibe-simvastatin 10-80 MG per tablet  Commonly known as:  VYTORIN  Take 0.5 tablets by mouth at bedtime.     glimepiride 4 MG tablet  Commonly known as:  AMARYL  Take 4 mg by mouth 2 (two) times daily.     Melatonin 5 MG Tabs  Take 5 mg by mouth at bedtime as needed (for sleep).     meloxicam 15 MG tablet  Commonly known as:   MOBIC  Take 15 mg by mouth daily.     metFORMIN 500 MG tablet  Commonly known as:  GLUCOPHAGE  Take 1,000 mg by mouth 2 (two) times daily with a meal.     oxybutynin 5 MG tablet  Commonly known as:  DITROPAN  Take 1 tablet (5 mg total) by mouth 3 (three) times daily as needed for bladder spasms.     oxyCODONE-acetaminophen 5-325 MG per tablet  Commonly known as:  ROXICET  Take 1-2 tablets by mouth every 4 (four) hours as needed for severe pain.     sitaGLIPtin 100 MG tablet  Commonly known as:  JANUVIA  Take 100 mg by mouth daily.     sulfamethoxazole-trimethoprim 800-160 MG per tablet  Commonly known as:  BACTRIM DS,SEPTRA DS  Take 1 tablet by mouth every 12 (twelve) hours.        Followup:      Follow-up Information    Follow up with Ailene Rud, MD.   Specialty:  Urology   Why:  as scheduled   Contact information:   [address] [id number] [phone]

## 2015-01-23 NOTE — Care Management Important Message (Signed)
Important Message  Patient Details  Name: Samuel Ramos MRN: [id number] Date of Birth: February 14, 1941   Medicare Important Message Given:  Yes-second notification given    Camillo Flaming 01/23/2015, 10:50 AMImportant Message  Patient Details  Name: Samuel Ramos MRN: [id number] Date of Birth: 1940/12/21   Medicare Important Message Given:  Yes-second notification given    Camillo Flaming 01/23/2015, 10:50 AM

## 2015-01-23 NOTE — Progress Notes (Signed)
UROLOGY PROGRESS NOTES  Assessment/Plan: Samuel Ramos is a 74 y.o. male with history of BPH, DM status post open retropubic simple prostatectomy 01/21/15. Currently stable and progressing well. Pathology benign adenoma.  Neuro:  - Pain controlled on PO med -d/c toradol IV  CV:  - HDS  Pulmonary:  - stable on RA  FEN/GI:  - Saline lock IV - Diet: diabetic regular - Replace electrolytes as needed  GU: - Monitor urine output - 22 Fr hematuria catheter in place, irrigate as needed if catheter stops draining (60 cc in balloon port) will remain in place for 2 weeks -JP drain in LEFT pelvis, 15 total out, decreasing to 0cc over last 7 hours -Neosporin to catheter for comfort  Heme/ID - Starting Hb 13.6, post-op 10.4-->9.0-->8.8. aymptomatic - Bactrim through catheter removal  Prophylaxis: - IS, OOB,  Disposition: - Floor status - Anticipate possible discharge later today  Subjective: NAEON. Pain controlled. No bladder spasms. Catheter draining pink clear urine. Ambulating in hall.  Objective:  Vital signs in last 24 hours: BP 110/57 mmHg  Pulse 66  Temp(Src) 98.1 F (36.7 C) (Oral)  Resp 16  Ht 6\' 4"  (1.93 m)  Wt 88.451 kg (195 lb)  BMI 23.75 kg/m2  SpO2 97%   Intake/Output last 24 hours: I/O last 3 completed shifts: In: 3212.5 [P.O.:840; I.V.:2322.5; IV Piggyback:50] Out: 2263 [Urine:4700; Drains:55]     Physical Exam: General: No acute distress Pulmonary: Normal work of breathing Cardiovascular: Pulse regular rate and rhythm Abdomen: Soft, nontender, nondistended GU: foley in place draining pink, thin urine without clot Extremities: Warm and well perfused Neuro: Intact, no obvious deficits  Data Review: WBC      Lab Results  Component Value Date   WBC 6.5 01/23/2015   HGB 8.8* 01/23/2015   HCT 26.2* 01/23/2015   PLT 125* 01/23/2015     Lab Results  Component Value Date   NA 139 01/23/2015   K 3.8 01/23/2015   CL 106 01/23/2015   CO2 27  01/23/2015   BUN 9 01/23/2015   CREATININE 0.83 01/23/2015   CALCIUM 7.8* 01/23/2015   Pathology: Diagnosis Prostate Resection - BENIGN PROSTATIC TISSUE WITH STROMAL AND GLANDULAR HYPERPLASIA. - ACUTE AND CHRONIC INFLAMMATION. - NO MALIGNANCY IDENTIFIED.

## 2015-01-30 DIAGNOSIS — N401 Enlarged prostate with lower urinary tract symptoms: Secondary | ICD-10-CM | POA: Diagnosis not present

## 2015-01-30 DIAGNOSIS — R3915 Urgency of urination: Secondary | ICD-10-CM | POA: Diagnosis not present

## 2015-02-11 DIAGNOSIS — R339 Retention of urine, unspecified: Secondary | ICD-10-CM | POA: Diagnosis not present

## 2015-02-11 DIAGNOSIS — M6281 Muscle weakness (generalized): Secondary | ICD-10-CM | POA: Diagnosis not present

## 2015-02-28 DIAGNOSIS — R312 Other microscopic hematuria: Secondary | ICD-10-CM | POA: Diagnosis not present

## 2015-02-28 DIAGNOSIS — N390 Urinary tract infection, site not specified: Secondary | ICD-10-CM | POA: Diagnosis not present

## 2015-03-06 DIAGNOSIS — M6281 Muscle weakness (generalized): Secondary | ICD-10-CM | POA: Diagnosis not present

## 2015-03-06 DIAGNOSIS — N401 Enlarged prostate with lower urinary tract symptoms: Secondary | ICD-10-CM | POA: Diagnosis not present

## 2015-03-06 DIAGNOSIS — E291 Testicular hypofunction: Secondary | ICD-10-CM | POA: Diagnosis not present

## 2015-03-06 DIAGNOSIS — N138 Other obstructive and reflux uropathy: Secondary | ICD-10-CM | POA: Diagnosis not present

## 2015-04-03 DIAGNOSIS — M6281 Muscle weakness (generalized): Secondary | ICD-10-CM | POA: Diagnosis not present

## 2015-04-03 DIAGNOSIS — N401 Enlarged prostate with lower urinary tract symptoms: Secondary | ICD-10-CM | POA: Diagnosis not present

## 2015-04-03 DIAGNOSIS — N138 Other obstructive and reflux uropathy: Secondary | ICD-10-CM | POA: Diagnosis not present

## 2015-04-16 DIAGNOSIS — Z23 Encounter for immunization: Secondary | ICD-10-CM | POA: Diagnosis not present

## 2015-04-16 DIAGNOSIS — Z79899 Other long term (current) drug therapy: Secondary | ICD-10-CM | POA: Diagnosis not present

## 2015-04-16 DIAGNOSIS — E7800 Pure hypercholesterolemia, unspecified: Secondary | ICD-10-CM | POA: Diagnosis not present

## 2015-04-16 DIAGNOSIS — Z1389 Encounter for screening for other disorder: Secondary | ICD-10-CM | POA: Diagnosis not present

## 2015-04-16 DIAGNOSIS — Z0000 Encounter for general adult medical examination without abnormal findings: Secondary | ICD-10-CM | POA: Diagnosis not present

## 2015-04-16 DIAGNOSIS — E1165 Type 2 diabetes mellitus with hyperglycemia: Secondary | ICD-10-CM | POA: Diagnosis not present

## 2015-04-16 DIAGNOSIS — I10 Essential (primary) hypertension: Secondary | ICD-10-CM | POA: Diagnosis not present

## 2015-04-16 DIAGNOSIS — E119 Type 2 diabetes mellitus without complications: Secondary | ICD-10-CM | POA: Diagnosis not present

## 2015-05-30 DIAGNOSIS — Z23 Encounter for immunization: Secondary | ICD-10-CM | POA: Diagnosis not present

## 2015-07-03 DIAGNOSIS — E291 Testicular hypofunction: Secondary | ICD-10-CM | POA: Diagnosis not present

## 2015-07-10 DIAGNOSIS — E291 Testicular hypofunction: Secondary | ICD-10-CM | POA: Diagnosis not present

## 2015-08-11 ENCOUNTER — Encounter (HOSPITAL_COMMUNITY): Payer: Self-pay | Admitting: Emergency Medicine

## 2015-08-11 ENCOUNTER — Observation Stay (HOSPITAL_COMMUNITY): Payer: Medicare Other

## 2015-08-11 ENCOUNTER — Observation Stay (HOSPITAL_COMMUNITY)
Admission: EM | Admit: 2015-08-11 | Discharge: 2015-08-12 | Disposition: A | Discharge status: Home / Self Care | Payer: Medicare Other | Attending: Internal Medicine | Admitting: Internal Medicine

## 2015-08-11 ENCOUNTER — Emergency Department (HOSPITAL_COMMUNITY): Payer: Medicare Other

## 2015-08-11 DIAGNOSIS — Z7982 Long term (current) use of aspirin: Secondary | ICD-10-CM | POA: Insufficient documentation

## 2015-08-11 DIAGNOSIS — N200 Calculus of kidney: Secondary | ICD-10-CM | POA: Diagnosis not present

## 2015-08-11 DIAGNOSIS — R079 Chest pain, unspecified: Secondary | ICD-10-CM | POA: Diagnosis not present

## 2015-08-11 DIAGNOSIS — N400 Enlarged prostate without lower urinary tract symptoms: Secondary | ICD-10-CM | POA: Insufficient documentation

## 2015-08-11 DIAGNOSIS — E119 Type 2 diabetes mellitus without complications: Secondary | ICD-10-CM

## 2015-08-11 DIAGNOSIS — Z85820 Personal history of malignant melanoma of skin: Secondary | ICD-10-CM | POA: Insufficient documentation

## 2015-08-11 DIAGNOSIS — K859 Acute pancreatitis without necrosis or infection, unspecified: Secondary | ICD-10-CM | POA: Diagnosis not present

## 2015-08-11 DIAGNOSIS — N132 Hydronephrosis with renal and ureteral calculous obstruction: Secondary | ICD-10-CM | POA: Diagnosis not present

## 2015-08-11 DIAGNOSIS — K853 Drug induced acute pancreatitis without necrosis or infection: Secondary | ICD-10-CM

## 2015-08-11 DIAGNOSIS — D509 Iron deficiency anemia, unspecified: Secondary | ICD-10-CM

## 2015-08-11 DIAGNOSIS — E1165 Type 2 diabetes mellitus with hyperglycemia: Secondary | ICD-10-CM | POA: Diagnosis not present

## 2015-08-11 DIAGNOSIS — R110 Nausea: Secondary | ICD-10-CM | POA: Diagnosis not present

## 2015-08-11 DIAGNOSIS — K8689 Other specified diseases of pancreas: Secondary | ICD-10-CM

## 2015-08-11 DIAGNOSIS — Z7984 Long term (current) use of oral hypoglycemic drugs: Secondary | ICD-10-CM | POA: Insufficient documentation

## 2015-08-11 DIAGNOSIS — E7800 Pure hypercholesterolemia, unspecified: Secondary | ICD-10-CM

## 2015-08-11 HISTORY — DX: Diverticulitis of intestine, part unspecified, without perforation or abscess without bleeding: K57.92

## 2015-08-11 HISTORY — DX: Benign prostatic hyperplasia without lower urinary tract symptoms: N40.0

## 2015-08-11 HISTORY — DX: Acute pancreatitis without necrosis or infection, unspecified: K85.90

## 2015-08-11 LAB — HEPATIC FUNCTION PANEL
ALT: 16 U/L — ABNORMAL LOW (ref 17–63)
AST: 18 U/L (ref 15–41)
Albumin: 3.4 g/dL — ABNORMAL LOW (ref 3.5–5.0)
Alkaline Phosphatase: 40 U/L (ref 38–126)
BILIRUBIN DIRECT: 0.1 mg/dL (ref 0.1–0.5)
BILIRUBIN INDIRECT: 0.5 mg/dL (ref 0.3–0.9)
Total Bilirubin: 0.6 mg/dL (ref 0.3–1.2)
Total Protein: 6.1 g/dL — ABNORMAL LOW (ref 6.5–8.1)

## 2015-08-11 LAB — CBC
HCT: 37.9 % — ABNORMAL LOW (ref 39.0–52.0)
Hemoglobin: 12.9 g/dL — ABNORMAL LOW (ref 13.0–17.0)
MCH: 29 pg (ref 26.0–34.0)
MCHC: 34 g/dL (ref 30.0–36.0)
MCV: 85.2 fL (ref 78.0–100.0)
Platelets: 163 10*3/uL (ref 150–400)
RBC: 4.45 MIL/uL (ref 4.22–5.81)
RDW: 13.4 % (ref 11.5–15.5)
WBC: 6.6 10*3/uL (ref 4.0–10.5)

## 2015-08-11 LAB — BASIC METABOLIC PANEL
ANION GAP: 10 (ref 5–15)
BUN: 9 mg/dL (ref 6–20)
CALCIUM: 9.4 mg/dL (ref 8.9–10.3)
CO2: 26 mmol/L (ref 22–32)
Chloride: 102 mmol/L (ref 101–111)
Creatinine, Ser: 0.83 mg/dL (ref 0.61–1.24)
GFR calc Af Amer: >60 mL/min (ref >60)
GLUCOSE: 230 mg/dL — AB (ref 65–99)
Potassium: 3.7 mmol/L (ref 3.5–5.1)
Sodium: 138 mmol/L (ref 135–145)

## 2015-08-11 LAB — TRIGLYCERIDES: Triglycerides: 131 mg/dL (ref <150)

## 2015-08-11 LAB — I-STAT TROPONIN, ED: TROPONIN I, POC: 0 ng/mL (ref 0.00–0.08)

## 2015-08-11 LAB — GLUCOSE, CAPILLARY
GLUCOSE-CAPILLARY: 131 mg/dL — AB (ref 65–99)
GLUCOSE-CAPILLARY: 213 mg/dL — AB (ref 65–99)

## 2015-08-11 LAB — LACTATE DEHYDROGENASE: LDH: 158 U/L (ref 98–192)

## 2015-08-11 LAB — ETHANOL

## 2015-08-11 LAB — LIPASE, BLOOD: Lipase: 1650 U/L — ABNORMAL HIGH (ref 11–51)

## 2015-08-11 MED ORDER — SODIUM CHLORIDE 0.9 % IV SOLN: INTRAVENOUS | Status: DC

## 2015-08-11 MED ORDER — INSULIN ASPART 100 UNIT/ML ~~LOC~~ SOLN
0.0000 [IU] | Freq: Three times a day (TID) | SUBCUTANEOUS | Status: DC
  Administered 2015-08-12 (×2): 3 [IU] via SUBCUTANEOUS

## 2015-08-11 MED ORDER — ONDANSETRON HCL 4 MG/2ML IJ SOLN
4.0000 mg | Freq: Four times a day (QID) | INTRAMUSCULAR | Status: DC | PRN
Start: 2015-08-11 — End: 2015-08-12

## 2015-08-11 MED ORDER — SODIUM CHLORIDE 0.9 % IV SOLN
INTRAVENOUS | Status: DC
  Administered 2015-08-11: 14:00:00 via INTRAVENOUS

## 2015-08-11 MED ORDER — MELATONIN 3 MG PO TABS
3.0000 mg | ORAL_TABLET | Freq: Every evening | ORAL | Status: DC | PRN
  Administered 2015-08-11: 3 mg via ORAL
  Filled 2015-08-11 (×2): qty 1

## 2015-08-11 MED ORDER — ASPIRIN 81 MG PO CHEW
81.0000 mg | CHEWABLE_TABLET | Freq: Every day | ORAL | Status: DC
  Administered 2015-08-12: 81 mg via ORAL
  Filled 2015-08-11: qty 1

## 2015-08-11 MED ORDER — IOHEXOL 300 MG/ML  SOLN
100.0000 mL | Freq: Once | INTRAMUSCULAR | Status: AC | PRN
Start: 2015-08-11 — End: 2015-08-11
  Administered 2015-08-11: 100 mL via INTRAVENOUS

## 2015-08-11 MED ORDER — IOHEXOL 300 MG/ML  SOLN
25.0000 mL | Freq: Once | INTRAMUSCULAR | Status: AC | PRN
  Administered 2015-08-11: 25 mL via ORAL

## 2015-08-11 MED ORDER — ONDANSETRON HCL 4 MG/2ML IJ SOLN: 4.0000 mg | Freq: Three times a day (TID) | INTRAMUSCULAR | Status: DC | PRN

## 2015-08-11 MED ORDER — INSULIN ASPART 100 UNIT/ML ~~LOC~~ SOLN: 0.0000 [IU] | Freq: Three times a day (TID) | SUBCUTANEOUS | Status: DC

## 2015-08-11 MED ORDER — FERROUS FUMARATE 325 (106 FE) MG PO TABS
1.0000 | ORAL_TABLET | Freq: Every day | ORAL | Status: DC
  Administered 2015-08-12: 106 mg via ORAL
  Filled 2015-08-11 (×2): qty 1

## 2015-08-11 MED ORDER — ACETAMINOPHEN 325 MG PO TABS: 650.0000 mg | ORAL_TABLET | Freq: Four times a day (QID) | ORAL | Status: DC | PRN

## 2015-08-11 MED ORDER — ACETAMINOPHEN 650 MG RE SUPP: 650.0000 mg | Freq: Four times a day (QID) | RECTAL | Status: DC | PRN

## 2015-08-11 MED ORDER — SODIUM CHLORIDE 0.9 % IV BOLUS (SEPSIS)
1000.0000 mL | Freq: Once | INTRAVENOUS | Status: AC
  Administered 2015-08-11: 1000 mL via INTRAVENOUS

## 2015-08-11 MED ORDER — ENOXAPARIN SODIUM 40 MG/0.4ML ~~LOC~~ SOLN
40.0000 mg | SUBCUTANEOUS | Status: DC
  Filled 2015-08-11: qty 0.4

## 2015-08-11 MED ORDER — ONDANSETRON HCL 4 MG PO TABS: 4.0000 mg | ORAL_TABLET | Freq: Four times a day (QID) | ORAL | Status: DC | PRN

## 2015-08-11 MED ORDER — SIMVASTATIN 40 MG PO TABS
40.0000 mg | ORAL_TABLET | Freq: Every day | ORAL | Status: DC
  Filled 2015-08-11: qty 1

## 2015-08-11 MED ORDER — INSULIN ASPART 100 UNIT/ML ~~LOC~~ SOLN
0.0000 [IU] | Freq: Every day | SUBCUTANEOUS | Status: DC
  Administered 2015-08-11: 2 [IU] via SUBCUTANEOUS

## 2015-08-11 NOTE — ED Provider Notes (Signed)
CSN: [id number]     Arrival date & time 08/11/15  [id number] History   First MD Initiated Contact with Patient 08/11/15 [phone]     Chief Complaint  Patient presents with  . Chest Pain     (Consider location/radiation/quality/duration/timing/severity/associated sxs/prior Treatment) HPI Comments: Patient with history of diabetes on oral anti-hyperglycemics -- presents with complaint of chest pain starting approximately 4 PM yesterday. Pain is in the lower portion of the right central chest. It does not radiate. Patient has had waxing and waning pain since that time. Associated nausea but no vomiting. Patient broke out in a cold sweat this morning. No palpitations. Patient has not had similar symptoms in the past. No recent chest pain with exertion. Patient has been active over the past several days power washing and doing other chores. Pain is not worse with movements or palpation. Patient denies risk factors for pulmonary embolism including: unilateral leg swelling, history of DVT/PE/other blood clots, use of exogenous hormones, recent immobilizations, recent surgery, recent travel (>4hr segment), malignancy, hemoptysis. The onset of this condition was acute.  Aggravating factors: none. Alleviating factors: none.    The history is provided by the patient, medical records and a relative.    Past Medical History  Diagnosis Date  . Diabetes mellitus without complication ([place])   . Renal stone   . Cancer [facility]     melanoma let arm removed   Past Surgical History  Procedure Laterality Date  . Tonsillectomy    . Shoulder arthroscopy with open rotator cuff repair  2002    right  . Right leg broken (tibia and fibula) 1961    . Wisdom tooth extraction    . Prostatectomy N/A 01/21/2015    Procedure: PROSTATECTOMY RETROPUBIC;  Surgeon: Carolan Clines, MD;  Location: [facility];  Service: Urology;  Laterality: N/A;   No family history on file. Social History  Substance Use Topics  . Smoking status:  Never Smoker   . Smokeless tobacco: Never Used  . Alcohol Use: No     Comment: 1 per week    Review of Systems  Constitutional: Positive for diaphoresis. Negative for fever.  Eyes: Negative for redness.  Respiratory: Negative for cough and shortness of breath.   Cardiovascular: Positive for chest pain. Negative for palpitations and leg swelling.  Gastrointestinal: Positive for nausea. Negative for vomiting and abdominal pain.  Genitourinary: Negative for dysuria.  Musculoskeletal: Negative for back pain and neck pain.  Skin: Negative for rash.  Neurological: Negative for syncope and light-headedness.  Psychiatric/Behavioral: The patient is not nervous/anxious.       Allergies  Vicodin  Home Medications   Prior to Admission medications   Medication Sig Start Date End Date Taking? Authorizing Provider  Coenzyme Q10 (COQ10 PO) Take 1 tablet by mouth daily.    Historical Provider, MD  docusate sodium (COLACE) 100 MG capsule Take 1 capsule (100 mg total) by mouth 2 (two) times daily. 01/22/15   Star Age, MD  ezetimibe-simvastatin (VYTORIN) 10-80 MG per tablet Take 0.5 tablets by mouth at bedtime.    Historical Provider, MD  glimepiride (AMARYL) 4 MG tablet Take 4 mg by mouth 2 (two) times daily.    Historical Provider, MD  Melatonin 5 MG TABS Take 5 mg by mouth at bedtime as needed (for sleep).    Historical Provider, MD  meloxicam (MOBIC) 15 MG tablet Take 15 mg by mouth daily.    Historical Provider, MD  metFORMIN (GLUCOPHAGE) 500 MG tablet Take 1,000 mg by  mouth 2 (two) times daily with a meal.    Historical Provider, MD  oxybutynin (DITROPAN) 5 MG tablet Take 1 tablet (5 mg total) by mouth 3 (three) times daily as needed for bladder spasms. 01/22/15   Star Age, MD  oxyCODONE-acetaminophen (ROXICET) 5-325 MG per tablet Take 1-2 tablets by mouth every 4 (four) hours as needed for severe pain. 01/22/15   Star Age, MD  sitaGLIPtin (JANUVIA) 100 MG tablet Take 100 mg by  mouth daily.    Historical Provider, MD  sulfamethoxazole-trimethoprim (BACTRIM DS,SEPTRA DS) 800-160 MG per tablet Take 1 tablet by mouth every 12 (twelve) hours. 01/22/15   Star Age, MD   BP 138/81 mmHg  Pulse 66  Temp(Src) 98.1 F (36.7 C) (Oral)  Resp 16  Ht 6\' 4"  (1.93 m)  Wt 87.998 kg  BMI 23.62 kg/m2  SpO2 100%   Physical Exam  Constitutional: He appears well-developed and well-nourished.  HENT:  Head: Normocephalic and atraumatic.  Mouth/Throat: Mucous membranes are normal. Mucous membranes are not dry.  Eyes: Conjunctivae are normal.  Neck: Trachea normal and normal range of motion. Neck supple. Normal carotid pulses and no JVD present. No muscular tenderness present. Carotid bruit is not present. No tracheal deviation present.  Cardiovascular: Normal rate, regular rhythm, S1 normal, S2 normal, normal heart sounds and intact distal pulses.  Exam reveals no distant heart sounds and no decreased pulses.   No murmur heard. Pulmonary/Chest: Effort normal and breath sounds normal. No respiratory distress. He has no wheezes. He exhibits no tenderness.  Abdominal: Soft. Normal aorta and bowel sounds are normal. There is no tenderness. There is no rebound and no guarding.  Musculoskeletal: He exhibits no edema.  Neurological: He is alert.  Skin: Skin is warm and dry. He is not diaphoretic. No cyanosis. No pallor.  Psychiatric: He has a normal mood and affect.  Nursing note and vitals reviewed.   ED Course  Procedures (including critical care time) Labs Review Labs Reviewed  BASIC METABOLIC PANEL - Abnormal; Notable for the following:    Glucose, Bld 230 (*)    All other components within normal limits  CBC - Abnormal; Notable for the following:    Hemoglobin 12.9 (*)    HCT 37.9 (*)    All other components within normal limits  HEPATIC FUNCTION PANEL - Abnormal; Notable for the following:    Total Protein 6.1 (*)    Albumin 3.4 (*)    ALT 16 (*)    All other  components within normal limits  LIPASE, BLOOD - Abnormal; Notable for the following:    Lipase 1650 (*)    All other components within normal limits  LACTATE DEHYDROGENASE  ETHANOL  TRIGLYCERIDES  I-STAT TROPOININ, ED    Imaging Review Dg Chest 2 View  08/11/2015  CLINICAL DATA:  Right chest pain and nausea beginning yesterday EXAM: CHEST  2 VIEW COMPARISON:  None. FINDINGS: The heart size and mediastinal contours are within normal limits. Both lungs are clear. The visualized skeletal structures are unremarkable. IMPRESSION: No active cardiopulmonary disease. Electronically Signed   By: Rolm Baptise M.D.   On: 08/11/2015 09:48   I have personally reviewed and evaluated these images and lab results as part of my medical decision-making.   EKG Interpretation   Date/Time:  Sunday August 11 2015 08:55:45 EST Ventricular Rate:  65 PR Interval:  175 QRS Duration: 100 QT Interval:  423 QTC Calculation: 440 R Axis:   -14 Text Interpretation:  Sinus rhythm  Normal ECG No significant change since  last tracing Confirmed by KNOTT MD, DANIEL [phone]) on 08/11/2015 9:42:51 AM       9:18 AM Patient seen and examined. Work-up initiated. Medications ordered.   Vital signs reviewed and are as follows: BP 138/81 mmHg  Pulse 66  Temp(Src) 98.1 F (36.7 C) (Oral)  Resp 16  Ht 6\' 4"  (1.93 m)  Wt 87.998 kg  BMI 23.62 kg/m2  SpO2 100%  11:05 AM Pt stable. Admit for acute pancreatitis.   MDM   Final diagnoses:  Acute pancreatitis, unspecified complication status, unspecified pancreatitis type   Admit. Possible medication etiology.     Carlisle Cater, PA-C 08/11/15 1105  Leo Grosser, MD 08/12/15 1318

## 2015-08-11 NOTE — ED Notes (Signed)
Report given to [address] RN.

## 2015-08-11 NOTE — H&P (Signed)
[facility] History and Physical  Samuel Ramos [id number] DOB: 03-04-1941 DOA: 08/11/2015  Referring physician:  Carlisle Cater PCP:  Mathews Argyle, MD   Chief Complaint:  Abdominal pain  HPI:  The patient is a 75 y.o. year-old male with history of diabetes mellitus type 2, kidney stones, melanoma of the left arm and back which has been removed, distant history of severe pancreatitis, possible stable small pseudocyst at the tail the pancreas, diverticulitis, BPH who presents with epigastric pain.  The patient was last at their baseline health until yesterday evening when he developed some 6 out of 10 sharp pain at the right upper quadrant and epigastric area without radiation to the back or to the shoulder, not associated with shortness of breath, nausea, vomiting. He did lose his appetite. The pain would come and go every few minutes but at that time it started to resolve. He did not take any pain medication. This morning when he woke he continued to have 6 out of 10 pain that did not improve and he developed some diaphoresis. He continued to have no appetite and only had a little bit to drink since yesterday evening. He was worried about heart attack and came to the emergency department. He denied any recent fevers, chills, vomiting, diarrhea, dysuria.     In the emergency room, his vital signs are stable. His labs were notable for mild anemia and hyperglycemia to 230, normal LFTs and bilirubin but a lipase of 1650. His alcohol level was negative.  TG pending.    Review of Systems:  General:  Denies fevers, chills, weight loss or gain HEENT:  Denies changes to hearing and vision, rhinorrhea, sinus congestion, sore throat CV:  Denies chest pain and palpitations, lower extremity edema.  PULM:  Denies SOB, wheezing, cough.   GI:  Denies nausea, vomiting, constipation, diarrhea.   GU:  Denies dysuria, frequency, urgency ENDO:  Denies polyuria, polydipsia.   HEME:  Denies  hematemesis, blood in stools, melena, abnormal bruising or bleeding.  LYMPH:  Denies lymphadenopathy.   MSK:  Denies arthralgias, myalgias.   DERM:  Denies skin rash or ulcer.   NEURO:  Denies focal numbness, weakness, slurred speech, confusion, facial droop.  PSYCH:  Denies anxiety and depression.    Past Medical History  Diagnosis Date  . Diabetes mellitus without complication ([place])   . Renal stone   . Cancer ([place])     melanoma left arm and back removed  . Pancreatitis     many years ago  . Diverticulitis   . BPH (benign prostatic hyperplasia)    Past Surgical History  Procedure Laterality Date  . Tonsillectomy    . Shoulder arthroscopy with open rotator cuff repair  2002    right  . Right leg broken (tibia and fibula) 1961    . Wisdom tooth extraction    . Prostatectomy N/A 01/21/2015    Procedure: PROSTATECTOMY RETROPUBIC;  Surgeon: Carolan Clines, MD;  Location: [facility];  Service: Urology;  Laterality: N/A;   Social History:  reports that he has never smoked. He has never used smokeless tobacco. He reports that he does not drink alcohol or use illicit drugs.  Allergies  Allergen Reactions  . Vicodin [Hydrocodone-Acetaminophen] Other (See Comments)    Nightmares    Family History  Problem Relation Age of Onset  . Melanoma Father   . Pancreatitis Neg Hx   . Lymphoma Sister   . Uterine cancer Mother      Prior to  Admission medications   Medication Sig Start Date End Date Taking? Authorizing Provider  aspirin 81 MG tablet Take 81 mg by mouth daily.   Yes Historical Provider, MD  Coenzyme Q10 (COQ10 PO) Take 1 tablet by mouth daily.   Yes Historical Provider, MD  ferrous fumarate (HEMOCYTE - 106 MG FE) 325 (106 Fe) MG TABS tablet Take 1 tablet by mouth daily.   Yes Historical Provider, MD  glimepiride (AMARYL) 4 MG tablet Take 4 mg by mouth 2 (two) times daily.   Yes Historical Provider, MD  Melatonin 5 MG TABS Take 5 mg by mouth at bedtime as needed (for sleep).    Yes Historical Provider, MD  metFORMIN (GLUCOPHAGE) 500 MG tablet Take 1,000 mg by mouth 2 (two) times daily with a meal.   Yes Historical Provider, MD  simvastatin (ZOCOR) 40 MG tablet Take 40 mg by mouth daily at 6 PM.  07/14/15  Yes Historical Provider, MD  sitaGLIPtin (JANUVIA) 100 MG tablet Take 100 mg by mouth daily.   Yes Historical Provider, MD  docusate sodium (COLACE) 100 MG capsule Take 1 capsule (100 mg total) by mouth 2 (two) times daily. Patient not taking: Reported on 08/11/2015 01/22/15   Star Age, MD  oxybutynin (DITROPAN) 5 MG tablet Take 1 tablet (5 mg total) by mouth 3 (three) times daily as needed for bladder spasms. Patient not taking: Reported on 08/11/2015 01/22/15   Star Age, MD  oxyCODONE-acetaminophen (ROXICET) 5-325 MG per tablet Take 1-2 tablets by mouth every 4 (four) hours as needed for severe pain. Patient not taking: Reported on 08/11/2015 01/22/15   Star Age, MD  sulfamethoxazole-trimethoprim (BACTRIM DS,SEPTRA DS) 800-160 MG per tablet Take 1 tablet by mouth every 12 (twelve) hours. Patient not taking: Reported on 08/11/2015 01/22/15   Star Age, MD   Physical Exam: Filed Vitals:   08/11/15 1130 08/11/15 1145 08/11/15 1156 08/11/15 1200  BP: 113/80  113/80 121/62  Pulse: 64 65 60 66  Temp:      TempSrc:      Resp: 19 12 16 19   Height:      Weight:      SpO2: 99% 100% 100% 100%     General:  Obese male, no acute distress  Eyes:  PERRL, anicteric, non-injected.  ENT:  Nares clear.  OP clear, non-erythematous without plaques or exudates.  MMM.  Neck:  Supple without TM or JVD.    Lymph:  No cervical, supraclavicular, or submandibular LAD.  Cardiovascular:  RRR, normal S1, S2, without m/r/g.  2+ pulses, warm extremities  Respiratory:  CTA bilaterally without increased WOB.  Abdomen:  NABS.  Soft, ND, minimal tenderness to palpation in the epigastric region  Skin:  No rashes or focal lesions.  Musculoskeletal:  Normal bulk and tone.   No LE edema.  Psychiatric:  A & O x 4.  Appropriate affect.  Neurologic:  CN 3-12 intact.  5/5 strength.  Sensation intact.  Labs on Admission:  Basic Metabolic Panel:  Recent Labs Lab 08/11/15 0912  NA 138  K 3.7  CL 102  CO2 26  GLUCOSE 230*  BUN 9  CREATININE 0.83  CALCIUM 9.4   Liver Function Tests:  Recent Labs Lab 08/11/15 0924  AST 18  ALT 16*  ALKPHOS 40  BILITOT 0.6  PROT 6.1*  ALBUMIN 3.4*    Recent Labs Lab 08/11/15 0924  LIPASE 1650*   No results for input(s): AMMONIA in the last 168 hours. CBC:  Recent Labs Lab  08/11/15 0912  WBC 6.6  HGB 12.9*  HCT 37.9*  MCV 85.2  PLT 163   Cardiac Enzymes: No results for input(s): CKTOTAL, CKMB, CKMBINDEX, TROPONINI in the last 168 hours.  BNP (last 3 results) No results for input(s): BNP in the last 8760 hours.  ProBNP (last 3 results) No results for input(s): PROBNP in the last 8760 hours.  CBG: No results for input(s): GLUCAP in the last 168 hours.  Radiological Exams on Admission: Dg Chest 2 View  08/11/2015  CLINICAL DATA:  Right chest pain and nausea beginning yesterday EXAM: CHEST  2 VIEW COMPARISON:  None. FINDINGS: The heart size and mediastinal contours are within normal limits. Both lungs are clear. The visualized skeletal structures are unremarkable. IMPRESSION: No active cardiopulmonary disease. Electronically Signed   By: Rolm Baptise M.D.   On: 08/11/2015 09:48    EKG: Independently reviewed. Normal sinus rhythm  Assessment/Plan Active Problems:   Acute pancreatitis   Diabetes mellitus without complication (HCC)   Hypercholesterolemia   Iron deficiency anemia  ---  Acute pancreatitis, possibly caused by Januvia however this has been an existing medication for a long time. -  Discontinue Januvia -  Check triglycerides, although he says his cholesterol has been okay at his primary care doctor's office -  Alcohol level negative -  Previous CT scans of the last several years  have demonstrated no evidence of gallstones and he does not have elevated alkaline phosphatase or bilirubin to suggest a passed stone -  He had a small mass on the tail of the pancreas which was possibly a pseudocyst on his review CTs. I discussed repeating a CT scan to make sure that this is not a malignancy which has progressed causing his pancreatitis -  Clear liquid diet to advance as tolerated -  IV fluids and antiemetics -  Tylenol when necessary pain since pain is mild with NSAIDs for breakthrough  Diabetes mellitus type 2, reportedly uncontrolled and he is hyperglycemic -  Hold medications and plan to discontinue Januvia -  Start moderate dose sliding scale insulin -  Continue proph asa -  A1c  Iron deficiency anemia, stable, continue iron  Diet:  CLD Access:  PIV IVF:  yes Proph:  lovenox  Code Status: full Family Communication: patient and his wife and two sons Disposition Plan: Admit to [facility].  Home soon.  Time spent: 60 min Janece Canterbury [facility] Pager [phone]  If 7PM-7AM, please contact night-coverage [email or web address] Password [email or web address] 08/11/2015, 12:40 PM

## 2015-08-11 NOTE — ED Notes (Signed)
Pt to ED via [facility]  From home with c/o epigastric/chest pain that started yesterday, rates pain 1/10. No pain on palpation, no no pain with movement. Pt has been cleaning gutters and chopping wood recently. Denies any injury.

## 2015-08-12 DIAGNOSIS — E119 Type 2 diabetes mellitus without complications: Secondary | ICD-10-CM | POA: Diagnosis not present

## 2015-08-12 DIAGNOSIS — E7800 Pure hypercholesterolemia, unspecified: Secondary | ICD-10-CM | POA: Diagnosis not present

## 2015-08-12 DIAGNOSIS — D509 Iron deficiency anemia, unspecified: Secondary | ICD-10-CM | POA: Diagnosis not present

## 2015-08-12 DIAGNOSIS — K859 Acute pancreatitis without necrosis or infection, unspecified: Secondary | ICD-10-CM | POA: Diagnosis not present

## 2015-08-12 DIAGNOSIS — K853 Drug induced acute pancreatitis without necrosis or infection: Secondary | ICD-10-CM | POA: Diagnosis not present

## 2015-08-12 DIAGNOSIS — N200 Calculus of kidney: Secondary | ICD-10-CM

## 2015-08-12 LAB — COMPREHENSIVE METABOLIC PANEL
ALT: 13 U/L — AB (ref 17–63)
AST: 17 U/L (ref 15–41)
Albumin: 2.9 g/dL — ABNORMAL LOW (ref 3.5–5.0)
Alkaline Phosphatase: 33 U/L — ABNORMAL LOW (ref 38–126)
Anion gap: 9 (ref 5–15)
BUN: 7 mg/dL (ref 6–20)
CHLORIDE: 103 mmol/L (ref 101–111)
CO2: 26 mmol/L (ref 22–32)
Calcium: 8.4 mg/dL — ABNORMAL LOW (ref 8.9–10.3)
Creatinine, Ser: 0.84 mg/dL (ref 0.61–1.24)
Glucose, Bld: 271 mg/dL — ABNORMAL HIGH (ref 65–99)
POTASSIUM: 3.7 mmol/L (ref 3.5–5.1)
SODIUM: 138 mmol/L (ref 135–145)
Total Bilirubin: 0.7 mg/dL (ref 0.3–1.2)
Total Protein: 5.1 g/dL — ABNORMAL LOW (ref 6.5–8.1)

## 2015-08-12 LAB — CBC
HEMATOCRIT: 34.5 % — AB (ref 39.0–52.0)
HEMOGLOBIN: 11.9 g/dL — AB (ref 13.0–17.0)
MCH: 29.5 pg (ref 26.0–34.0)
MCHC: 34.5 g/dL (ref 30.0–36.0)
MCV: 85.4 fL (ref 78.0–100.0)
Platelets: 143 10*3/uL — ABNORMAL LOW (ref 150–400)
RBC: 4.04 MIL/uL — AB (ref 4.22–5.81)
RDW: 13.7 % (ref 11.5–15.5)
WBC: 5.9 10*3/uL (ref 4.0–10.5)

## 2015-08-12 LAB — GLUCOSE, CAPILLARY
GLUCOSE-CAPILLARY: 162 mg/dL — AB (ref 65–99)
Glucose-Capillary: 167 mg/dL — ABNORMAL HIGH (ref 65–99)

## 2015-08-12 LAB — URINALYSIS, ROUTINE W REFLEX MICROSCOPIC
BILIRUBIN URINE: NEGATIVE
Glucose, UA: 250 mg/dL — AB
Hgb urine dipstick: NEGATIVE
Ketones, ur: NEGATIVE mg/dL
LEUKOCYTES UA: NEGATIVE
NITRITE: NEGATIVE
PH: 7 (ref 5.0–8.0)
Protein, ur: NEGATIVE mg/dL
SPECIFIC GRAVITY, URINE: 1.017 (ref 1.005–1.030)

## 2015-08-12 LAB — LIPASE, BLOOD: Lipase: 219 U/L — ABNORMAL HIGH (ref 11–51)

## 2015-08-12 MED ORDER — ONDANSETRON 4 MG PO TBDP
4.0000 mg | ORAL_TABLET | Freq: Three times a day (TID) | ORAL | Status: DC | PRN
Start: 2015-08-12 — End: 2018-04-27

## 2015-08-12 NOTE — Discharge Summary (Signed)
Physician Discharge Summary  Samuel Ramos [id number] DOB: Aug 09, 1940 DOA: 08/11/2015  PCP: Mathews Argyle, MD  Admit date: 08/11/2015 Discharge date: 08/12/2015  Recommendations for Outpatient Follow-up:  1. F/u with primary care doctor in 1 week to review diabetes after stopping Januvia  Discharge Diagnoses:  Principal Problem:   Acute pancreatitis Active Problems:   Diabetes mellitus without complication ([place])   Hypercholesterolemia   Iron deficiency anemia   Nephrolithiasis   Discharge Condition: stable, improved  Diet recommendation: diabetic, low fat  Wt Readings from Last 3 Encounters:  08/11/15 87.998 kg (194 lb)  01/21/15 88.451 kg (195 lb)  01/16/15 88.451 kg (195 lb)    History of present illness:   The patient is a 75 y.o. year-old male with history of diabetes mellitus type 2, kidney stones, melanoma of the left arm and back which have been removed, distant history of severe pancreatitis, possible stable small pseudocyst at the tail the pancreas, diverticulitis, BPH who presents with epigastric pain. The patient was last at their baseline health until the night prior to admission when he developed 6 out of 10 sharp pain at the right upper quadrant and epigastric area without radiation to the back or to the shoulder, not associated with shortness of breath, nausea, vomiting. He lost his appetite. The pain would come and go initially but persisted the following morning and he developed some diaphoresis so he presented to the ER.   In the emergency room, his vital signs were stable. His labs were notable for lipase of 1650 with otherwise normal LFTs. His alcohol level was negative. TG 131.   Hospital Course:   Mild acute pancreatitis, likely secondary to Januvia.  His triglycerides were normal, he does not drink alcohol.  He has now had three CT scans over the last 7 years, none of which have documented gallstones, and his bilirubin and alk-phos remained  normal.  I repeated his CT pancreas to make sure the area that previously appeared to be a small pseudocyst had remained unchanged and did not represent a malignancy.  His lipase trended down rapidly with IVF and antiemetics and bowel rest and within 24 hours, he was tolerating a regular diet without pain.  Recommended permanently stopping Januvia and following up with his PCP for ongoing diabetes management.    His CT scan demonstrated some known small kidney stones, none obstructive, with mild left hydronephrosis and he is already followed by Dr. Gaynelle Arabian from Urology.  His UA demonstrated only glucosuria but no hematuria or RBCs and I doubt this was related to his abdominal pain at presentation.    The rest of his medical problems remained stable and unchanged.    Procedures:  CT pancreas  Consultations:  None  Discharge Exam: Filed Vitals:   08/11/15 2122 08/12/15 0632  BP: 143/80 115/62  Pulse: 71 67  Temp: 98.2 F (36.8 C) 98.2 F (36.8 C)  Resp: 18 16   Filed Vitals:   08/11/15 1200 08/11/15 1356 08/11/15 2122 08/12/15 0632  BP: 121/62 125/65 143/80 115/62  Pulse: 66 66 71 67  Temp:  97.9 F (36.6 C) 98.2 F (36.8 C) 98.2 F (36.8 C)  TempSrc:  Oral Oral Oral  Resp: _0 Height:      Weight:      SpO2: 100% 98% 100% 96%    General: adult male, NAD Cardiovascular: RRR, no mrg Respiratory: CTAB ABD:  NABS, soft, NT even with deep palpation, ND MSK:  No LEE  Discharge Instructions      Discharge Instructions    Call MD for:  difficulty breathing, headache or visual disturbances    Complete by:  As directed      Call MD for:  extreme fatigue    Complete by:  As directed      Call MD for:  hives    Complete by:  As directed      Call MD for:  persistant dizziness or light-headedness    Complete by:  As directed      Call MD for:  persistant nausea and vomiting    Complete by:  As directed      Call MD for:  severe uncontrolled pain    Complete  by:  As directed      Call MD for:  temperature >100.4    Complete by:  As directed      Diet Carb Modified    Complete by:  As directed      Discharge instructions    Complete by:  As directed   Please eat a bland diet.  If you pain and nausea return, please start drinking clear liquids and use your nausea medication with tylenol.  Gradually try some bland foods as your pain subsides.  If your pain is severe, please return to the hospital.     Increase activity slowly    Complete by:  As directed             Medication List    TAKE these medications        aspirin 81 MG tablet  Take 81 mg by mouth daily.     COQ10 PO  Take 1 tablet by mouth daily.     ferrous fumarate 325 (106 Fe) MG Tabs tablet  Commonly known as:  HEMOCYTE - 106 mg FE  Take 1 tablet by mouth daily.     glimepiride 4 MG tablet  Commonly known as:  AMARYL  Take 4 mg by mouth 2 (two) times daily.     Melatonin 5 MG Tabs  Take 5 mg by mouth at bedtime as needed (for sleep).     metFORMIN 500 MG tablet  Commonly known as:  GLUCOPHAGE  Take 1,000 mg by mouth 2 (two) times daily with a meal.     ondansetron 4 MG disintegrating tablet  Commonly known as:  ZOFRAN ODT  Take 1 tablet (4 mg total) by mouth every 8 (eight) hours as needed for nausea or vomiting.     simvastatin 40 MG tablet  Commonly known as:  ZOCOR  Take 40 mg by mouth daily at 6 PM.       Follow-up Information    Follow up with Mathews Argyle, MD. Schedule an appointment as soon as possible for a visit in 1 week.   Specialty:  Internal Medicine   Contact information:   [address] [place] [id number] [phone]        The results of significant diagnostics from this hospitalization (including imaging, microbiology, ancillary and laboratory) are listed below for reference.    Significant Diagnostic Studies: Dg Chest 2 View  08/11/2015  CLINICAL DATA:  Right chest pain and nausea beginning yesterday  EXAM: CHEST  2 VIEW COMPARISON:  None. FINDINGS: The heart size and mediastinal contours are within normal limits. Both lungs are clear. The visualized skeletal structures are unremarkable. IMPRESSION: No active cardiopulmonary disease. Electronically Signed   By: Rolm Baptise M.D.   On:  08/11/2015 09:48   Ct Abdomen W Contrast  08/11/2015  CLINICAL DATA:  75 year old male with abdominal pain and follow-up known pancreatic lesion. EXAM: CT ABDOMEN WITH CONTRAST TECHNIQUE: Multidetector CT imaging of the abdomen was performed using the standard protocol following bolus administration of intravenous contrast. CONTRAST:  171m OMNIPAQUE IOHEXOL 300 MG/ML  SOLN COMPARISON:  09/10/2014 and prior CTs dating back to 12/13/2007. FINDINGS: Lower chest:  Upper limits normal heart size again noted. Hepatobiliary: The liver and gallbladder are unremarkable. There is no evidence of biliary dilatation. Pancreas: A 1.3 x 1.5 cm cystic lesion within the body/tail of pancreas has not significantly changed from 2016. A calculus within the pancreatic duct within the pancreatic body with distal pancreatic ductal dilatation is again identified. There is no evidence of acute peripancreatic inflammation or fluid collection. Spleen: Small calcified granulomas are again noted without other significant abnormality. Adrenals/Urinary Tract: A 6 mm calculus within the left renal pelvis and a 3 mm left UPJ calculus identified with mild left hydronephrosis. Contrast does not extend into the ureter on delayed images compatible with some degree of obstruction. A nonobstructing 8 mm calculus within the right renal pelvis identified. Small nonobstructing bilateral renal calculi are again noted. Bilateral renal cysts are present. Stomach/Bowel: Colonic diverticulosis is identified without evidence of diverticulitis. No dilated bowel loops are noted. Vascular/Lymphatic: Aortic atherosclerotic calcifications noted without aneurysm. No enlarged lymph  nodes are noted. Other: No free fluid, focal collection or pneumoperitoneum. Musculoskeletal: No acute or suspicious abnormalities. IMPRESSION: 6 mm left renal pelvis calculus and 3 mm left UPJ calculus with mild left hydronephrosis. Bilateral nonobstructing renal calculi. Unchanged 1.3 x 1.5 cm cystic lesion within the body/ tail of the pancreas. Although this most likely represents indolent cystic lesion, MR or CT follow-up in 1 year, given that this was not apparent on the 2009 studies. Aortic atherosclerosis without aneurysm. Electronically Signed   By: JMargarette CanadaM.D.   On: 08/11/2015 17:46    Microbiology: No results found for this or any previous visit (from the past 240 hour(s)).   Labs: Basic Metabolic Panel:  Recent Labs Lab 08/11/15 0912 08/12/15 0752  NA 138 138  K 3.7 3.7  CL 102 103  CO2 26 26  GLUCOSE 230* 271*  BUN 9 7  CREATININE 0.83 0.84  CALCIUM 9.4 8.4*   Liver Function Tests:  Recent Labs Lab 08/11/15 0924 08/12/15 0752  AST 18 17  ALT 16* 13*  ALKPHOS 40 33*  BILITOT 0.6 0.7  PROT 6.1* 5.1*  ALBUMIN 3.4* 2.9*    Recent Labs Lab 08/11/15 0924 08/12/15 0752  LIPASE 1650* 219*   No results for input(s): AMMONIA in the last 168 hours. CBC:  Recent Labs Lab 08/11/15 0912 08/12/15 0752  WBC 6.6 5.9  HGB 12.9* 11.9*  HCT 37.9* 34.5*  MCV 85.2 85.4  PLT 163 143*   Cardiac Enzymes: No results for input(s): CKTOTAL, CKMB, CKMBINDEX, TROPONINI in the last 168 hours. BNP: BNP (last 3 results) No results for input(s): BNP in the last 8760 hours.  ProBNP (last 3 results) No results for input(s): PROBNP in the last 8760 hours.  CBG:  Recent Labs Lab 08/11/15 1624 08/11/15 2126 08/12/15 0636 08/12/15 1224  GLUCAP 131* 213* 167* 162*    Time coordinating discharge: 35 minutes  Signed:  Allex Madia  [facility] 08/12/2015, 2:25 PM

## 2015-08-12 NOTE — Care Management Obs Status (Signed)
[place] NOTIFICATION   Patient Details  Name: Samuel Ramos MRN: [id number] Date of Birth: [birth year]   Medicare Observation Status Notification Given:  Yes    Nila Nephew, RN 08/12/2015, 3:04 PM

## 2015-08-12 NOTE — Progress Notes (Signed)
   08/12/15 1417  Clinical Encounter Type  Visited With Patient and family together  Visit Type Initial;Spiritual support  Referral From Patient   Unit chaplain confirmed that Samuel Ramos has visited and offered support. Chaplain support available as needed.   Jeri Lager, Chaplain 08/12/2015 2:19 PM

## 2015-08-13 LAB — HEMOGLOBIN A1C
HEMOGLOBIN A1C: 9.9 % — AB (ref 4.8–5.6)
Mean Plasma Glucose: 237 mg/dL

## 2015-08-13 LAB — URINE CULTURE: Culture: 3000

## 2015-08-15 DIAGNOSIS — E119 Type 2 diabetes mellitus without complications: Secondary | ICD-10-CM | POA: Diagnosis not present

## 2015-08-15 DIAGNOSIS — K8500 Idiopathic acute pancreatitis without necrosis or infection: Secondary | ICD-10-CM | POA: Diagnosis not present

## 2015-08-15 DIAGNOSIS — Z7984 Long term (current) use of oral hypoglycemic drugs: Secondary | ICD-10-CM | POA: Diagnosis not present

## 2015-10-18 DIAGNOSIS — I10 Essential (primary) hypertension: Secondary | ICD-10-CM | POA: Diagnosis not present

## 2015-10-18 DIAGNOSIS — Z7984 Long term (current) use of oral hypoglycemic drugs: Secondary | ICD-10-CM | POA: Diagnosis not present

## 2015-10-18 DIAGNOSIS — Z23 Encounter for immunization: Secondary | ICD-10-CM | POA: Diagnosis not present

## 2015-10-18 DIAGNOSIS — E7800 Pure hypercholesterolemia, unspecified: Secondary | ICD-10-CM | POA: Diagnosis not present

## 2015-10-18 DIAGNOSIS — Z79899 Other long term (current) drug therapy: Secondary | ICD-10-CM | POA: Diagnosis not present

## 2015-10-18 DIAGNOSIS — E119 Type 2 diabetes mellitus without complications: Secondary | ICD-10-CM | POA: Diagnosis not present

## 2015-12-06 DIAGNOSIS — R208 Other disturbances of skin sensation: Secondary | ICD-10-CM | POA: Diagnosis not present

## 2015-12-06 DIAGNOSIS — D485 Neoplasm of uncertain behavior of skin: Secondary | ICD-10-CM | POA: Diagnosis not present

## 2015-12-06 DIAGNOSIS — Z85828 Personal history of other malignant neoplasm of skin: Secondary | ICD-10-CM | POA: Diagnosis not present

## 2015-12-06 DIAGNOSIS — D225 Melanocytic nevi of trunk: Secondary | ICD-10-CM | POA: Diagnosis not present

## 2015-12-06 DIAGNOSIS — D0339 Melanoma in situ of other parts of face: Secondary | ICD-10-CM | POA: Diagnosis not present

## 2015-12-06 DIAGNOSIS — D2272 Melanocytic nevi of left lower limb, including hip: Secondary | ICD-10-CM | POA: Diagnosis not present

## 2015-12-06 DIAGNOSIS — D2261 Melanocytic nevi of right upper limb, including shoulder: Secondary | ICD-10-CM | POA: Diagnosis not present

## 2015-12-06 DIAGNOSIS — D2262 Melanocytic nevi of left upper limb, including shoulder: Secondary | ICD-10-CM | POA: Diagnosis not present

## 2015-12-06 DIAGNOSIS — L821 Other seborrheic keratosis: Secondary | ICD-10-CM | POA: Diagnosis not present

## 2015-12-06 DIAGNOSIS — D1801 Hemangioma of skin and subcutaneous tissue: Secondary | ICD-10-CM | POA: Diagnosis not present

## 2015-12-06 DIAGNOSIS — D2271 Melanocytic nevi of right lower limb, including hip: Secondary | ICD-10-CM | POA: Diagnosis not present

## 2015-12-16 ENCOUNTER — Other Ambulatory Visit: Payer: Self-pay | Admitting: Nurse Practitioner

## 2015-12-16 ENCOUNTER — Ambulatory Visit
Admission: RE | Admit: 2015-12-16 | Discharge: 2015-12-16 | Disposition: A | Discharge status: Home / Self Care | Payer: Medicare Other | Source: Ambulatory Visit | Attending: Nurse Practitioner | Admitting: Nurse Practitioner

## 2015-12-16 DIAGNOSIS — R5383 Other fatigue: Secondary | ICD-10-CM | POA: Diagnosis not present

## 2015-12-16 DIAGNOSIS — R05 Cough: Secondary | ICD-10-CM

## 2015-12-16 DIAGNOSIS — R0602 Shortness of breath: Secondary | ICD-10-CM | POA: Diagnosis not present

## 2015-12-16 DIAGNOSIS — R059 Cough, unspecified: Secondary | ICD-10-CM

## 2015-12-17 DIAGNOSIS — D2230 Melanocytic nevi of unspecified part of face: Secondary | ICD-10-CM | POA: Diagnosis not present

## 2015-12-17 DIAGNOSIS — L988 Other specified disorders of the skin and subcutaneous tissue: Secondary | ICD-10-CM | POA: Diagnosis not present

## 2015-12-17 DIAGNOSIS — D0339 Melanoma in situ of other parts of face: Secondary | ICD-10-CM | POA: Diagnosis not present

## 2015-12-17 DIAGNOSIS — Z85828 Personal history of other malignant neoplasm of skin: Secondary | ICD-10-CM | POA: Diagnosis not present

## 2016-01-02 DIAGNOSIS — E119 Type 2 diabetes mellitus without complications: Secondary | ICD-10-CM | POA: Diagnosis not present

## 2016-01-02 DIAGNOSIS — D2312 Other benign neoplasm of skin of left eyelid, including canthus: Secondary | ICD-10-CM | POA: Diagnosis not present

## 2016-01-02 DIAGNOSIS — H2513 Age-related nuclear cataract, bilateral: Secondary | ICD-10-CM | POA: Diagnosis not present

## 2016-01-02 DIAGNOSIS — H40013 Open angle with borderline findings, low risk, bilateral: Secondary | ICD-10-CM | POA: Diagnosis not present

## 2016-01-07 DIAGNOSIS — D485 Neoplasm of uncertain behavior of skin: Secondary | ICD-10-CM | POA: Diagnosis not present

## 2016-01-07 DIAGNOSIS — Z85828 Personal history of other malignant neoplasm of skin: Secondary | ICD-10-CM | POA: Diagnosis not present

## 2016-01-07 DIAGNOSIS — D2230 Melanocytic nevi of unspecified part of face: Secondary | ICD-10-CM | POA: Diagnosis not present

## 2016-01-07 DIAGNOSIS — L988 Other specified disorders of the skin and subcutaneous tissue: Secondary | ICD-10-CM | POA: Diagnosis not present

## 2016-01-07 DIAGNOSIS — D0339 Melanoma in situ of other parts of face: Secondary | ICD-10-CM | POA: Diagnosis not present

## 2016-01-22 DIAGNOSIS — E1165 Type 2 diabetes mellitus with hyperglycemia: Secondary | ICD-10-CM | POA: Diagnosis not present

## 2016-01-22 DIAGNOSIS — Z7984 Long term (current) use of oral hypoglycemic drugs: Secondary | ICD-10-CM | POA: Diagnosis not present

## 2016-05-11 DIAGNOSIS — Z79899 Other long term (current) drug therapy: Secondary | ICD-10-CM | POA: Diagnosis not present

## 2016-05-11 DIAGNOSIS — Z1389 Encounter for screening for other disorder: Secondary | ICD-10-CM | POA: Diagnosis not present

## 2016-05-11 DIAGNOSIS — I10 Essential (primary) hypertension: Secondary | ICD-10-CM | POA: Diagnosis not present

## 2016-05-11 DIAGNOSIS — Z7984 Long term (current) use of oral hypoglycemic drugs: Secondary | ICD-10-CM | POA: Diagnosis not present

## 2016-05-11 DIAGNOSIS — Z0000 Encounter for general adult medical examination without abnormal findings: Secondary | ICD-10-CM | POA: Diagnosis not present

## 2016-05-11 DIAGNOSIS — K5901 Slow transit constipation: Secondary | ICD-10-CM | POA: Diagnosis not present

## 2016-05-11 DIAGNOSIS — E1165 Type 2 diabetes mellitus with hyperglycemia: Secondary | ICD-10-CM | POA: Diagnosis not present

## 2016-05-11 DIAGNOSIS — H6122 Impacted cerumen, left ear: Secondary | ICD-10-CM | POA: Diagnosis not present

## 2016-05-11 DIAGNOSIS — E7800 Pure hypercholesterolemia, unspecified: Secondary | ICD-10-CM | POA: Diagnosis not present

## 2016-06-01 DIAGNOSIS — Z7984 Long term (current) use of oral hypoglycemic drugs: Secondary | ICD-10-CM | POA: Diagnosis not present

## 2016-06-01 DIAGNOSIS — I10 Essential (primary) hypertension: Secondary | ICD-10-CM | POA: Diagnosis not present

## 2016-06-01 DIAGNOSIS — E119 Type 2 diabetes mellitus without complications: Secondary | ICD-10-CM | POA: Diagnosis not present

## 2016-06-22 DIAGNOSIS — D485 Neoplasm of uncertain behavior of skin: Secondary | ICD-10-CM | POA: Diagnosis not present

## 2016-06-22 DIAGNOSIS — Z85820 Personal history of malignant melanoma of skin: Secondary | ICD-10-CM | POA: Diagnosis not present

## 2016-06-22 DIAGNOSIS — D225 Melanocytic nevi of trunk: Secondary | ICD-10-CM | POA: Diagnosis not present

## 2016-06-22 DIAGNOSIS — L814 Other melanin hyperpigmentation: Secondary | ICD-10-CM | POA: Diagnosis not present

## 2016-06-22 DIAGNOSIS — D2261 Melanocytic nevi of right upper limb, including shoulder: Secondary | ICD-10-CM | POA: Diagnosis not present

## 2016-06-22 DIAGNOSIS — L821 Other seborrheic keratosis: Secondary | ICD-10-CM | POA: Diagnosis not present

## 2016-06-22 DIAGNOSIS — D2262 Melanocytic nevi of left upper limb, including shoulder: Secondary | ICD-10-CM | POA: Diagnosis not present

## 2016-06-22 DIAGNOSIS — Z85828 Personal history of other malignant neoplasm of skin: Secondary | ICD-10-CM | POA: Diagnosis not present

## 2016-06-22 DIAGNOSIS — D1801 Hemangioma of skin and subcutaneous tissue: Secondary | ICD-10-CM | POA: Diagnosis not present

## 2016-06-22 DIAGNOSIS — D2271 Melanocytic nevi of right lower limb, including hip: Secondary | ICD-10-CM | POA: Diagnosis not present

## 2016-06-22 DIAGNOSIS — D2272 Melanocytic nevi of left lower limb, including hip: Secondary | ICD-10-CM | POA: Diagnosis not present

## 2016-07-22 DIAGNOSIS — I10 Essential (primary) hypertension: Secondary | ICD-10-CM | POA: Diagnosis not present

## 2016-07-22 DIAGNOSIS — E1129 Type 2 diabetes mellitus with other diabetic kidney complication: Secondary | ICD-10-CM | POA: Diagnosis not present

## 2016-07-22 DIAGNOSIS — R809 Proteinuria, unspecified: Secondary | ICD-10-CM | POA: Diagnosis not present

## 2016-07-22 DIAGNOSIS — E1165 Type 2 diabetes mellitus with hyperglycemia: Secondary | ICD-10-CM | POA: Diagnosis not present

## 2016-07-22 DIAGNOSIS — Z7984 Long term (current) use of oral hypoglycemic drugs: Secondary | ICD-10-CM | POA: Diagnosis not present

## 2016-07-22 DIAGNOSIS — Z79899 Other long term (current) drug therapy: Secondary | ICD-10-CM | POA: Diagnosis not present

## 2016-08-18 ENCOUNTER — Encounter: Payer: Medicare Other | Attending: Geriatric Medicine | Admitting: Nutrition

## 2016-08-18 DIAGNOSIS — E119 Type 2 diabetes mellitus without complications: Secondary | ICD-10-CM | POA: Diagnosis not present

## 2016-08-18 DIAGNOSIS — Z713 Dietary counseling and surveillance: Secondary | ICD-10-CM | POA: Diagnosis not present

## 2016-08-18 NOTE — Progress Notes (Signed)
Samuel Ramos and his wife were instructed on how to inject Toujeo insulin. We discussed how this insulin works, why he needs this insulin, and how to draw up and inject the insulin.  He re demonstrated correctly how to attach the needle, and dial in 8u.  He did not inject due to timing.   We also discussed low blood sugar--symptoms and treatment.  He reported good understanding of this and had no final questions. We also discussed the importance of testing blood sugar to determine if the insulin dose is the correct amount.  He was instructed to test acB and the goal for treatment is less than 140.  He was instructed to call if, after 1 week, the FBSs remain over 150.  He was also advised to test once a day 2 hours after one meal.  The goal for this reading is less than 180.  He reported good understanding of this.   He was give a BD pen starter kit with glucose tablets, brochures on low blood sugar, pen use and general diabetes diet information. He was also given a brochure on Toujeo insulin with written instructions for 8u to be given at bedtime.   

## 2016-08-18 NOTE — Patient Instructions (Signed)
Inject 8u of Toujeo insulin every evening Test blood sugar at least once a day-before breakfast.

## 2016-08-26 DIAGNOSIS — N401 Enlarged prostate with lower urinary tract symptoms: Secondary | ICD-10-CM | POA: Diagnosis not present

## 2016-08-26 DIAGNOSIS — R351 Nocturia: Secondary | ICD-10-CM | POA: Diagnosis not present

## 2016-08-26 DIAGNOSIS — N200 Calculus of kidney: Secondary | ICD-10-CM | POA: Diagnosis not present

## 2016-08-26 DIAGNOSIS — N5201 Erectile dysfunction due to arterial insufficiency: Secondary | ICD-10-CM | POA: Diagnosis not present

## 2016-08-26 DIAGNOSIS — E291 Testicular hypofunction: Secondary | ICD-10-CM | POA: Diagnosis not present

## 2016-09-10 DIAGNOSIS — L738 Other specified follicular disorders: Secondary | ICD-10-CM | POA: Diagnosis not present

## 2016-09-10 DIAGNOSIS — Z85828 Personal history of other malignant neoplasm of skin: Secondary | ICD-10-CM | POA: Diagnosis not present

## 2016-11-09 DIAGNOSIS — Z794 Long term (current) use of insulin: Secondary | ICD-10-CM | POA: Diagnosis not present

## 2016-11-09 DIAGNOSIS — E1129 Type 2 diabetes mellitus with other diabetic kidney complication: Secondary | ICD-10-CM | POA: Diagnosis not present

## 2016-11-09 DIAGNOSIS — E1165 Type 2 diabetes mellitus with hyperglycemia: Secondary | ICD-10-CM | POA: Diagnosis not present

## 2016-11-09 DIAGNOSIS — Z79899 Other long term (current) drug therapy: Secondary | ICD-10-CM | POA: Diagnosis not present

## 2016-11-09 DIAGNOSIS — Z7984 Long term (current) use of oral hypoglycemic drugs: Secondary | ICD-10-CM | POA: Diagnosis not present

## 2016-11-09 DIAGNOSIS — I10 Essential (primary) hypertension: Secondary | ICD-10-CM | POA: Diagnosis not present

## 2016-11-09 DIAGNOSIS — E7800 Pure hypercholesterolemia, unspecified: Secondary | ICD-10-CM | POA: Diagnosis not present

## 2016-11-09 DIAGNOSIS — R800 Isolated proteinuria: Secondary | ICD-10-CM | POA: Diagnosis not present

## 2016-12-11 DIAGNOSIS — D2262 Melanocytic nevi of left upper limb, including shoulder: Secondary | ICD-10-CM | POA: Diagnosis not present

## 2016-12-11 DIAGNOSIS — D2261 Melanocytic nevi of right upper limb, including shoulder: Secondary | ICD-10-CM | POA: Diagnosis not present

## 2016-12-11 DIAGNOSIS — R208 Other disturbances of skin sensation: Secondary | ICD-10-CM | POA: Diagnosis not present

## 2016-12-11 DIAGNOSIS — D1801 Hemangioma of skin and subcutaneous tissue: Secondary | ICD-10-CM | POA: Diagnosis not present

## 2016-12-11 DIAGNOSIS — L821 Other seborrheic keratosis: Secondary | ICD-10-CM | POA: Diagnosis not present

## 2016-12-11 DIAGNOSIS — D225 Melanocytic nevi of trunk: Secondary | ICD-10-CM | POA: Diagnosis not present

## 2016-12-11 DIAGNOSIS — D485 Neoplasm of uncertain behavior of skin: Secondary | ICD-10-CM | POA: Diagnosis not present

## 2016-12-11 DIAGNOSIS — Z85828 Personal history of other malignant neoplasm of skin: Secondary | ICD-10-CM | POA: Diagnosis not present

## 2016-12-11 DIAGNOSIS — D2271 Melanocytic nevi of right lower limb, including hip: Secondary | ICD-10-CM | POA: Diagnosis not present

## 2016-12-11 DIAGNOSIS — D2272 Melanocytic nevi of left lower limb, including hip: Secondary | ICD-10-CM | POA: Diagnosis not present

## 2016-12-11 DIAGNOSIS — C44519 Basal cell carcinoma of skin of other part of trunk: Secondary | ICD-10-CM | POA: Diagnosis not present

## 2017-01-05 DIAGNOSIS — E119 Type 2 diabetes mellitus without complications: Secondary | ICD-10-CM | POA: Diagnosis not present

## 2017-01-05 DIAGNOSIS — H40013 Open angle with borderline findings, low risk, bilateral: Secondary | ICD-10-CM | POA: Diagnosis not present

## 2017-01-05 DIAGNOSIS — H2513 Age-related nuclear cataract, bilateral: Secondary | ICD-10-CM | POA: Diagnosis not present

## 2017-02-09 DIAGNOSIS — N400 Enlarged prostate without lower urinary tract symptoms: Secondary | ICD-10-CM | POA: Diagnosis not present

## 2017-02-09 DIAGNOSIS — I10 Essential (primary) hypertension: Secondary | ICD-10-CM | POA: Diagnosis not present

## 2017-02-09 DIAGNOSIS — E1129 Type 2 diabetes mellitus with other diabetic kidney complication: Secondary | ICD-10-CM | POA: Diagnosis not present

## 2017-02-09 DIAGNOSIS — E1165 Type 2 diabetes mellitus with hyperglycemia: Secondary | ICD-10-CM | POA: Diagnosis not present

## 2017-02-09 DIAGNOSIS — Z794 Long term (current) use of insulin: Secondary | ICD-10-CM | POA: Diagnosis not present

## 2017-03-10 DIAGNOSIS — I10 Essential (primary) hypertension: Secondary | ICD-10-CM | POA: Diagnosis not present

## 2017-03-10 DIAGNOSIS — N400 Enlarged prostate without lower urinary tract symptoms: Secondary | ICD-10-CM | POA: Diagnosis not present

## 2017-03-10 DIAGNOSIS — E1165 Type 2 diabetes mellitus with hyperglycemia: Secondary | ICD-10-CM | POA: Diagnosis not present

## 2017-03-10 DIAGNOSIS — E1129 Type 2 diabetes mellitus with other diabetic kidney complication: Secondary | ICD-10-CM | POA: Diagnosis not present

## 2017-03-29 DIAGNOSIS — I10 Essential (primary) hypertension: Secondary | ICD-10-CM | POA: Diagnosis not present

## 2017-03-29 DIAGNOSIS — E1165 Type 2 diabetes mellitus with hyperglycemia: Secondary | ICD-10-CM | POA: Diagnosis not present

## 2017-03-29 DIAGNOSIS — N400 Enlarged prostate without lower urinary tract symptoms: Secondary | ICD-10-CM | POA: Diagnosis not present

## 2017-03-29 DIAGNOSIS — E1129 Type 2 diabetes mellitus with other diabetic kidney complication: Secondary | ICD-10-CM | POA: Diagnosis not present

## 2017-03-29 DIAGNOSIS — Z7984 Long term (current) use of oral hypoglycemic drugs: Secondary | ICD-10-CM | POA: Diagnosis not present

## 2017-05-14 DIAGNOSIS — Z23 Encounter for immunization: Secondary | ICD-10-CM | POA: Diagnosis not present

## 2017-05-31 DIAGNOSIS — Z0000 Encounter for general adult medical examination without abnormal findings: Secondary | ICD-10-CM | POA: Diagnosis not present

## 2017-05-31 DIAGNOSIS — Z1389 Encounter for screening for other disorder: Secondary | ICD-10-CM | POA: Diagnosis not present

## 2017-05-31 DIAGNOSIS — Z125 Encounter for screening for malignant neoplasm of prostate: Secondary | ICD-10-CM | POA: Diagnosis not present

## 2017-05-31 DIAGNOSIS — Z79899 Other long term (current) drug therapy: Secondary | ICD-10-CM | POA: Diagnosis not present

## 2017-05-31 DIAGNOSIS — E1165 Type 2 diabetes mellitus with hyperglycemia: Secondary | ICD-10-CM | POA: Diagnosis not present

## 2017-05-31 DIAGNOSIS — E1121 Type 2 diabetes mellitus with diabetic nephropathy: Secondary | ICD-10-CM | POA: Diagnosis not present

## 2017-05-31 DIAGNOSIS — I10 Essential (primary) hypertension: Secondary | ICD-10-CM | POA: Diagnosis not present

## 2017-05-31 DIAGNOSIS — Z794 Long term (current) use of insulin: Secondary | ICD-10-CM | POA: Diagnosis not present

## 2017-05-31 DIAGNOSIS — Z7984 Long term (current) use of oral hypoglycemic drugs: Secondary | ICD-10-CM | POA: Diagnosis not present

## 2017-05-31 DIAGNOSIS — R252 Cramp and spasm: Secondary | ICD-10-CM | POA: Diagnosis not present

## 2017-05-31 DIAGNOSIS — E7800 Pure hypercholesterolemia, unspecified: Secondary | ICD-10-CM | POA: Diagnosis not present

## 2017-07-07 DIAGNOSIS — C44519 Basal cell carcinoma of skin of other part of trunk: Secondary | ICD-10-CM | POA: Diagnosis not present

## 2017-07-07 DIAGNOSIS — L918 Other hypertrophic disorders of the skin: Secondary | ICD-10-CM | POA: Diagnosis not present

## 2017-07-07 DIAGNOSIS — D2272 Melanocytic nevi of left lower limb, including hip: Secondary | ICD-10-CM | POA: Diagnosis not present

## 2017-07-07 DIAGNOSIS — D225 Melanocytic nevi of trunk: Secondary | ICD-10-CM | POA: Diagnosis not present

## 2017-07-07 DIAGNOSIS — Z85828 Personal history of other malignant neoplasm of skin: Secondary | ICD-10-CM | POA: Diagnosis not present

## 2017-07-07 DIAGNOSIS — L814 Other melanin hyperpigmentation: Secondary | ICD-10-CM | POA: Diagnosis not present

## 2017-07-07 DIAGNOSIS — D1801 Hemangioma of skin and subcutaneous tissue: Secondary | ICD-10-CM | POA: Diagnosis not present

## 2017-07-07 DIAGNOSIS — L821 Other seborrheic keratosis: Secondary | ICD-10-CM | POA: Diagnosis not present

## 2017-09-06 ENCOUNTER — Ambulatory Visit
Admission: RE | Admit: 2017-09-06 | Discharge: 2017-09-06 | Disposition: A | Discharge status: Home / Self Care | Payer: Medicare Other | Source: Ambulatory Visit | Attending: Geriatric Medicine | Admitting: Geriatric Medicine

## 2017-09-06 ENCOUNTER — Other Ambulatory Visit: Payer: Self-pay | Admitting: Geriatric Medicine

## 2017-09-06 DIAGNOSIS — J209 Acute bronchitis, unspecified: Secondary | ICD-10-CM | POA: Diagnosis not present

## 2017-09-06 DIAGNOSIS — R05 Cough: Secondary | ICD-10-CM | POA: Diagnosis not present

## 2017-12-10 ENCOUNTER — Other Ambulatory Visit: Payer: Self-pay | Admitting: Geriatric Medicine

## 2017-12-10 ENCOUNTER — Ambulatory Visit
Admission: RE | Admit: 2017-12-10 | Discharge: 2017-12-10 | Disposition: A | Discharge status: Home / Self Care | Payer: Medicare Other | Source: Ambulatory Visit | Attending: Geriatric Medicine | Admitting: Geriatric Medicine

## 2017-12-10 DIAGNOSIS — M545 Low back pain, unspecified: Secondary | ICD-10-CM

## 2017-12-10 DIAGNOSIS — E1121 Type 2 diabetes mellitus with diabetic nephropathy: Secondary | ICD-10-CM | POA: Diagnosis not present

## 2017-12-10 DIAGNOSIS — I10 Essential (primary) hypertension: Secondary | ICD-10-CM | POA: Diagnosis not present

## 2017-12-10 DIAGNOSIS — M5136 Other intervertebral disc degeneration, lumbar region: Secondary | ICD-10-CM | POA: Diagnosis not present

## 2017-12-10 DIAGNOSIS — M25552 Pain in left hip: Secondary | ICD-10-CM

## 2017-12-10 DIAGNOSIS — Z7984 Long term (current) use of oral hypoglycemic drugs: Secondary | ICD-10-CM | POA: Diagnosis not present

## 2017-12-10 DIAGNOSIS — M1612 Unilateral primary osteoarthritis, left hip: Secondary | ICD-10-CM | POA: Diagnosis not present

## 2017-12-16 DIAGNOSIS — M9905 Segmental and somatic dysfunction of pelvic region: Secondary | ICD-10-CM | POA: Diagnosis not present

## 2017-12-16 DIAGNOSIS — Q72812 Congenital shortening of left lower limb: Secondary | ICD-10-CM | POA: Diagnosis not present

## 2017-12-16 DIAGNOSIS — M9903 Segmental and somatic dysfunction of lumbar region: Secondary | ICD-10-CM | POA: Diagnosis not present

## 2017-12-16 DIAGNOSIS — M5441 Lumbago with sciatica, right side: Secondary | ICD-10-CM | POA: Diagnosis not present

## 2017-12-17 DIAGNOSIS — M9905 Segmental and somatic dysfunction of pelvic region: Secondary | ICD-10-CM | POA: Diagnosis not present

## 2017-12-17 DIAGNOSIS — M5441 Lumbago with sciatica, right side: Secondary | ICD-10-CM | POA: Diagnosis not present

## 2017-12-17 DIAGNOSIS — Q72812 Congenital shortening of left lower limb: Secondary | ICD-10-CM | POA: Diagnosis not present

## 2017-12-17 DIAGNOSIS — M9903 Segmental and somatic dysfunction of lumbar region: Secondary | ICD-10-CM | POA: Diagnosis not present

## 2017-12-21 DIAGNOSIS — M5441 Lumbago with sciatica, right side: Secondary | ICD-10-CM | POA: Diagnosis not present

## 2017-12-21 DIAGNOSIS — M9905 Segmental and somatic dysfunction of pelvic region: Secondary | ICD-10-CM | POA: Diagnosis not present

## 2017-12-21 DIAGNOSIS — M9903 Segmental and somatic dysfunction of lumbar region: Secondary | ICD-10-CM | POA: Diagnosis not present

## 2017-12-21 DIAGNOSIS — Q72812 Congenital shortening of left lower limb: Secondary | ICD-10-CM | POA: Diagnosis not present

## 2017-12-23 DIAGNOSIS — Q72812 Congenital shortening of left lower limb: Secondary | ICD-10-CM | POA: Diagnosis not present

## 2017-12-23 DIAGNOSIS — M9905 Segmental and somatic dysfunction of pelvic region: Secondary | ICD-10-CM | POA: Diagnosis not present

## 2017-12-23 DIAGNOSIS — M5441 Lumbago with sciatica, right side: Secondary | ICD-10-CM | POA: Diagnosis not present

## 2017-12-23 DIAGNOSIS — M9903 Segmental and somatic dysfunction of lumbar region: Secondary | ICD-10-CM | POA: Diagnosis not present

## 2017-12-29 DIAGNOSIS — M9903 Segmental and somatic dysfunction of lumbar region: Secondary | ICD-10-CM | POA: Diagnosis not present

## 2017-12-29 DIAGNOSIS — Q72812 Congenital shortening of left lower limb: Secondary | ICD-10-CM | POA: Diagnosis not present

## 2017-12-29 DIAGNOSIS — M9905 Segmental and somatic dysfunction of pelvic region: Secondary | ICD-10-CM | POA: Diagnosis not present

## 2017-12-29 DIAGNOSIS — M5441 Lumbago with sciatica, right side: Secondary | ICD-10-CM | POA: Diagnosis not present

## 2017-12-30 DIAGNOSIS — Q72812 Congenital shortening of left lower limb: Secondary | ICD-10-CM | POA: Diagnosis not present

## 2017-12-30 DIAGNOSIS — M9905 Segmental and somatic dysfunction of pelvic region: Secondary | ICD-10-CM | POA: Diagnosis not present

## 2017-12-30 DIAGNOSIS — M5441 Lumbago with sciatica, right side: Secondary | ICD-10-CM | POA: Diagnosis not present

## 2017-12-30 DIAGNOSIS — M9903 Segmental and somatic dysfunction of lumbar region: Secondary | ICD-10-CM | POA: Diagnosis not present

## 2018-01-03 DIAGNOSIS — M9905 Segmental and somatic dysfunction of pelvic region: Secondary | ICD-10-CM | POA: Diagnosis not present

## 2018-01-03 DIAGNOSIS — M5441 Lumbago with sciatica, right side: Secondary | ICD-10-CM | POA: Diagnosis not present

## 2018-01-03 DIAGNOSIS — M9903 Segmental and somatic dysfunction of lumbar region: Secondary | ICD-10-CM | POA: Diagnosis not present

## 2018-01-03 DIAGNOSIS — Q72812 Congenital shortening of left lower limb: Secondary | ICD-10-CM | POA: Diagnosis not present

## 2018-01-04 DIAGNOSIS — M9903 Segmental and somatic dysfunction of lumbar region: Secondary | ICD-10-CM | POA: Diagnosis not present

## 2018-01-04 DIAGNOSIS — Q72812 Congenital shortening of left lower limb: Secondary | ICD-10-CM | POA: Diagnosis not present

## 2018-01-04 DIAGNOSIS — M5441 Lumbago with sciatica, right side: Secondary | ICD-10-CM | POA: Diagnosis not present

## 2018-01-04 DIAGNOSIS — M9905 Segmental and somatic dysfunction of pelvic region: Secondary | ICD-10-CM | POA: Diagnosis not present

## 2018-01-05 DIAGNOSIS — D2372 Other benign neoplasm of skin of left lower limb, including hip: Secondary | ICD-10-CM | POA: Diagnosis not present

## 2018-01-05 DIAGNOSIS — S80862A Insect bite (nonvenomous), left lower leg, initial encounter: Secondary | ICD-10-CM | POA: Diagnosis not present

## 2018-01-05 DIAGNOSIS — D1801 Hemangioma of skin and subcutaneous tissue: Secondary | ICD-10-CM | POA: Diagnosis not present

## 2018-01-05 DIAGNOSIS — D2262 Melanocytic nevi of left upper limb, including shoulder: Secondary | ICD-10-CM | POA: Diagnosis not present

## 2018-01-05 DIAGNOSIS — D225 Melanocytic nevi of trunk: Secondary | ICD-10-CM | POA: Diagnosis not present

## 2018-01-05 DIAGNOSIS — Z85828 Personal history of other malignant neoplasm of skin: Secondary | ICD-10-CM | POA: Diagnosis not present

## 2018-01-05 DIAGNOSIS — D2261 Melanocytic nevi of right upper limb, including shoulder: Secondary | ICD-10-CM | POA: Diagnosis not present

## 2018-01-05 DIAGNOSIS — L814 Other melanin hyperpigmentation: Secondary | ICD-10-CM | POA: Diagnosis not present

## 2018-01-05 DIAGNOSIS — C44519 Basal cell carcinoma of skin of other part of trunk: Secondary | ICD-10-CM | POA: Diagnosis not present

## 2018-01-05 DIAGNOSIS — L570 Actinic keratosis: Secondary | ICD-10-CM | POA: Diagnosis not present

## 2018-01-05 DIAGNOSIS — L821 Other seborrheic keratosis: Secondary | ICD-10-CM | POA: Diagnosis not present

## 2018-01-05 DIAGNOSIS — D2272 Melanocytic nevi of left lower limb, including hip: Secondary | ICD-10-CM | POA: Diagnosis not present

## 2018-01-06 DIAGNOSIS — Q72812 Congenital shortening of left lower limb: Secondary | ICD-10-CM | POA: Diagnosis not present

## 2018-01-06 DIAGNOSIS — M5441 Lumbago with sciatica, right side: Secondary | ICD-10-CM | POA: Diagnosis not present

## 2018-01-06 DIAGNOSIS — M9905 Segmental and somatic dysfunction of pelvic region: Secondary | ICD-10-CM | POA: Diagnosis not present

## 2018-01-06 DIAGNOSIS — M9903 Segmental and somatic dysfunction of lumbar region: Secondary | ICD-10-CM | POA: Diagnosis not present

## 2018-01-10 DIAGNOSIS — M5441 Lumbago with sciatica, right side: Secondary | ICD-10-CM | POA: Diagnosis not present

## 2018-01-10 DIAGNOSIS — Q72812 Congenital shortening of left lower limb: Secondary | ICD-10-CM | POA: Diagnosis not present

## 2018-01-10 DIAGNOSIS — M9905 Segmental and somatic dysfunction of pelvic region: Secondary | ICD-10-CM | POA: Diagnosis not present

## 2018-01-10 DIAGNOSIS — M9903 Segmental and somatic dysfunction of lumbar region: Secondary | ICD-10-CM | POA: Diagnosis not present

## 2018-01-12 DIAGNOSIS — H40013 Open angle with borderline findings, low risk, bilateral: Secondary | ICD-10-CM | POA: Diagnosis not present

## 2018-01-12 DIAGNOSIS — H2513 Age-related nuclear cataract, bilateral: Secondary | ICD-10-CM | POA: Diagnosis not present

## 2018-01-12 DIAGNOSIS — E119 Type 2 diabetes mellitus without complications: Secondary | ICD-10-CM | POA: Diagnosis not present

## 2018-01-17 DIAGNOSIS — M5441 Lumbago with sciatica, right side: Secondary | ICD-10-CM | POA: Diagnosis not present

## 2018-01-17 DIAGNOSIS — M9905 Segmental and somatic dysfunction of pelvic region: Secondary | ICD-10-CM | POA: Diagnosis not present

## 2018-01-17 DIAGNOSIS — Q72812 Congenital shortening of left lower limb: Secondary | ICD-10-CM | POA: Diagnosis not present

## 2018-01-17 DIAGNOSIS — M9903 Segmental and somatic dysfunction of lumbar region: Secondary | ICD-10-CM | POA: Diagnosis not present

## 2018-01-18 DIAGNOSIS — M9905 Segmental and somatic dysfunction of pelvic region: Secondary | ICD-10-CM | POA: Diagnosis not present

## 2018-01-18 DIAGNOSIS — M9903 Segmental and somatic dysfunction of lumbar region: Secondary | ICD-10-CM | POA: Diagnosis not present

## 2018-01-18 DIAGNOSIS — Q72812 Congenital shortening of left lower limb: Secondary | ICD-10-CM | POA: Diagnosis not present

## 2018-01-18 DIAGNOSIS — M5441 Lumbago with sciatica, right side: Secondary | ICD-10-CM | POA: Diagnosis not present

## 2018-01-20 ENCOUNTER — Ambulatory Visit (INDEPENDENT_AMBULATORY_CARE_PROVIDER_SITE_OTHER): Payer: Medicare Other | Admitting: Family

## 2018-01-20 ENCOUNTER — Encounter (INDEPENDENT_AMBULATORY_CARE_PROVIDER_SITE_OTHER): Payer: Self-pay | Admitting: Family

## 2018-01-20 DIAGNOSIS — M5417 Radiculopathy, lumbosacral region: Secondary | ICD-10-CM

## 2018-01-20 MED ORDER — PREDNISONE 50 MG PO TABS: ORAL_TABLET | ORAL | 0 refills | Status: DC

## 2018-01-20 NOTE — Progress Notes (Signed)
Office Visit Note   Patient: Samuel Ramos           Date of Birth: 23-Jul-1940           MRN: [id number] Visit Date: 01/20/2018              Requested by: Lajean Manes, MD [address] [place], [place] [address] PCP: Lajean Manes, MD  Chief Complaint  Patient presents with  . Left Hip - Pain      HPI: The patient is a 77 year old gentleman seen today for evaluation of left hip pain after a fall about 6 months ago down some stairs. Points to left low back and buttock as most painful area. Pain radiates down buttock, occasionally in to lateral thigh. Shooting pain. No numbness or tingling. Feels must lift his leg at times due to pain, must lift leg to get out of bed.  Has had radiographs of hip which showed degenerative changes. Did also have film of lumbar spine at chiropractor. States chiropractor helped for first visit but lately seems to worsen his pain.   Pain inhibiting his ADLs.  Assessment & Plan: Visit Diagnoses:  1. Lumbosacral radiculopathy     Plan: prednisone burst. May need to consider MRI of lumbar spine for ESI. Follow up in office if no improvement with Pred.   Follow-Up Instructions: Return in about 3 weeks (around 02/10/2018), or if symptoms worsen or fail to improve.   Left Hip Exam  Left hip exam is normal.  Range of Motion  The patient has normal left hip ROM.  Muscle Strength  The patient has normal left hip strength.   Tests  FABER: negative   Back Exam   Tenderness  The patient is experiencing tenderness in the lumbar.  Range of Motion  The patient has normal back ROM.  Muscle Strength  The patient has normal back strength.  Tests  Straight leg raise right: negative Straight leg raise left: negative  Comments:  Pain with extension of lumbar spine      Patient is alert, oriented, no adenopathy, well-dressed, normal affect, normal respiratory effort.   Imaging: No results found. No images are attached to  the encounter.  Labs: Lab Results  Component Value Date   HGBA1C 9.9 (H) 08/12/2015   REPTSTATUS 08/13/2015 FINAL 08/12/2015   CULT 3,000 COLONIES/mL INSIGNIFICANT GROWTH 08/12/2015     Lab Results  Component Value Date   ALBUMIN 2.9 (L) 08/12/2015   ALBUMIN 3.4 (L) 08/11/2015    There is no height or weight on file to calculate BMI.  Orders:  No orders of the defined types were placed in this encounter.  Meds ordered this encounter  Medications  . predniSONE (DELTASONE) 50 MG tablet    Sig: Take one tablet once daily x 5 days    Dispense:  5 tablet    Refill:  0     Procedures: No procedures performed  Clinical Data: No additional findings.  ROS:  All other systems negative, except as noted in the HPI. Review of Systems  Constitutional: Negative for chills and fever.  Musculoskeletal: Positive for back pain.  Neurological: Positive for weakness. Negative for numbness.    Objective: Vital Signs: There were no vitals taken for this visit.  Specialty Comments:  No specialty comments available.  PMFS History: Patient Active Problem List   Diagnosis Date Noted  . Nephrolithiasis 08/12/2015  . Acute pancreatitis 08/11/2015  . Hypercholesterolemia 08/11/2015  . Iron  deficiency anemia 08/11/2015  . Diabetes mellitus without complication ([place])   . BPH (benign prostatic hypertrophy) 01/21/2015  . Dizziness and giddiness 10/17/2014   Past Medical History:  Diagnosis Date  . BPH (benign prostatic hyperplasia)   . Cancer ([place])    melanoma left arm and back removed  . Diabetes mellitus without complication ([place])   . Diverticulitis   . Pancreatitis    many years ago  . Renal stone     Family History  Problem Relation Age of Onset  . Melanoma Father   . Pancreatitis Neg Hx   . Lymphoma Sister   . Uterine cancer Mother     Past Surgical History:  Procedure Laterality Date  . PROSTATECTOMY N/A 01/21/2015   Procedure: PROSTATECTOMY RETROPUBIC;  Surgeon:  Carolan Clines, MD;  Location: [facility];  Service: Urology;  Laterality: N/A;  . right leg broken (tibia and fibula) 1961    . SHOULDER ARTHROSCOPY WITH OPEN ROTATOR CUFF REPAIR  2002   right  . TONSILLECTOMY    . WISDOM TOOTH EXTRACTION     Social History   Occupational History  . Not on file  Tobacco Use  . Smoking status: Never Smoker  . Smokeless tobacco: Never Used  Substance and Sexual Activity  . Alcohol use: No    Comment: 1 per week  . Drug use: No  . Sexual activity: Not on file

## 2018-01-25 ENCOUNTER — Telehealth (INDEPENDENT_AMBULATORY_CARE_PROVIDER_SITE_OTHER): Payer: Self-pay | Admitting: Family

## 2018-01-25 NOTE — Telephone Encounter (Signed)
Patient called advised he want to pick up the X-rays that he brought with him last week when he saw Erin. Patient said he will pick up the X-rays tomorrow morning. The number to contact patient is [phone]

## 2018-01-25 NOTE — Telephone Encounter (Signed)
Spoke with patient wife. Aware CD is ready for pick up at front desk.

## 2018-01-26 ENCOUNTER — Ambulatory Visit (INDEPENDENT_AMBULATORY_CARE_PROVIDER_SITE_OTHER): Payer: Medicare Other | Admitting: Family

## 2018-01-26 DIAGNOSIS — M79605 Pain in left leg: Secondary | ICD-10-CM | POA: Diagnosis not present

## 2018-01-26 DIAGNOSIS — M25552 Pain in left hip: Secondary | ICD-10-CM | POA: Diagnosis not present

## 2018-01-26 DIAGNOSIS — M545 Low back pain: Secondary | ICD-10-CM | POA: Diagnosis not present

## 2018-01-27 DIAGNOSIS — M79605 Pain in left leg: Secondary | ICD-10-CM | POA: Diagnosis not present

## 2018-01-27 DIAGNOSIS — M25552 Pain in left hip: Secondary | ICD-10-CM | POA: Diagnosis not present

## 2018-01-27 DIAGNOSIS — M545 Low back pain: Secondary | ICD-10-CM | POA: Diagnosis not present

## 2018-01-31 DIAGNOSIS — M545 Low back pain: Secondary | ICD-10-CM | POA: Diagnosis not present

## 2018-01-31 DIAGNOSIS — M79605 Pain in left leg: Secondary | ICD-10-CM | POA: Diagnosis not present

## 2018-01-31 DIAGNOSIS — M25552 Pain in left hip: Secondary | ICD-10-CM | POA: Diagnosis not present

## 2018-02-03 DIAGNOSIS — M545 Low back pain: Secondary | ICD-10-CM | POA: Diagnosis not present

## 2018-02-03 DIAGNOSIS — M25552 Pain in left hip: Secondary | ICD-10-CM | POA: Diagnosis not present

## 2018-02-03 DIAGNOSIS — M79605 Pain in left leg: Secondary | ICD-10-CM | POA: Diagnosis not present

## 2018-02-05 DIAGNOSIS — M545 Low back pain: Secondary | ICD-10-CM | POA: Diagnosis not present

## 2018-02-08 ENCOUNTER — Other Ambulatory Visit: Payer: Self-pay | Admitting: Orthopedic Surgery

## 2018-02-08 DIAGNOSIS — M545 Low back pain: Secondary | ICD-10-CM

## 2018-02-08 DIAGNOSIS — M1612 Unilateral primary osteoarthritis, left hip: Secondary | ICD-10-CM | POA: Diagnosis not present

## 2018-02-09 DIAGNOSIS — C44511 Basal cell carcinoma of skin of breast: Secondary | ICD-10-CM | POA: Diagnosis not present

## 2018-02-09 DIAGNOSIS — Z85828 Personal history of other malignant neoplasm of skin: Secondary | ICD-10-CM | POA: Diagnosis not present

## 2018-02-11 ENCOUNTER — Ambulatory Visit
Admission: RE | Admit: 2018-02-11 | Discharge: 2018-02-11 | Disposition: A | Discharge status: Home / Self Care | Payer: Medicare Other | Source: Ambulatory Visit | Attending: Orthopedic Surgery | Admitting: Orthopedic Surgery

## 2018-02-11 DIAGNOSIS — M545 Low back pain: Secondary | ICD-10-CM

## 2018-02-14 DIAGNOSIS — M5416 Radiculopathy, lumbar region: Secondary | ICD-10-CM | POA: Diagnosis not present

## 2018-02-16 DIAGNOSIS — L570 Actinic keratosis: Secondary | ICD-10-CM | POA: Diagnosis not present

## 2018-02-16 DIAGNOSIS — Z4802 Encounter for removal of sutures: Secondary | ICD-10-CM | POA: Diagnosis not present

## 2018-02-22 DIAGNOSIS — M5416 Radiculopathy, lumbar region: Secondary | ICD-10-CM | POA: Diagnosis not present

## 2018-03-08 DIAGNOSIS — M5416 Radiculopathy, lumbar region: Secondary | ICD-10-CM | POA: Diagnosis not present

## 2018-03-15 DIAGNOSIS — M25552 Pain in left hip: Secondary | ICD-10-CM | POA: Diagnosis not present

## 2018-03-15 DIAGNOSIS — M545 Low back pain: Secondary | ICD-10-CM | POA: Diagnosis not present

## 2018-03-15 DIAGNOSIS — M79605 Pain in left leg: Secondary | ICD-10-CM | POA: Diagnosis not present

## 2018-04-05 DIAGNOSIS — M5416 Radiculopathy, lumbar region: Secondary | ICD-10-CM | POA: Diagnosis not present

## 2018-04-19 DIAGNOSIS — M5416 Radiculopathy, lumbar region: Secondary | ICD-10-CM | POA: Diagnosis not present

## 2018-04-27 ENCOUNTER — Other Ambulatory Visit: Payer: Self-pay | Admitting: Orthopedic Surgery

## 2018-04-28 ENCOUNTER — Other Ambulatory Visit: Payer: Self-pay | Admitting: Orthopedic Surgery

## 2018-04-28 ENCOUNTER — Encounter (HOSPITAL_COMMUNITY): Payer: Self-pay

## 2018-04-28 NOTE — Pre-Procedure Instructions (Signed)
Samuel Ramos  04/28/2018      [id number] [place], [place] [address] [place] [address] Phone: [phone] Fax: [phone]    Your procedure is scheduled on October 23rd.  Report to [facility] Admitting at 10:30 A.M.  Call this number if you have problems the morning of surgery:  [phone]   Remember:  Do not eat or drink after midnight.     Take these medicines the morning of surgery with A SIP OF WATER   Tylenol - if needed   7 days prior to surgery STOP taking any Aspirin(unless otherwise instructed by your surgeon), Aleve, Naproxen, Ibuprofen, Motrin, Advil, Goody's, BC's, all herbal medications, fish oil, and all vitamins    Do not wear jewelry, make-up or nail polish.  Do not wear lotions, powders, or perfumes, or deodorant.  Do not shave 48 hours prior to surgery.  Men may shave face and neck.  Do not bring valuables to the hospital.  [facility] is not responsible for any belongings or valuables.   [place]- Preparing For Surgery  Before surgery, you can play an important role. Because skin is not sterile, your skin needs to be as free of germs as possible. You can reduce the number of germs on your skin by washing with CHG (chlorahexidine gluconate) Soap before surgery.  CHG is an antiseptic cleaner which kills germs and bonds with the skin to continue killing germs even after washing.    Oral Hygiene is also important to reduce your risk of infection.  Remember - BRUSH YOUR TEETH THE MORNING OF SURGERY WITH YOUR REGULAR TOOTHPASTE  Please do not use if you have an allergy to CHG or antibacterial soaps. If your skin becomes reddened/irritated stop using the CHG.  Do not shave (including legs and underarms) for at least 48 hours prior to first CHG shower. It is OK to shave your face.  Please follow these instructions carefully.   1. Shower the NIGHT BEFORE SURGERY and the MORNING OF SURGERY with CHG.    2. If you chose to wash your hair, wash your hair first as usual with your normal shampoo.  3. After you shampoo, rinse your hair and body thoroughly to remove the shampoo.  4. Use CHG as you would any other liquid soap. You can apply CHG directly to the skin and wash gently with a scrungie or a clean washcloth.   5. Apply the CHG Soap to your body ONLY FROM THE NECK DOWN.  Do not use on open wounds or open sores. Avoid contact with your eyes, ears, mouth and genitals (private parts). Wash Face and genitals (private parts)  with your normal soap.  6. Wash thoroughly, paying special attention to the area where your surgery will be performed.  7. Thoroughly rinse your body with warm water from the neck down.  8. DO NOT shower/wash with your normal soap after using and rinsing off the CHG Soap.  9. Pat yourself dry with a CLEAN TOWEL.  10. Wear CLEAN PAJAMAS to bed the night before surgery, wear comfortable clothes the morning of surgery  11. Place CLEAN SHEETS on your bed the night of your first shower and DO NOT SLEEP WITH PETS.    Day of Surgery:  Do not apply any deodorants/lotions.  Please wear clean clothes to the hospital/surgery center.   Remember to brush your teeth WITH YOUR REGULAR TOOTHPASTE.    Contacts, dentures  or bridgework may not be worn into surgery.  Leave your suitcase in the car.  After surgery it may be brought to your room.  For patients admitted to the hospital, discharge time will be determined by your treatment team.  Patients discharged the day of surgery will not be allowed to drive home.    Please read over the following fact sheets that you were given. Coughing and Deep Breathing and Surgical Site Infection Prevention

## 2018-04-29 ENCOUNTER — Other Ambulatory Visit: Payer: Self-pay

## 2018-04-29 ENCOUNTER — Encounter (HOSPITAL_COMMUNITY)
Admission: RE | Admit: 2018-04-29 | Discharge: 2018-04-29 | Disposition: A | Discharge status: Home / Self Care | Payer: Medicare Other | Source: Ambulatory Visit | Attending: Orthopedic Surgery | Admitting: Orthopedic Surgery

## 2018-04-29 ENCOUNTER — Encounter (HOSPITAL_COMMUNITY): Payer: Self-pay

## 2018-04-29 DIAGNOSIS — Z01810 Encounter for preprocedural cardiovascular examination: Secondary | ICD-10-CM | POA: Insufficient documentation

## 2018-04-29 DIAGNOSIS — Z01812 Encounter for preprocedural laboratory examination: Secondary | ICD-10-CM | POA: Insufficient documentation

## 2018-04-29 LAB — URINALYSIS, ROUTINE W REFLEX MICROSCOPIC
Bilirubin Urine: NEGATIVE
GLUCOSE, UA: 50 mg/dL — AB
HGB URINE DIPSTICK: NEGATIVE
KETONES UR: NEGATIVE mg/dL
Nitrite: NEGATIVE
PROTEIN: NEGATIVE mg/dL
Specific Gravity, Urine: 1.019 (ref 1.005–1.030)
pH: 5 (ref 5.0–8.0)

## 2018-04-29 LAB — COMPREHENSIVE METABOLIC PANEL
ALT: 16 U/L (ref 0–44)
AST: 19 U/L (ref 15–41)
Albumin: 3.4 g/dL — ABNORMAL LOW (ref 3.5–5.0)
Alkaline Phosphatase: 32 U/L — ABNORMAL LOW (ref 38–126)
Anion gap: 6 (ref 5–15)
BUN: 18 mg/dL (ref 8–23)
CALCIUM: 9.2 mg/dL (ref 8.9–10.3)
CO2: 25 mmol/L (ref 22–32)
CREATININE: 1.06 mg/dL (ref 0.61–1.24)
Chloride: 108 mmol/L (ref 98–111)
Glucose, Bld: 271 mg/dL — ABNORMAL HIGH (ref 70–99)
Potassium: 4 mmol/L (ref 3.5–5.1)
SODIUM: 139 mmol/L (ref 135–145)
Total Bilirubin: 0.5 mg/dL (ref 0.3–1.2)
Total Protein: 6 g/dL — ABNORMAL LOW (ref 6.5–8.1)

## 2018-04-29 LAB — CBC WITH DIFFERENTIAL/PLATELET
ABS IMMATURE GRANULOCYTES: 0.02 10*3/uL (ref 0.00–0.07)
Basophils Absolute: 0 10*3/uL (ref 0.0–0.1)
Basophils Relative: 0 %
EOS PCT: 3 %
Eosinophils Absolute: 0.2 10*3/uL (ref 0.0–0.5)
HEMATOCRIT: 33.4 % — AB (ref 39.0–52.0)
Hemoglobin: 11.2 g/dL — ABNORMAL LOW (ref 13.0–17.0)
Immature Granulocytes: 0 %
LYMPHS ABS: 1.1 10*3/uL (ref 0.7–4.0)
LYMPHS PCT: 17 %
MCH: 29.7 pg (ref 26.0–34.0)
MCHC: 33.5 g/dL (ref 30.0–36.0)
MCV: 88.6 fL (ref 80.0–100.0)
MONO ABS: 0.4 10*3/uL (ref 0.1–1.0)
MONOS PCT: 6 %
NEUTROS ABS: 4.8 10*3/uL (ref 1.7–7.7)
Neutrophils Relative %: 74 %
Platelets: 202 10*3/uL (ref 150–400)
RBC: 3.77 MIL/uL — ABNORMAL LOW (ref 4.22–5.81)
RDW: 13.1 % (ref 11.5–15.5)
WBC: 6.6 10*3/uL (ref 4.0–10.5)
nRBC: 0 % (ref 0.0–0.2)

## 2018-04-29 LAB — PROTIME-INR
INR: 1
Prothrombin Time: 13.1 seconds (ref 11.4–15.2)

## 2018-04-29 LAB — HEMOGLOBIN A1C
Hgb A1c MFr Bld: 9.2 % — ABNORMAL HIGH (ref 4.8–5.6)
Mean Plasma Glucose: 217.34 mg/dL

## 2018-04-29 LAB — TYPE AND SCREEN
ABO/RH(D): A POS
Antibody Screen: NEGATIVE

## 2018-04-29 LAB — ABO/RH: ABO/RH(D): A POS

## 2018-04-29 LAB — SURGICAL PCR SCREEN
MRSA, PCR: NEGATIVE
STAPHYLOCOCCUS AUREUS: NEGATIVE

## 2018-04-29 LAB — APTT: aPTT: 25 seconds (ref 24–36)

## 2018-04-29 LAB — GLUCOSE, CAPILLARY: Glucose-Capillary: 272 mg/dL — ABNORMAL HIGH (ref 70–99)

## 2018-04-29 NOTE — Progress Notes (Signed)
PCP - Dr. Leta Jungling  Cardiologist - Denies  Chest x-ray - Denies  EKG - 04/29/18  Stress Test - Denies  ECHO - Denies  Cardiac Cath - Denies  AICD- N/A PM- N/A LOOP-N/A  Sleep Study - Denies CPAP - None  LABS- CBC w/D, CMP, PT, PTT, T/S, UA, PCR  ASA- Denies  HA1C- 04/29/18 Fasting Blood Sugar - 146, Today 272 Checks Blood Sugar ___1__ times a day  Anesthesia- No  Pt denies having chest pain, sob, or fever at this time. All instructions explained to the pt, with a verbal understanding of the material. Pt agrees to go over the instructions while at home for a better understanding. The opportunity to ask questions was provided.

## 2018-04-29 NOTE — Progress Notes (Signed)
Samuel Ramos            04/28/2018                          [facility] [place], [place] [address] Samuel Ramos Samuel Ramos [address] Phone: [phone] Fax: [phone]              Your procedure is scheduled on Wed. Oct. 23, 2019 from 12:30pm-3:30pm            Report to [facility] Admitting Entrance "A" at 10:30am            Call this number if you have problems the morning of surgery:            [phone]             Remember:            Do not eat or drink after midnight on Oct. 22nd                                   Take these medicines the morning of surgery with A SIP OF WATER             Tylenol - if needed             As of today, STOP taking any Aspirin(unless otherwise instructed by your surgeon), Aleve, Naproxen, Ibuprofen, Motrin, Advil, Goody's, BC's, all herbal medications, fish oil, and all vitamins  How to Manage Your Diabetes Before and After Surgery  Why is it important to control my blood sugar before and after surgery? . Improving blood sugar levels before and after surgery helps healing and can limit problems. . A way of improving blood sugar control is eating a healthy diet by: o  Eating less sugar and carbohydrates o  Increasing activity/exercise o  Talking with your doctor about reaching your blood sugar goals . High blood sugars (greater than 180 mg/dL) can raise your risk of infections and slow your recovery, so you will need to focus on controlling your diabetes during the weeks before surgery. . Make sure that the doctor who takes care of your diabetes knows about your planned surgery including the date and location.  How do I manage my blood sugar before surgery? . Check your blood sugar at least 4 times a day, starting 2 days before surgery, to make sure that the level is not too high or low. o Check your blood sugar the morning of your surgery when you wake up and every 2 hours until you get to the  Short Stay unit. . If your blood sugar is less than 70 mg/dL, you will need to treat for low blood sugar: o Do not take insulin. o Treat a low blood sugar (less than 70 mg/dL) with  cup of clear juice (cranberry or apple), 4 glucose tablets, OR glucose gel. Recheck blood sugar in 15 minutes after treatment (to make sure it is greater than 70 mg/dL). If your blood sugar is not greater than 70 mg/dL on recheck, call [phone] o  for further instructions.  .  If your CBG is greater than 220 mg/dL, call the number above for further instructions  . If you are admitted to the hospital after surgery: o Your blood sugar will be checked by the staff and you will probably  be given insulin after surgery (instead of oral diabetes medicines) to make sure you have good blood sugar levels. o The goal for blood sugar control after surgery is 80-180 mg  WHAT DO I DO ABOUT MY DIABETES MEDICATION?  Do not take Glimepiride (AMARYL) the night before surgery.  . Do not take Glimepiride (AMARYL) and MetFORMIN (GLUCOPHAGE-XR) the morning of surgery.                        Do not wear jewelry, make-up or nail polish.            Do not wear lotions, powders, or perfumes, or deodorant.            Do not shave 48 hours prior to surgery.  Men may shave face and neck.            Do not bring valuables to the hospital.            [facility] is not responsible for any belongings or valuables.  [place]- Preparing For Surgery  Before surgery, you can play an important role. Because skin is not sterile, your skin needs to be as free of germs as possible. You can reduce the number of germs on your skin by washing with CHG (chlorahexidine gluconate) Soap before surgery.  CHG is an antiseptic cleaner which kills germs and bonds with the skin to continue killing germs even after washing.    Oral Hygiene is also important to reduce your risk of infection.  Remember - BRUSH YOUR TEETH THE MORNING OF SURGERY WITH  YOUR REGULAR TOOTHPASTE  Please do not use if you have an allergy to CHG or antibacterial soaps. If your skin becomes reddened/irritated stop using the CHG.  Do not shave (including legs and underarms) for at least 48 hours prior to first CHG shower. It is OK to shave your face.  Please follow these instructions carefully.                                                                                                                     1. Shower the NIGHT BEFORE SURGERY and the MORNING OF SURGERY with CHG.   2. If you chose to wash your hair, wash your hair first as usual with your normal shampoo.  3. After you shampoo, rinse your hair and body thoroughly to remove the shampoo.  4. Use CHG as you would any other liquid soap. You can apply CHG directly to the skin and wash gently with a scrungie or a clean washcloth.   5. Apply the CHG Soap to your body ONLY FROM THE NECK DOWN.  Do not use on open wounds or open sores. Avoid contact with your eyes, ears, mouth and genitals (private parts). Wash Face and genitals (private parts)  with your normal soap.  6. Wash thoroughly, paying special attention to the area where your surgery will be performed.  7. Thoroughly rinse your body with warm water  from the neck down.  8. DO NOT shower/wash with your normal soap after using and rinsing off the CHG Soap.  9. Pat yourself dry with a CLEAN TOWEL.  10. Wear CLEAN PAJAMAS to bed the night before surgery, wear comfortable clothes the morning of surgery  11. Place CLEAN SHEETS on your bed the night of your first shower and DO NOT SLEEP WITH PETS.  Day of Surgery:  Do not apply any deodorants/lotions.  Please wear clean clothes to the hospital/surgery center.   Remember to brush your teeth WITH YOUR REGULAR TOOTHPASTE.  Contacts, dentures or bridgework may not be worn into surgery.  Leave your suitcase in the car.  After surgery it may be brought to your room.  For patients  admitted to the hospital, discharge time will be determined by your treatment team.  Patients discharged the day of surgery will not be allowed to drive home.   Please read over the following fact sheets that you were given. Coughing and Deep Breathing and Surgical Site Infection Prevention

## 2018-05-04 ENCOUNTER — Ambulatory Visit (HOSPITAL_COMMUNITY): Payer: Medicare Other | Admitting: Anesthesiology

## 2018-05-04 ENCOUNTER — Ambulatory Visit (HOSPITAL_COMMUNITY): Payer: Medicare Other

## 2018-05-04 ENCOUNTER — Ambulatory Visit (HOSPITAL_COMMUNITY)
Admission: RE | Admit: 2018-05-04 | Discharge: 2018-05-04 | Disposition: A | Discharge status: Home / Self Care | Payer: Medicare Other | Source: Ambulatory Visit | Attending: Orthopedic Surgery | Admitting: Orthopedic Surgery

## 2018-05-04 ENCOUNTER — Encounter (HOSPITAL_COMMUNITY)
Admission: RE | Disposition: A | Discharge status: Home / Self Care | Payer: Self-pay | Source: Ambulatory Visit | Attending: Orthopedic Surgery

## 2018-05-04 ENCOUNTER — Encounter (HOSPITAL_COMMUNITY): Payer: Self-pay | Admitting: Urology

## 2018-05-04 DIAGNOSIS — M5116 Intervertebral disc disorders with radiculopathy, lumbar region: Secondary | ICD-10-CM | POA: Diagnosis not present

## 2018-05-04 DIAGNOSIS — M5416 Radiculopathy, lumbar region: Secondary | ICD-10-CM | POA: Diagnosis not present

## 2018-05-04 DIAGNOSIS — M5106 Intervertebral disc disorders with myelopathy, lumbar region: Secondary | ICD-10-CM | POA: Diagnosis not present

## 2018-05-04 DIAGNOSIS — Z79899 Other long term (current) drug therapy: Secondary | ICD-10-CM | POA: Insufficient documentation

## 2018-05-04 DIAGNOSIS — Z885 Allergy status to narcotic agent status: Secondary | ICD-10-CM | POA: Insufficient documentation

## 2018-05-04 DIAGNOSIS — Z7984 Long term (current) use of oral hypoglycemic drugs: Secondary | ICD-10-CM | POA: Insufficient documentation

## 2018-05-04 DIAGNOSIS — E119 Type 2 diabetes mellitus without complications: Secondary | ICD-10-CM | POA: Insufficient documentation

## 2018-05-04 DIAGNOSIS — Z888 Allergy status to other drugs, medicaments and biological substances status: Secondary | ICD-10-CM | POA: Diagnosis not present

## 2018-05-04 DIAGNOSIS — Z87442 Personal history of urinary calculi: Secondary | ICD-10-CM | POA: Diagnosis not present

## 2018-05-04 DIAGNOSIS — Z981 Arthrodesis status: Secondary | ICD-10-CM | POA: Diagnosis not present

## 2018-05-04 DIAGNOSIS — Z85820 Personal history of malignant melanoma of skin: Secondary | ICD-10-CM | POA: Diagnosis not present

## 2018-05-04 DIAGNOSIS — Z419 Encounter for procedure for purposes other than remedying health state, unspecified: Secondary | ICD-10-CM

## 2018-05-04 HISTORY — PX: LUMBAR LAMINECTOMY/DECOMPRESSION MICRODISCECTOMY: SHX5026

## 2018-05-04 LAB — GLUCOSE, CAPILLARY
GLUCOSE-CAPILLARY: 110 mg/dL — AB (ref 70–99)
Glucose-Capillary: 169 mg/dL — ABNORMAL HIGH (ref 70–99)
Glucose-Capillary: 152 mg/dL — ABNORMAL HIGH (ref 70–99)

## 2018-05-04 SURGERY — LUMBAR LAMINECTOMY/DECOMPRESSION MICRODISCECTOMY
Anesthesia: General | Site: Back | Laterality: Left

## 2018-05-04 MED ORDER — ONDANSETRON HCL 4 MG/2ML IJ SOLN
INTRAMUSCULAR | Status: AC
  Filled 2018-05-04: qty 2

## 2018-05-04 MED ORDER — PHENYLEPHRINE 40 MCG/ML (10ML) SYRINGE FOR IV PUSH (FOR BLOOD PRESSURE SUPPORT)
PREFILLED_SYRINGE | INTRAVENOUS | Status: AC
  Filled 2018-05-04: qty 20

## 2018-05-04 MED ORDER — LACTATED RINGERS IV SOLN
INTRAVENOUS | Status: DC
  Administered 2018-05-04 (×3): via INTRAVENOUS

## 2018-05-04 MED ORDER — FENTANYL CITRATE (PF) 100 MCG/2ML IJ SOLN
INTRAMUSCULAR | Status: DC | PRN
  Administered 2018-05-04: 100 ug via INTRAVENOUS
  Administered 2018-05-04 (×2): 50 ug via INTRAVENOUS

## 2018-05-04 MED ORDER — ROCURONIUM BROMIDE 50 MG/5ML IV SOSY
PREFILLED_SYRINGE | INTRAVENOUS | Status: AC
  Filled 2018-05-04: qty 5

## 2018-05-04 MED ORDER — METHYLPREDNISOLONE ACETATE 40 MG/ML IJ SUSP
INTRAMUSCULAR | Status: AC
  Filled 2018-05-04: qty 1

## 2018-05-04 MED ORDER — METHYLENE BLUE 0.5 % INJ SOLN
INTRAVENOUS | Status: DC | PRN
  Administered 2018-05-04: 1 mL via INTRADERMAL

## 2018-05-04 MED ORDER — METHYLPREDNISOLONE ACETATE 40 MG/ML IJ SUSP
INTRAMUSCULAR | Status: DC | PRN
  Administered 2018-05-04: 20 mg

## 2018-05-04 MED ORDER — CEFAZOLIN SODIUM-DEXTROSE 2-4 GM/100ML-% IV SOLN
INTRAVENOUS | Status: AC
  Filled 2018-05-04: qty 100

## 2018-05-04 MED ORDER — SODIUM CHLORIDE 0.9 % IV SOLN
INTRAVENOUS | Status: DC | PRN
  Administered 2018-05-04: 40 ug/min via INTRAVENOUS

## 2018-05-04 MED ORDER — ROCURONIUM BROMIDE 50 MG/5ML IV SOSY
PREFILLED_SYRINGE | INTRAVENOUS | Status: AC
  Filled 2018-05-04: qty 10

## 2018-05-04 MED ORDER — ROCURONIUM BROMIDE 100 MG/10ML IV SOLN
INTRAVENOUS | Status: DC | PRN
  Administered 2018-05-04: 50 mg via INTRAVENOUS

## 2018-05-04 MED ORDER — BUPIVACAINE-EPINEPHRINE 0.25% -1:200000 IJ SOLN
INTRAMUSCULAR | Status: DC | PRN
  Administered 2018-05-04: 4 mL
  Administered 2018-05-04: 20 mL

## 2018-05-04 MED ORDER — SUGAMMADEX SODIUM 200 MG/2ML IV SOLN
INTRAVENOUS | Status: DC | PRN
  Administered 2018-05-04: 200 mg via INTRAVENOUS

## 2018-05-04 MED ORDER — LIDOCAINE 2% (20 MG/ML) 5 ML SYRINGE
INTRAMUSCULAR | Status: AC
  Filled 2018-05-04: qty 5

## 2018-05-04 MED ORDER — METHYLENE BLUE 0.5 % INJ SOLN
INTRAVENOUS | Status: AC
  Filled 2018-05-04: qty 10

## 2018-05-04 MED ORDER — PROPOFOL 10 MG/ML IV BOLUS
INTRAVENOUS | Status: DC | PRN
  Administered 2018-05-04: 150 mg via INTRAVENOUS

## 2018-05-04 MED ORDER — FENTANYL CITRATE (PF) 100 MCG/2ML IJ SOLN
25.0000 ug | INTRAMUSCULAR | Status: DC | PRN
  Administered 2018-05-04 (×2): 50 ug via INTRAVENOUS

## 2018-05-04 MED ORDER — PROPOFOL 10 MG/ML IV BOLUS
INTRAVENOUS | Status: AC
  Filled 2018-05-04: qty 20

## 2018-05-04 MED ORDER — LIDOCAINE HCL (CARDIAC) PF 100 MG/5ML IV SOSY
PREFILLED_SYRINGE | INTRAVENOUS | Status: DC | PRN
  Administered 2018-05-04: 100 mg via INTRAVENOUS

## 2018-05-04 MED ORDER — FENTANYL CITRATE (PF) 250 MCG/5ML IJ SOLN
INTRAMUSCULAR | Status: AC
  Filled 2018-05-04: qty 5

## 2018-05-04 MED ORDER — POVIDONE-IODINE 7.5 % EX SOLN
Freq: Once | CUTANEOUS | Status: DC
  Filled 2018-05-04: qty 118

## 2018-05-04 MED ORDER — OXYCODONE-ACETAMINOPHEN 5-325 MG PO TABS
ORAL_TABLET | ORAL | Status: AC
  Administered 2018-05-04: 1
  Filled 2018-05-04: qty 1

## 2018-05-04 MED ORDER — BUPIVACAINE LIPOSOME 1.3 % IJ SUSP
20.0000 mL | INTRAMUSCULAR | Status: AC
  Administered 2018-05-04: 20 mL
  Filled 2018-05-04: qty 20

## 2018-05-04 MED ORDER — DEXAMETHASONE SODIUM PHOSPHATE 10 MG/ML IJ SOLN
INTRAMUSCULAR | Status: AC
  Filled 2018-05-04: qty 1

## 2018-05-04 MED ORDER — PHENYLEPHRINE HCL 10 MG/ML IJ SOLN
INTRAMUSCULAR | Status: DC | PRN
  Administered 2018-05-04 (×4): 80 ug via INTRAVENOUS

## 2018-05-04 MED ORDER — BUPIVACAINE-EPINEPHRINE (PF) 0.25% -1:200000 IJ SOLN
INTRAMUSCULAR | Status: AC
  Filled 2018-05-04: qty 30

## 2018-05-04 MED ORDER — THROMBIN 20000 UNITS EX SOLR
CUTANEOUS | Status: DC | PRN
  Administered 2018-05-04: 10 mL via TOPICAL

## 2018-05-04 MED ORDER — CEFAZOLIN SODIUM-DEXTROSE 2-4 GM/100ML-% IV SOLN
2.0000 g | INTRAVENOUS | Status: AC
  Administered 2018-05-04: 2 g via INTRAVENOUS

## 2018-05-04 MED ORDER — FENTANYL CITRATE (PF) 100 MCG/2ML IJ SOLN
INTRAMUSCULAR | Status: AC
  Administered 2018-05-04: 50 ug via INTRAVENOUS
  Filled 2018-05-04: qty 2

## 2018-05-04 MED ORDER — MIDAZOLAM HCL 2 MG/2ML IJ SOLN
INTRAMUSCULAR | Status: AC
  Filled 2018-05-04: qty 2

## 2018-05-04 MED ORDER — THROMBIN (RECOMBINANT) 20000 UNITS EX SOLR
CUTANEOUS | Status: AC
  Filled 2018-05-04: qty 20000

## 2018-05-04 MED ORDER — 0.9 % SODIUM CHLORIDE (POUR BTL) OPTIME
TOPICAL | Status: DC | PRN
  Administered 2018-05-04: 1000 mL

## 2018-05-04 MED ORDER — OXYCODONE-ACETAMINOPHEN 5-325 MG PO TABS: 1.0000 | ORAL_TABLET | Freq: Once | ORAL | Status: DC

## 2018-05-04 SURGICAL SUPPLY — 70 items
BENZOIN TINCTURE PRP APPL 2/3 (GAUZE/BANDAGES/DRESSINGS) ×3 IMPLANT
BUR PRECISION FLUTE 5.0 (BURR) ×3 IMPLANT
CABLE BIPOLOR RESECTION CORD (MISCELLANEOUS) ×3 IMPLANT
CANISTER SUCT 3000ML PPV (MISCELLANEOUS) ×3 IMPLANT
CARTRIDGE OIL MAESTRO DRILL (MISCELLANEOUS) ×1 IMPLANT
CLOSURE WOUND 1/2 X4 (GAUZE/BANDAGES/DRESSINGS) ×1
COVER SURGICAL LIGHT HANDLE (MISCELLANEOUS) ×3 IMPLANT
COVER WAND RF STERILE (DRAPES) ×3 IMPLANT
DIFFUSER DRILL AIR PNEUMATIC (MISCELLANEOUS) ×3 IMPLANT
DRAIN CHANNEL 15F RND FF W/TCR (WOUND CARE) IMPLANT
DRAPE POUCH INSTRU U-SHP 10X18 (DRAPES) ×6 IMPLANT
DRAPE SURG 17X23 STRL (DRAPES) ×12 IMPLANT
DURAPREP 26ML APPLICATOR (WOUND CARE) ×3 IMPLANT
ELECT BLADE 4.0 EZ CLEAN MEGAD (MISCELLANEOUS) ×3
ELECT CAUTERY BLADE 6.4 (BLADE) ×3 IMPLANT
ELECT REM PT RETURN 9FT ADLT (ELECTROSURGICAL) ×3
ELECTRODE BLDE 4.0 EZ CLN MEGD (MISCELLANEOUS) ×1 IMPLANT
ELECTRODE REM PT RTRN 9FT ADLT (ELECTROSURGICAL) ×1 IMPLANT
EVACUATOR SILICONE 100CC (DRAIN) IMPLANT
FILTER STRAW FLUID ASPIR (MISCELLANEOUS) ×3 IMPLANT
GAUZE 4X4 16PLY RFD (DISPOSABLE) ×6 IMPLANT
GAUZE SPONGE 4X4 12PLY STRL (GAUZE/BANDAGES/DRESSINGS) ×3 IMPLANT
GLOVE BIO SURGEON STRL SZ7 (GLOVE) ×6 IMPLANT
GLOVE BIO SURGEON STRL SZ8 (GLOVE) ×6 IMPLANT
GLOVE BIOGEL PI IND STRL 7.0 (GLOVE) ×1 IMPLANT
GLOVE BIOGEL PI IND STRL 8 (GLOVE) ×1 IMPLANT
GLOVE BIOGEL PI INDICATOR 7.0 (GLOVE) ×2
GLOVE BIOGEL PI INDICATOR 8 (GLOVE) ×2
GOWN STRL REUS W/ TWL LRG LVL3 (GOWN DISPOSABLE) ×2 IMPLANT
GOWN STRL REUS W/ TWL XL LVL3 (GOWN DISPOSABLE) ×2 IMPLANT
GOWN STRL REUS W/TWL LRG LVL3 (GOWN DISPOSABLE) ×4
GOWN STRL REUS W/TWL XL LVL3 (GOWN DISPOSABLE) ×4
IV CATH 14GX2 1/4 (CATHETERS) ×3 IMPLANT
KIT BASIN OR (CUSTOM PROCEDURE TRAY) ×3 IMPLANT
KIT POSITION SURG JACKSON T1 (MISCELLANEOUS) ×3 IMPLANT
KIT TURNOVER KIT B (KITS) ×3 IMPLANT
NEEDLE 18GX1X1/2 (RX/OR ONLY) (NEEDLE) ×6 IMPLANT
NEEDLE 22X1 1/2 (OR ONLY) (NEEDLE) ×6 IMPLANT
NEEDLE HYPO 25GX1X1/2 BEV (NEEDLE) ×3 IMPLANT
NEEDLE SPNL 18GX3.5 QUINCKE PK (NEEDLE) ×6 IMPLANT
NS IRRIG 1000ML POUR BTL (IV SOLUTION) ×3 IMPLANT
OIL CARTRIDGE MAESTRO DRILL (MISCELLANEOUS) ×3
PACK LAMINECTOMY ORTHO (CUSTOM PROCEDURE TRAY) ×3 IMPLANT
PACK UNIVERSAL I (CUSTOM PROCEDURE TRAY) ×3 IMPLANT
PAD ARMBOARD 7.5X6 YLW CONV (MISCELLANEOUS) ×9 IMPLANT
PATTIES SURGICAL .5 X.5 (GAUZE/BANDAGES/DRESSINGS) IMPLANT
PATTIES SURGICAL .5 X1 (DISPOSABLE) IMPLANT
SPONGE INTESTINAL PEANUT (DISPOSABLE) ×3 IMPLANT
SPONGE SURGIFOAM ABS GEL 100 (HEMOSTASIS) ×3 IMPLANT
SPONGE SURGIFOAM ABS GEL SZ50 (HEMOSTASIS) ×3 IMPLANT
STRIP CLOSURE SKIN 1/2X4 (GAUZE/BANDAGES/DRESSINGS) ×2 IMPLANT
SURGIFLO W/THROMBIN 8M KIT (HEMOSTASIS) IMPLANT
SUT MNCRL AB 4-0 PS2 18 (SUTURE) ×3 IMPLANT
SUT VIC AB 0 CT1 18XCR BRD 8 (SUTURE) ×1 IMPLANT
SUT VIC AB 0 CT1 27 (SUTURE)
SUT VIC AB 0 CT1 27XBRD ANBCTR (SUTURE) IMPLANT
SUT VIC AB 0 CT1 8-18 (SUTURE) ×2
SUT VIC AB 1 CT1 18XCR BRD 8 (SUTURE) ×1 IMPLANT
SUT VIC AB 1 CT1 8-18 (SUTURE) ×2
SUT VIC AB 2-0 CT2 18 VCP726D (SUTURE) ×3 IMPLANT
SYR 20CC LL (SYRINGE) ×3 IMPLANT
SYR BULB IRRIGATION 50ML (SYRINGE) ×3 IMPLANT
SYR CONTROL 10ML LL (SYRINGE) ×9 IMPLANT
SYR TB 1ML 26GX3/8 SAFETY (SYRINGE) ×3 IMPLANT
SYR TB 1ML LUER SLIP (SYRINGE) ×6 IMPLANT
TAPE CLOTH SURG 6X10 WHT LF (GAUZE/BANDAGES/DRESSINGS) ×3 IMPLANT
TOWEL GREEN STERILE (TOWEL DISPOSABLE) ×3 IMPLANT
TOWEL GREEN STERILE FF (TOWEL DISPOSABLE) ×3 IMPLANT
WATER STERILE IRR 1000ML POUR (IV SOLUTION) IMPLANT
YANKAUER SUCT BULB TIP NO VENT (SUCTIONS) ×3 IMPLANT

## 2018-05-04 NOTE — Transfer of Care (Signed)
Immediate Anesthesia Transfer of Care Note  Patient: Samuel Ramos  Procedure(s) Performed: LEFT-SIDED LUMBAR 4-5 MICRODISECTOMY (Left Back)  Patient Location: PACU  Anesthesia Type:General  Level of Consciousness: awake and alert   Airway & Oxygen Therapy: Patient Spontanous Breathing and Patient connected to nasal cannula oxygen  Post-op Assessment: Report given to RN, Post -op Vital signs reviewed and stable and Patient moving all extremities  Post vital signs: Reviewed and stable  Last Vitals:  Vitals Value Taken Time  BP 130/60 05/04/2018  3:22 PM  Temp 36.5 C 05/04/2018  3:23 PM  Pulse 77 05/04/2018  3:34 PM  Resp 9 05/04/2018  3:34 PM  SpO2 100 % 05/04/2018  3:34 PM  Vitals shown include unvalidated device data.  Last Pain:  Vitals:   05/04/18 1523  TempSrc:   PainSc: 0-No pain         Complications: No apparent anesthesia complications

## 2018-05-04 NOTE — H&P (Signed)
PREOPERATIVE H&P  Chief Complaint: Left leg pain  HPI: Samuel Ramos is a 77 y.o. male who presents with ongoing pain in the left leg  MRI reveals a left L4/5 HNP  Patient has failed multiple forms of conservative care and continues to have pain (see office notes for additional details regarding the patient's full course of treatment)  Past Medical History:  Diagnosis Date  . BPH (benign prostatic hyperplasia)   . Cancer ([place])    melanoma left arm and back removed  . Diabetes mellitus without complication ([place])    Type II  . Diverticulitis   . Pancreatitis    many years ago  . Renal stone    Past Surgical History:  Procedure Laterality Date  . PROSTATECTOMY N/A 01/21/2015   Procedure: PROSTATECTOMY RETROPUBIC;  Surgeon: Carolan Clines, MD;  Location: [facility];  Service: Urology;  Laterality: N/A;  . right leg broken (tibia and fibula) 1961    . SHOULDER ARTHROSCOPY WITH OPEN ROTATOR CUFF REPAIR  2002   right  . TONSILLECTOMY    . WISDOM TOOTH EXTRACTION     Social History   Socioeconomic History  . Marital status: Married    Spouse name: Not on file  . Number of children: Not on file  . Years of education: Not on file  . Highest education level: Not on file  Occupational History  . Not on file  Social Needs  . Financial resource strain: Not on file  . Food insecurity:    Worry: Not on file    Inability: Not on file  . Transportation needs:    Medical: Not on file    Non-medical: Not on file  Tobacco Use  . Smoking status: Never Smoker  . Smokeless tobacco: Never Used  Substance and Sexual Activity  . Alcohol use: Yes    Comment: occasional  . Drug use: No  . Sexual activity: Not on file  Lifestyle  . Physical activity:    Days per week: Not on file    Minutes per session: Not on file  . Stress: Not on file  Relationships  . Social connections:    Talks on phone: Not on file    Gets together: Not on file    Attends religious service: Not on  file    Active member of club or organization: Not on file    Attends meetings of clubs or organizations: Not on file    Relationship status: Not on file  Other Topics Concern  . Not on file  Social History Narrative  . Not on file   Family History  Problem Relation Age of Onset  . Melanoma Father   . Lymphoma Sister   . Uterine cancer Mother   . Pancreatitis Neg Hx    Allergies  Allergen Reactions  . Januvia [Sitagliptin] Other (See Comments)    Pancreatitis   . Gabapentin Other (See Comments)    Fatigue and cramps   . Vicodin [Hydrocodone-Acetaminophen] Other (See Comments)    Nightmares   Prior to Admission medications   Medication Sig Start Date End Date Taking? Authorizing Provider  acetaminophen (TYLENOL) 500 MG tablet Take 1,000 mg by mouth 3 (three) times daily as needed for moderate pain or headache.   Yes [provider]  Coenzyme Q10 (COQ-10) 200 MG CAPS Take 200 mg by mouth daily.   Yes [provider]  ferrous sulfate 325 (65 FE) MG EC tablet Take 325 mg by mouth 2 (  two) times daily.   Yes [provider]  glimepiride (AMARYL) 4 MG tablet Take 4 mg by mouth 2 (two) times daily.   Yes [provider]  lisinopril (PRINIVIL,ZESTRIL) 5 MG tablet Take 5 mg by mouth every evening.   Yes [provider]  Melatonin 10 MG TABS Take 10 mg by mouth at bedtime.   Yes [provider]  meloxicam (MOBIC) 15 MG tablet Take 15 mg by mouth daily. 04/08/18  Yes [provider]  metFORMIN (GLUCOPHAGE-XR) 500 MG 24 hr tablet Take 1,000 mg by mouth 2 (two) times daily.   Yes [provider]  bisacodyl (DULCOLAX) 5 MG EC tablet Take 5 mg by mouth daily.    [provider]  predniSONE (DELTASONE) 50 MG tablet Take one tablet once daily x 5 days Patient not taking: Reported on 04/27/2018 01/20/18   Suzan Slick, NP  saccharomyces boulardii (FLORASTOR) 250 MG capsule Take 250 mg by mouth daily.    [provider]     All other systems have been reviewed and were otherwise negative with the exception of those mentioned in the HPI and as above.  Physical Exam: There were no vitals filed for this visit.  There is no height or weight on file to calculate BMI.  General: Alert, no acute distress Cardiovascular: No pedal edema Respiratory: No cyanosis, no use of accessory musculature Skin: No lesions in the area of chief complaint Neurologic: Sensation intact distally Psychiatric: Patient is competent for consent with normal mood and affect Lymphatic: No axillary or cervical lymphadenopathy  MUSCULOSKELETAL: + SLR on the left  Assessment/Plan: LEFT LUMBAR 5 RADICULOPATHY Plan for Procedure(s): LEFT-SIDED LUMBAR 4-5 MICRODISCETOMY   [name], MD 05/04/2018 6:47 AM

## 2018-05-04 NOTE — Op Note (Signed)
NAME:  Samuel Ramos          MEDICAL RECORD NO.:  [id number]  PHYSICIAN:  Phylliss Bob, MD      DATE OF BIRTH:  26-Apr-1941  DATE OF PROCEDURE:  05/04/2018                              OPERATIVE REPORT   PREOPERATIVE DIAGNOSES: 1. Left-sided L5 radiculopathy. 2. Left-sided L4/5 disk herniation causing      compression of the left L5 nerve.  POSTOPERATIVE DIAGNOSES: 1. Left-sided L5 radiculopathy. 2. Left-sided L4/5 disk herniation causing      compression of the left L5 nerve.  PROCEDURES:  Left-sided L4/5 laminotomy with partial facetectomy and removal of herniated left-sided L4/5 disk fragment.  SURGEON:  Phylliss Bob, MD.  ASSISTANTPricilla Holm, PA-C.  ANESTHESIA:  General endotracheal anesthesia.  COMPLICATIONS:  None.  DISPOSITION:  Stable.  ESTIMATED BLOOD LOSS:  Minimal.  INDICATIONS FOR SURGERY:  Briefly, Mr. Samuel Ramos is a pleasant 77 year old male, who did present to me with severe pain in the left leg.  The patient's MRI did reveal the findings outlined above.  Extensive work-up was performed in order to ensure that his pain was in fact coming from the herniation noted above.  His work-up did strongly suggest that his pain was coming from the L4-5 herniated disc fragment.  As such, we did ultimately elect to proceed with the procedure reflected above.  The patient was fully made aware of the risks of surgery, including the risk of recurrent herniation and the need for subsequent surgery, including the possibility of a subsequent diskectomy and/or fusion.  OPERATIVE DETAILS:  On 05/04/2018, the patient was brought to surgery and general endotracheal anesthesia was administered.  The patient was placed prone on a well-padded flat Jackson bed with a spinal frame.  Antibiotics were given.  The back was prepped and draped and a time-out procedure was performed.  At this point, a midline incision was made directly over the L4/5 intervertebral space.   A curvilinear incision was made just to the left of the midline into the fascia.  A self-retaining McCulloch retractor was placed.  The lamina of L4 and L5 was identified and subperiosteally exposed.  I then removed the lateral aspect of the L4/5 ligamentum flavum.    A left-sided L4-5 partial facetectomy was also performed.   Readily identified was the traversing left L5 nerve, which was noted to be under obvious tension, and was noted to be rather erythematous.  I was able to gently gain medial retraction of the nerve, and in doing so, a large herniated disk fragment was readily noted.  The fragment was noted to be migrated inferiorly, behind the L5 vertebral body. A laminotomy was carried inferiorly, removing the superior aspect of L5 on the left, in order to be able to additionally and safely mobilize the left L5 nerve. The disc herniation was uneventfully removed in multiple fragments.  At this point, the traversing left L5 nerve was evaluated, and was noted to be free and mobile, and entirely free of any compression. I was very pleased with the final decompression that I was able to accomplish.  At this point, the wound was copiously irrigated with normal saline.  All epidural bleeding was controlled using bipolar electrocautery in addition to Surgiflo. All bleeding was controlled at the termination of the procedure.  At this point, 20 mg of Depo-Medrol was introduced about the  epidural space in the region of the left L5 nerve.  The wound was then closed in layers using #1 Vicryl followed by 0 Vicryl, followed by 4-0 Monocryl. Benzoin and Steri-Strips were applied followed by a sterile dressing. All instrument counts were correct at the termination of the procedure.  Of note, Pricilla Holm was my assistant throughout surgery, and did aid in retraction, suctioning, and closure from start to finish.     Phylliss Bob, MD

## 2018-05-04 NOTE — Anesthesia Procedure Notes (Signed)
Procedure Name: Intubation Date/Time: 05/04/2018 12:47 PM Performed by: Garrette Caine T, CRNA Pre-anesthesia Checklist: Patient identified, Emergency Drugs available, Suction available and Patient being monitored Patient Re-evaluated:Patient Re-evaluated prior to induction Oxygen Delivery Method: Circle system utilized Preoxygenation: Pre-oxygenation with 100% oxygen Induction Type: IV induction Ventilation: Mask ventilation without difficulty Laryngoscope Size: Mac and 4 Grade View: Grade I Tube type: Oral Tube size: 7.5 mm Number of attempts: 1 Airway Equipment and Method: Patient positioned with wedge pillow and Stylet Placement Confirmation: ETT inserted through vocal cords under direct vision,  positive ETCO2 and breath sounds checked- equal and bilateral Secured at: 22 cm Tube secured with: Tape Dental Injury: Teeth and Oropharynx as per pre-operative assessment

## 2018-05-04 NOTE — Anesthesia Preprocedure Evaluation (Addendum)
Anesthesia Evaluation  Patient identified by MRN, date of birth, ID band Patient awake    Reviewed: Allergy & Precautions, NPO status , Patient's Chart, lab work & pertinent test results  Airway Mallampati: III  TM Distance: >3 FB Neck ROM: Full    Dental no notable dental hx. (+) Teeth Intact, Dental Advisory Given   Pulmonary neg pulmonary ROS,    Pulmonary exam normal breath sounds clear to auscultation       Cardiovascular negative cardio ROS Normal cardiovascular exam Rhythm:Regular Rate:Normal     Neuro/Psych negative neurological ROS  negative psych ROS   GI/Hepatic negative GI ROS, Neg liver ROS,   Endo/Other  negative endocrine ROSdiabetes, Type 2, Oral Hypoglycemic Agents, Insulin Dependent  Renal/GU negative Renal ROS  negative genitourinary   Musculoskeletal negative musculoskeletal ROS (+)   Abdominal   Peds  Hematology negative hematology ROS (+)   Anesthesia Other Findings LLE paresthesias  Reproductive/Obstetrics                           Anesthesia Physical Anesthesia Plan  ASA: II  Anesthesia Plan: General   Post-op Pain Management:    Induction: Intravenous  PONV Risk Score and Plan: 2 and Dexamethasone and Ondansetron  Airway Management Planned: Oral ETT  Additional Equipment:   Intra-op Plan:   Post-operative Plan: Extubation in OR  Informed Consent: I have reviewed the patients History and Physical, chart, labs and discussed the procedure including the risks, benefits and alternatives for the proposed anesthesia with the patient or authorized representative who has indicated his/her understanding and acceptance.   Dental advisory given  Plan Discussed with: CRNA  Anesthesia Plan Comments:         Anesthesia Quick Evaluation

## 2018-05-05 ENCOUNTER — Encounter (HOSPITAL_COMMUNITY): Payer: Self-pay | Admitting: Orthopedic Surgery

## 2018-05-08 NOTE — Anesthesia Postprocedure Evaluation (Addendum)
Anesthesia Post Note  Patient: Samuel Ramos  Procedure(s) Performed: LEFT-SIDED LUMBAR 4-5 MICRODISECTOMY (Left Back)     Patient location during evaluation: PACU Anesthesia Type: General Level of consciousness: sedated Pain management: pain level controlled Vital Signs Assessment: post-procedure vital signs reviewed and stable Respiratory status: spontaneous breathing and respiratory function stable Cardiovascular status: stable Postop Assessment: no apparent nausea or vomiting Anesthetic complications: no    Last Vitals:  Vitals:   05/04/18 1637 05/04/18 1650  BP: 127/65   Pulse: 68   Resp: (!) 5   Temp:  36.5 C  SpO2: 99%     Last Pain:  Vitals:   05/04/18 1650  TempSrc:   PainSc: 3                  [name]

## 2018-05-08 NOTE — Addendum Note (Signed)
Addendum  created 05/08/18 2029 by Duane Boston, MD   Sign clinical note

## 2018-05-18 DIAGNOSIS — Z9889 Other specified postprocedural states: Secondary | ICD-10-CM | POA: Diagnosis not present

## 2018-05-24 DIAGNOSIS — Z23 Encounter for immunization: Secondary | ICD-10-CM | POA: Diagnosis not present

## 2018-06-01 DIAGNOSIS — B009 Herpesviral infection, unspecified: Secondary | ICD-10-CM | POA: Diagnosis not present

## 2018-06-01 DIAGNOSIS — E1121 Type 2 diabetes mellitus with diabetic nephropathy: Secondary | ICD-10-CM | POA: Diagnosis not present

## 2018-06-01 DIAGNOSIS — Z1389 Encounter for screening for other disorder: Secondary | ICD-10-CM | POA: Diagnosis not present

## 2018-06-01 DIAGNOSIS — Z79899 Other long term (current) drug therapy: Secondary | ICD-10-CM | POA: Diagnosis not present

## 2018-06-01 DIAGNOSIS — M545 Low back pain: Secondary | ICD-10-CM | POA: Diagnosis not present

## 2018-06-01 DIAGNOSIS — Z0000 Encounter for general adult medical examination without abnormal findings: Secondary | ICD-10-CM | POA: Diagnosis not present

## 2018-06-01 DIAGNOSIS — Z125 Encounter for screening for malignant neoplasm of prostate: Secondary | ICD-10-CM | POA: Diagnosis not present

## 2018-06-01 DIAGNOSIS — E7800 Pure hypercholesterolemia, unspecified: Secondary | ICD-10-CM | POA: Diagnosis not present

## 2018-06-01 DIAGNOSIS — I10 Essential (primary) hypertension: Secondary | ICD-10-CM | POA: Diagnosis not present

## 2018-06-20 DIAGNOSIS — Z9889 Other specified postprocedural states: Secondary | ICD-10-CM | POA: Diagnosis not present

## 2018-06-29 DIAGNOSIS — D692 Other nonthrombocytopenic purpura: Secondary | ICD-10-CM | POA: Diagnosis not present

## 2018-06-29 DIAGNOSIS — M5127 Other intervertebral disc displacement, lumbosacral region: Secondary | ICD-10-CM | POA: Diagnosis not present

## 2018-06-29 DIAGNOSIS — D2271 Melanocytic nevi of right lower limb, including hip: Secondary | ICD-10-CM | POA: Diagnosis not present

## 2018-06-29 DIAGNOSIS — M545 Low back pain: Secondary | ICD-10-CM | POA: Diagnosis not present

## 2018-06-29 DIAGNOSIS — D2262 Melanocytic nevi of left upper limb, including shoulder: Secondary | ICD-10-CM | POA: Diagnosis not present

## 2018-06-29 DIAGNOSIS — D225 Melanocytic nevi of trunk: Secondary | ICD-10-CM | POA: Diagnosis not present

## 2018-06-29 DIAGNOSIS — D2261 Melanocytic nevi of right upper limb, including shoulder: Secondary | ICD-10-CM | POA: Diagnosis not present

## 2018-06-29 DIAGNOSIS — L570 Actinic keratosis: Secondary | ICD-10-CM | POA: Diagnosis not present

## 2018-06-29 DIAGNOSIS — D0462 Carcinoma in situ of skin of left upper limb, including shoulder: Secondary | ICD-10-CM | POA: Diagnosis not present

## 2018-06-29 DIAGNOSIS — Z85828 Personal history of other malignant neoplasm of skin: Secondary | ICD-10-CM | POA: Diagnosis not present

## 2018-06-29 DIAGNOSIS — M79605 Pain in left leg: Secondary | ICD-10-CM | POA: Diagnosis not present

## 2018-06-29 DIAGNOSIS — L821 Other seborrheic keratosis: Secondary | ICD-10-CM | POA: Diagnosis not present

## 2018-06-29 DIAGNOSIS — D1801 Hemangioma of skin and subcutaneous tissue: Secondary | ICD-10-CM | POA: Diagnosis not present

## 2018-06-29 DIAGNOSIS — M25552 Pain in left hip: Secondary | ICD-10-CM | POA: Diagnosis not present

## 2018-07-12 DIAGNOSIS — E1121 Type 2 diabetes mellitus with diabetic nephropathy: Secondary | ICD-10-CM | POA: Diagnosis not present

## 2018-07-12 DIAGNOSIS — I10 Essential (primary) hypertension: Secondary | ICD-10-CM | POA: Diagnosis not present

## 2018-08-01 DIAGNOSIS — Z9889 Other specified postprocedural states: Secondary | ICD-10-CM | POA: Diagnosis not present

## 2018-08-12 DIAGNOSIS — E1121 Type 2 diabetes mellitus with diabetic nephropathy: Secondary | ICD-10-CM | POA: Diagnosis not present

## 2018-08-12 DIAGNOSIS — I10 Essential (primary) hypertension: Secondary | ICD-10-CM | POA: Diagnosis not present

## 2018-09-01 DIAGNOSIS — E7800 Pure hypercholesterolemia, unspecified: Secondary | ICD-10-CM | POA: Diagnosis not present

## 2018-09-01 DIAGNOSIS — D509 Iron deficiency anemia, unspecified: Secondary | ICD-10-CM | POA: Diagnosis not present

## 2018-09-01 DIAGNOSIS — I10 Essential (primary) hypertension: Secondary | ICD-10-CM | POA: Diagnosis not present

## 2018-09-01 DIAGNOSIS — E1121 Type 2 diabetes mellitus with diabetic nephropathy: Secondary | ICD-10-CM | POA: Diagnosis not present

## 2018-09-05 DIAGNOSIS — E1121 Type 2 diabetes mellitus with diabetic nephropathy: Secondary | ICD-10-CM | POA: Diagnosis not present

## 2018-09-05 DIAGNOSIS — E7800 Pure hypercholesterolemia, unspecified: Secondary | ICD-10-CM | POA: Diagnosis not present

## 2018-09-05 DIAGNOSIS — Z7984 Long term (current) use of oral hypoglycemic drugs: Secondary | ICD-10-CM | POA: Diagnosis not present

## 2018-09-05 DIAGNOSIS — I10 Essential (primary) hypertension: Secondary | ICD-10-CM | POA: Diagnosis not present

## 2018-09-05 DIAGNOSIS — D509 Iron deficiency anemia, unspecified: Secondary | ICD-10-CM | POA: Diagnosis not present

## 2018-09-23 IMAGING — MR MR LUMBAR SPINE W/O CM
4 of 5 series · 18 of 48 positions shown · non-contrast
Comparison: Radiography 12/10/2017.

CLINICAL DATA: Fell in [REDACTED] with subsequent back
pain and left lower extremity pain.

EXAM:
MRI LUMBAR SPINE WITHOUT CONTRAST
TECHNIQUE: Multiplanar, multisequence MR imaging of the lumbar spine was
performed. No intravenous contrast was administered.

[Series 5: T2 · sagittal · 4.0mm · 0.73mm/px · 5 of 15 slices shown (1 of 2)]
[im 1/15]
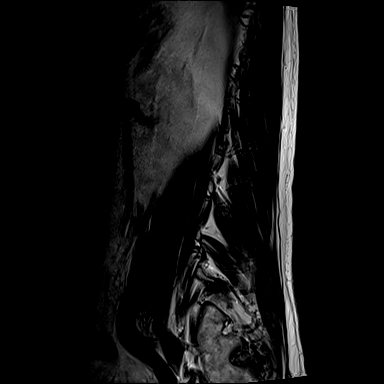
[im 4/15]
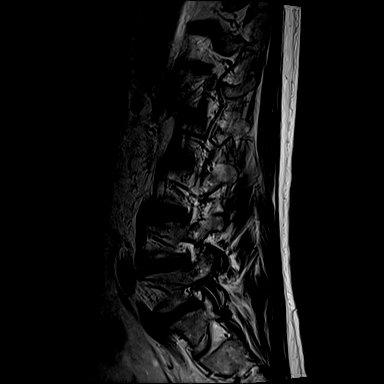
[im 8/15]
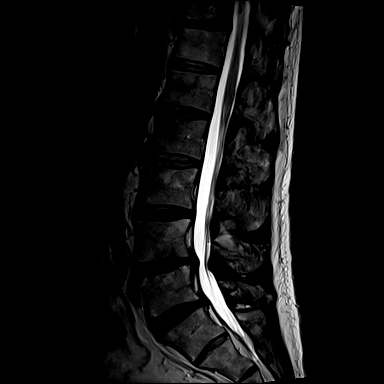
[im 11/15]
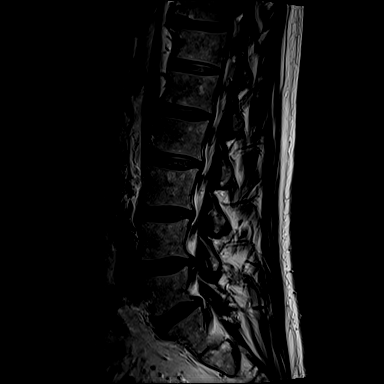
[im 15/15]
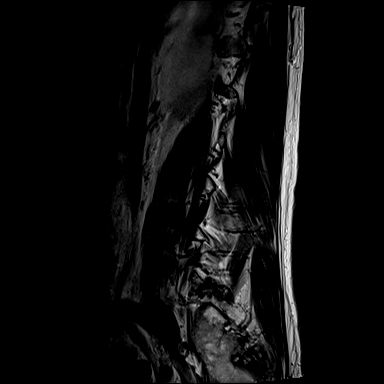

[Series 6: T1 · sagittal · 4.0mm · 0.73mm/px · 3 of 15 slices shown (1 of 2)]
[im 1/15]
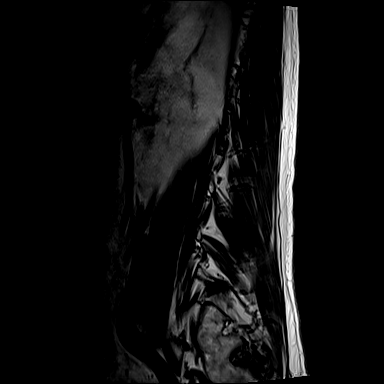
[im 8/15]
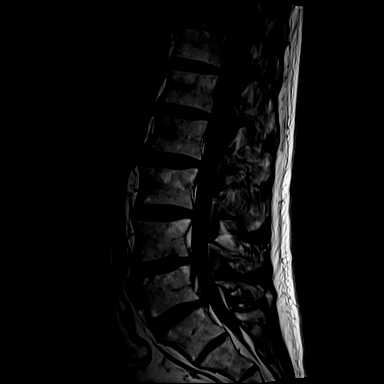
[im 15/15]
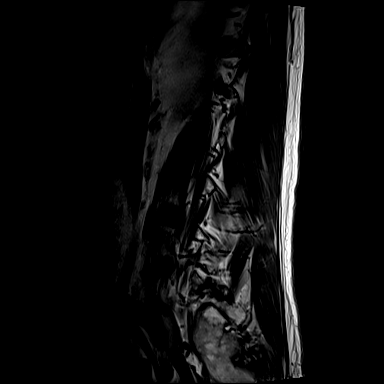

[Series 10: T1 · axial · 4.0mm · 0.28mm/px · z∈[-78,+99]mm · 3 of 42 slices shown (2 of 2)]
[im 6/42]
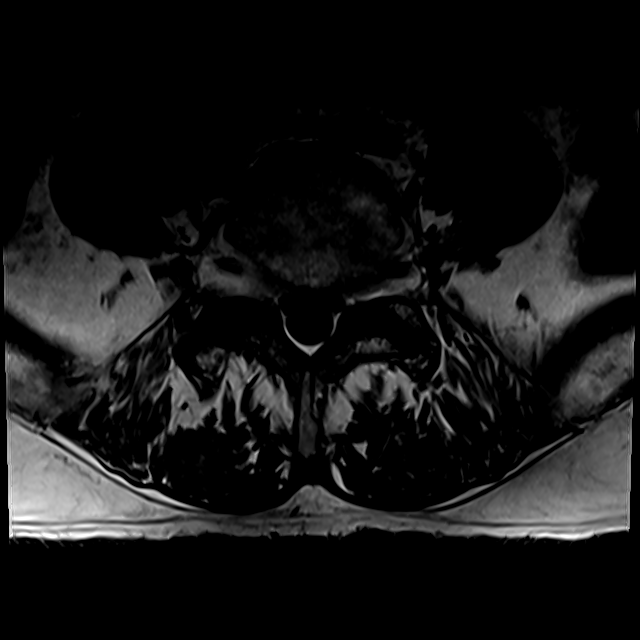
[im 22/42]
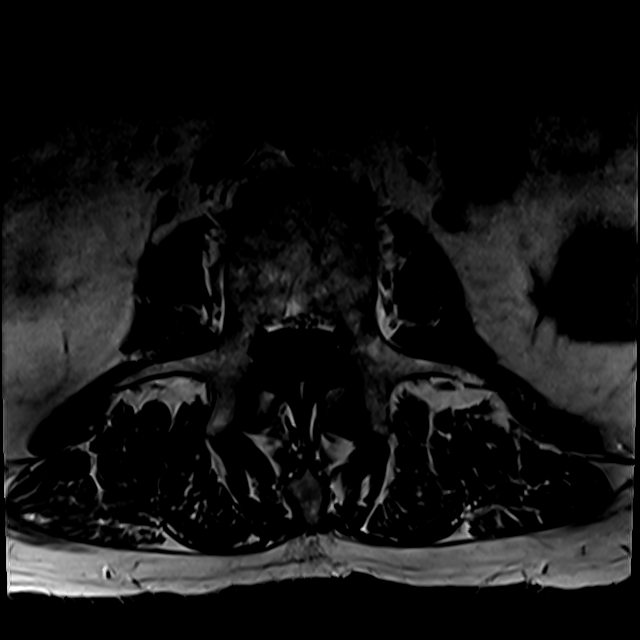
[im 36/42]
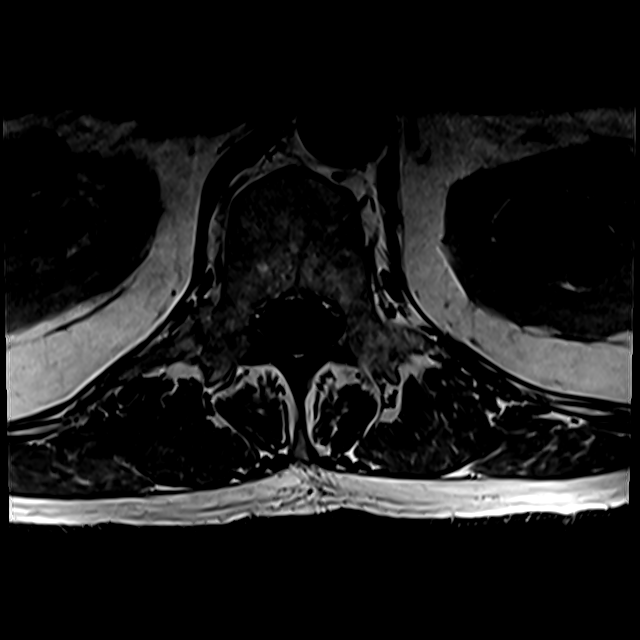

[Series 13: T2 · axial · 4.0mm · 0.28mm/px · z∈[-93,+99]mm · 7 of 42 slices shown (2 of 2)]
[im 3/42]
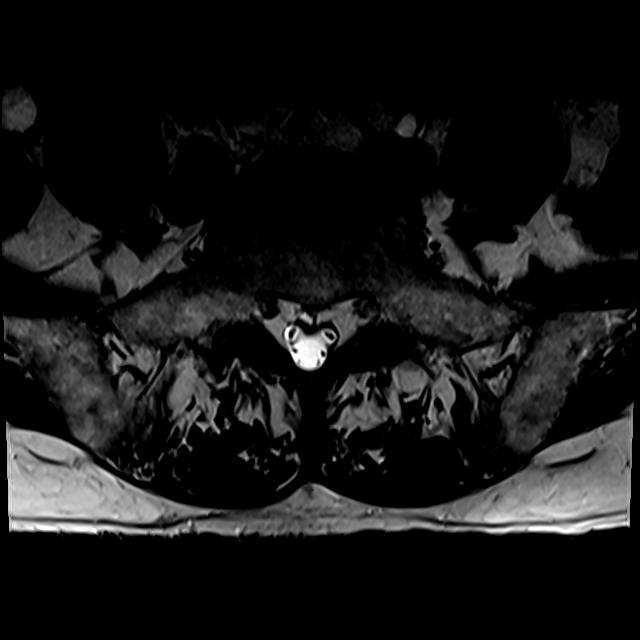
[im 6/42]
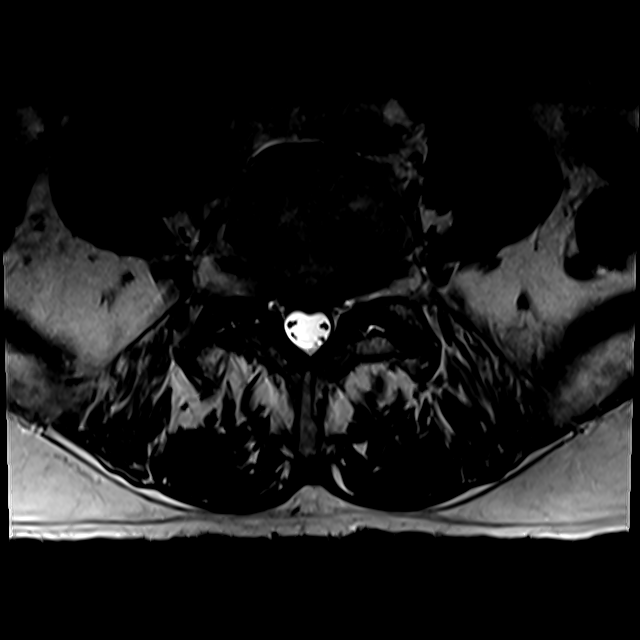
[im 9/42]
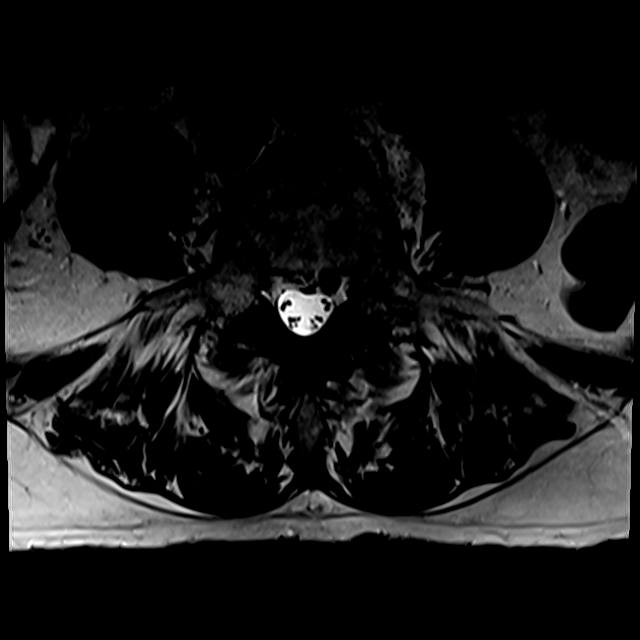
[im 14/42]
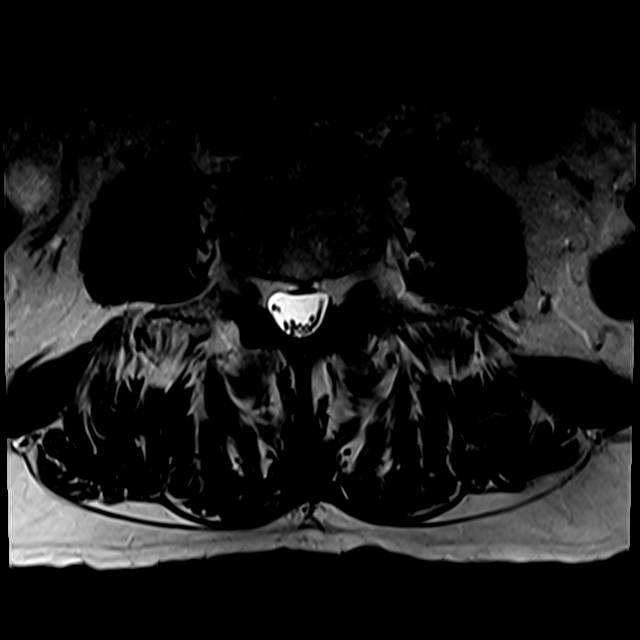
[im 20/42]
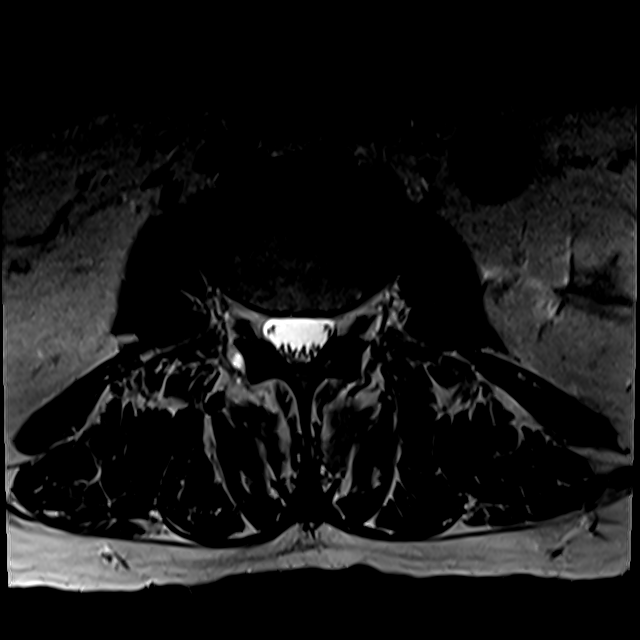
[im 22/42]
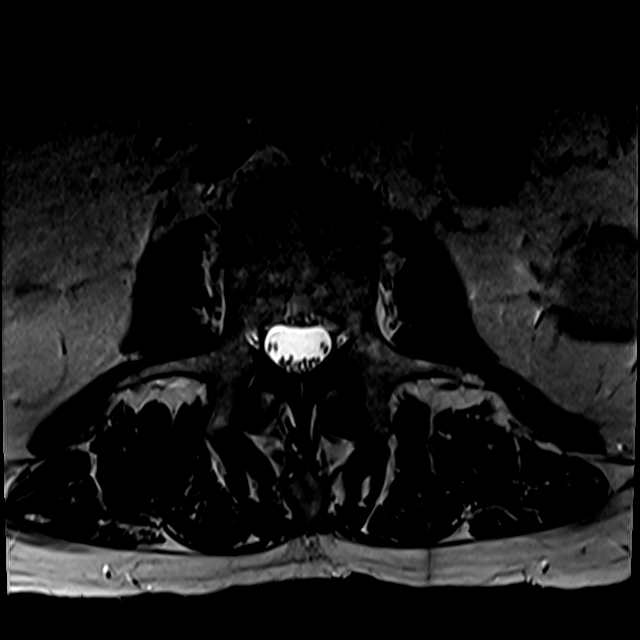
[im 36/42]
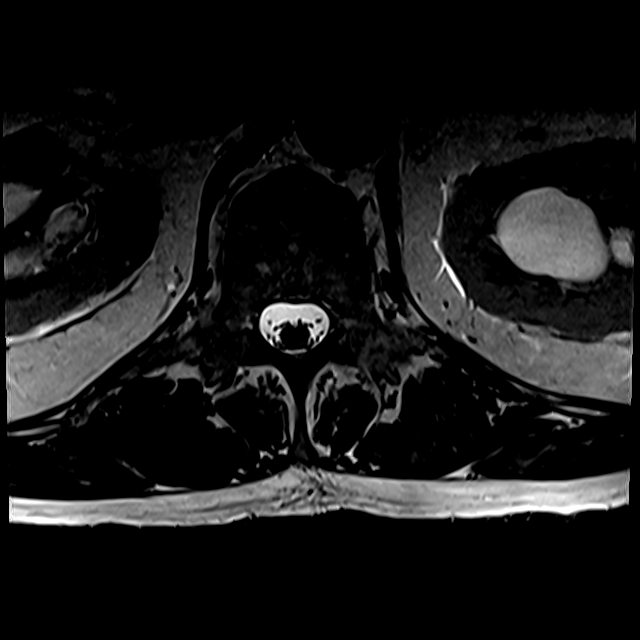

[18 of 48 positions shown; findings below may reference images not displayed]

FINDINGS: Segmentation:  5 lumbar type vertebral bodies.

Alignment:  2 mm anterolisthesis L4-5 and L5-S1.

Vertebrae:  No fracture or primary bone lesion.

Conus medullaris and cauda equina: Conus extends to the L1-2 level.
Conus and cauda equina appear normal.

Paraspinal and other soft tissues: Moderate hydronephrosis on the
left in this patient with a history of renal stone disease. Mild
fullness of the right renal collecting system.

Disc levels:

No abnormality at L2-3 or above.

L3-4: Minimal disc bulge.  No stenosis or neural compression.

L4-5: Broad-based disc herniation more prominent towards the left
with caudal migration of disc material down behind L5 to the left of
midline. Facet and ligamentous hypertrophy with mild edema. Stenosis
of both subarticular lateral recesses left worse than right.

L5-S1: Broad-based disc herniation more prominent towards the left.
Facet degeneration and hypertrophy with mild edema. Narrowing of the
subarticular lateral recesses with some potential to affect the S1
nerves. Foraminal narrowing of a mild degree on the left.
IMPRESSION: L4-5: Broad-based disc herniation more prominent towards the left
with a caudally migrated fragment to the left of midline, likely to
compress the left L5 nerve. Edematous facet arthropathy that could
contribute to low back pain.

L5-S1: Shallow disc herniation more prominent towards the left.
Narrowing of the subarticular lateral recesses left more than right.
Edematous facet arthropathy that could contribute to low back pain.

## 2018-10-20 DIAGNOSIS — I10 Essential (primary) hypertension: Secondary | ICD-10-CM | POA: Diagnosis not present

## 2018-10-20 DIAGNOSIS — E1121 Type 2 diabetes mellitus with diabetic nephropathy: Secondary | ICD-10-CM | POA: Diagnosis not present

## 2018-10-20 DIAGNOSIS — E7800 Pure hypercholesterolemia, unspecified: Secondary | ICD-10-CM | POA: Diagnosis not present

## 2018-10-20 DIAGNOSIS — D509 Iron deficiency anemia, unspecified: Secondary | ICD-10-CM | POA: Diagnosis not present

## 2018-11-15 DIAGNOSIS — D509 Iron deficiency anemia, unspecified: Secondary | ICD-10-CM | POA: Diagnosis not present

## 2018-11-15 DIAGNOSIS — E1121 Type 2 diabetes mellitus with diabetic nephropathy: Secondary | ICD-10-CM | POA: Diagnosis not present

## 2018-11-15 DIAGNOSIS — I10 Essential (primary) hypertension: Secondary | ICD-10-CM | POA: Diagnosis not present

## 2018-11-15 DIAGNOSIS — E7800 Pure hypercholesterolemia, unspecified: Secondary | ICD-10-CM | POA: Diagnosis not present

## 2018-12-07 DIAGNOSIS — Z794 Long term (current) use of insulin: Secondary | ICD-10-CM | POA: Diagnosis not present

## 2018-12-07 DIAGNOSIS — E1121 Type 2 diabetes mellitus with diabetic nephropathy: Secondary | ICD-10-CM | POA: Diagnosis not present

## 2018-12-07 DIAGNOSIS — I10 Essential (primary) hypertension: Secondary | ICD-10-CM | POA: Diagnosis not present

## 2018-12-15 DIAGNOSIS — E1121 Type 2 diabetes mellitus with diabetic nephropathy: Secondary | ICD-10-CM | POA: Diagnosis not present

## 2018-12-15 DIAGNOSIS — I10 Essential (primary) hypertension: Secondary | ICD-10-CM | POA: Diagnosis not present

## 2018-12-15 DIAGNOSIS — D509 Iron deficiency anemia, unspecified: Secondary | ICD-10-CM | POA: Diagnosis not present

## 2018-12-15 DIAGNOSIS — E7800 Pure hypercholesterolemia, unspecified: Secondary | ICD-10-CM | POA: Diagnosis not present

## 2019-01-18 DIAGNOSIS — H1045 Other chronic allergic conjunctivitis: Secondary | ICD-10-CM | POA: Diagnosis not present

## 2019-01-18 DIAGNOSIS — H2513 Age-related nuclear cataract, bilateral: Secondary | ICD-10-CM | POA: Diagnosis not present

## 2019-01-18 DIAGNOSIS — H40013 Open angle with borderline findings, low risk, bilateral: Secondary | ICD-10-CM | POA: Diagnosis not present

## 2019-01-18 DIAGNOSIS — E119 Type 2 diabetes mellitus without complications: Secondary | ICD-10-CM | POA: Diagnosis not present

## 2019-01-18 DIAGNOSIS — H04123 Dry eye syndrome of bilateral lacrimal glands: Secondary | ICD-10-CM | POA: Diagnosis not present

## 2019-02-01 DIAGNOSIS — E1121 Type 2 diabetes mellitus with diabetic nephropathy: Secondary | ICD-10-CM | POA: Diagnosis not present

## 2019-02-01 DIAGNOSIS — E7800 Pure hypercholesterolemia, unspecified: Secondary | ICD-10-CM | POA: Diagnosis not present

## 2019-02-01 DIAGNOSIS — Z794 Long term (current) use of insulin: Secondary | ICD-10-CM | POA: Diagnosis not present

## 2019-02-01 DIAGNOSIS — D509 Iron deficiency anemia, unspecified: Secondary | ICD-10-CM | POA: Diagnosis not present

## 2019-02-01 DIAGNOSIS — I10 Essential (primary) hypertension: Secondary | ICD-10-CM | POA: Diagnosis not present

## 2019-02-09 DIAGNOSIS — D2261 Melanocytic nevi of right upper limb, including shoulder: Secondary | ICD-10-CM | POA: Diagnosis not present

## 2019-02-09 DIAGNOSIS — D0461 Carcinoma in situ of skin of right upper limb, including shoulder: Secondary | ICD-10-CM | POA: Diagnosis not present

## 2019-02-09 DIAGNOSIS — D2262 Melanocytic nevi of left upper limb, including shoulder: Secondary | ICD-10-CM | POA: Diagnosis not present

## 2019-02-09 DIAGNOSIS — L821 Other seborrheic keratosis: Secondary | ICD-10-CM | POA: Diagnosis not present

## 2019-02-09 DIAGNOSIS — Z85828 Personal history of other malignant neoplasm of skin: Secondary | ICD-10-CM | POA: Diagnosis not present

## 2019-02-09 DIAGNOSIS — D225 Melanocytic nevi of trunk: Secondary | ICD-10-CM | POA: Diagnosis not present

## 2019-02-09 DIAGNOSIS — D1801 Hemangioma of skin and subcutaneous tissue: Secondary | ICD-10-CM | POA: Diagnosis not present

## 2019-02-09 DIAGNOSIS — D692 Other nonthrombocytopenic purpura: Secondary | ICD-10-CM | POA: Diagnosis not present

## 2019-02-09 DIAGNOSIS — C44612 Basal cell carcinoma of skin of right upper limb, including shoulder: Secondary | ICD-10-CM | POA: Diagnosis not present

## 2019-02-23 DIAGNOSIS — E1121 Type 2 diabetes mellitus with diabetic nephropathy: Secondary | ICD-10-CM | POA: Diagnosis not present

## 2019-02-23 DIAGNOSIS — D509 Iron deficiency anemia, unspecified: Secondary | ICD-10-CM | POA: Diagnosis not present

## 2019-02-23 DIAGNOSIS — Z79899 Other long term (current) drug therapy: Secondary | ICD-10-CM | POA: Diagnosis not present

## 2019-02-23 DIAGNOSIS — I10 Essential (primary) hypertension: Secondary | ICD-10-CM | POA: Diagnosis not present

## 2019-02-23 DIAGNOSIS — E7800 Pure hypercholesterolemia, unspecified: Secondary | ICD-10-CM | POA: Diagnosis not present

## 2019-03-06 DIAGNOSIS — E7800 Pure hypercholesterolemia, unspecified: Secondary | ICD-10-CM | POA: Diagnosis not present

## 2019-03-06 DIAGNOSIS — D509 Iron deficiency anemia, unspecified: Secondary | ICD-10-CM | POA: Diagnosis not present

## 2019-03-06 DIAGNOSIS — I10 Essential (primary) hypertension: Secondary | ICD-10-CM | POA: Diagnosis not present

## 2019-03-06 DIAGNOSIS — E1121 Type 2 diabetes mellitus with diabetic nephropathy: Secondary | ICD-10-CM | POA: Diagnosis not present

## 2019-03-28 DIAGNOSIS — Z23 Encounter for immunization: Secondary | ICD-10-CM | POA: Diagnosis not present

## 2019-05-22 DIAGNOSIS — Z7289 Other problems related to lifestyle: Secondary | ICD-10-CM | POA: Diagnosis not present

## 2019-05-22 DIAGNOSIS — H93293 Other abnormal auditory perceptions, bilateral: Secondary | ICD-10-CM | POA: Diagnosis not present

## 2019-05-22 DIAGNOSIS — H938X3 Other specified disorders of ear, bilateral: Secondary | ICD-10-CM | POA: Diagnosis not present

## 2019-05-22 DIAGNOSIS — H6123 Impacted cerumen, bilateral: Secondary | ICD-10-CM | POA: Diagnosis not present

## 2019-05-24 DIAGNOSIS — H903 Sensorineural hearing loss, bilateral: Secondary | ICD-10-CM | POA: Diagnosis not present

## 2019-07-26 DIAGNOSIS — Z23 Encounter for immunization: Secondary | ICD-10-CM | POA: Diagnosis not present

## 2019-08-07 DIAGNOSIS — Z85828 Personal history of other malignant neoplasm of skin: Secondary | ICD-10-CM | POA: Diagnosis not present

## 2019-08-07 DIAGNOSIS — L570 Actinic keratosis: Secondary | ICD-10-CM | POA: Diagnosis not present

## 2019-08-07 DIAGNOSIS — L821 Other seborrheic keratosis: Secondary | ICD-10-CM | POA: Diagnosis not present

## 2019-08-07 DIAGNOSIS — D1801 Hemangioma of skin and subcutaneous tissue: Secondary | ICD-10-CM | POA: Diagnosis not present

## 2019-08-07 DIAGNOSIS — L738 Other specified follicular disorders: Secondary | ICD-10-CM | POA: Diagnosis not present

## 2019-08-07 DIAGNOSIS — D2239 Melanocytic nevi of other parts of face: Secondary | ICD-10-CM | POA: Diagnosis not present

## 2019-08-07 DIAGNOSIS — D485 Neoplasm of uncertain behavior of skin: Secondary | ICD-10-CM | POA: Diagnosis not present

## 2019-08-07 DIAGNOSIS — D225 Melanocytic nevi of trunk: Secondary | ICD-10-CM | POA: Diagnosis not present

## 2019-08-07 DIAGNOSIS — D2271 Melanocytic nevi of right lower limb, including hip: Secondary | ICD-10-CM | POA: Diagnosis not present

## 2019-08-07 DIAGNOSIS — D2261 Melanocytic nevi of right upper limb, including shoulder: Secondary | ICD-10-CM | POA: Diagnosis not present

## 2019-08-07 DIAGNOSIS — D2262 Melanocytic nevi of left upper limb, including shoulder: Secondary | ICD-10-CM | POA: Diagnosis not present

## 2019-08-23 DIAGNOSIS — Z23 Encounter for immunization: Secondary | ICD-10-CM | POA: Diagnosis not present

## 2019-09-01 DIAGNOSIS — Z79899 Other long term (current) drug therapy: Secondary | ICD-10-CM | POA: Diagnosis not present

## 2019-09-01 DIAGNOSIS — Z794 Long term (current) use of insulin: Secondary | ICD-10-CM | POA: Diagnosis not present

## 2019-09-01 DIAGNOSIS — I10 Essential (primary) hypertension: Secondary | ICD-10-CM | POA: Diagnosis not present

## 2019-09-01 DIAGNOSIS — Z1389 Encounter for screening for other disorder: Secondary | ICD-10-CM | POA: Diagnosis not present

## 2019-09-01 DIAGNOSIS — E1121 Type 2 diabetes mellitus with diabetic nephropathy: Secondary | ICD-10-CM | POA: Diagnosis not present

## 2019-09-01 DIAGNOSIS — E7800 Pure hypercholesterolemia, unspecified: Secondary | ICD-10-CM | POA: Diagnosis not present

## 2019-09-01 DIAGNOSIS — Z0000 Encounter for general adult medical examination without abnormal findings: Secondary | ICD-10-CM | POA: Diagnosis not present

## 2020-01-31 DIAGNOSIS — I10 Essential (primary) hypertension: Secondary | ICD-10-CM | POA: Diagnosis not present

## 2020-01-31 DIAGNOSIS — E1121 Type 2 diabetes mellitus with diabetic nephropathy: Secondary | ICD-10-CM | POA: Diagnosis not present

## 2020-01-31 DIAGNOSIS — R05 Cough: Secondary | ICD-10-CM | POA: Diagnosis not present

## 2020-01-31 DIAGNOSIS — Z20822 Contact with and (suspected) exposure to covid-19: Secondary | ICD-10-CM | POA: Diagnosis not present

## 2020-01-31 DIAGNOSIS — Z794 Long term (current) use of insulin: Secondary | ICD-10-CM | POA: Diagnosis not present

## 2020-02-05 DIAGNOSIS — L814 Other melanin hyperpigmentation: Secondary | ICD-10-CM | POA: Diagnosis not present

## 2020-02-05 DIAGNOSIS — D2261 Melanocytic nevi of right upper limb, including shoulder: Secondary | ICD-10-CM | POA: Diagnosis not present

## 2020-02-05 DIAGNOSIS — D2372 Other benign neoplasm of skin of left lower limb, including hip: Secondary | ICD-10-CM | POA: Diagnosis not present

## 2020-02-05 DIAGNOSIS — D2272 Melanocytic nevi of left lower limb, including hip: Secondary | ICD-10-CM | POA: Diagnosis not present

## 2020-02-05 DIAGNOSIS — D2271 Melanocytic nevi of right lower limb, including hip: Secondary | ICD-10-CM | POA: Diagnosis not present

## 2020-02-05 DIAGNOSIS — Z85828 Personal history of other malignant neoplasm of skin: Secondary | ICD-10-CM | POA: Diagnosis not present

## 2020-02-05 DIAGNOSIS — D225 Melanocytic nevi of trunk: Secondary | ICD-10-CM | POA: Diagnosis not present

## 2020-02-05 DIAGNOSIS — D1801 Hemangioma of skin and subcutaneous tissue: Secondary | ICD-10-CM | POA: Diagnosis not present

## 2020-02-05 DIAGNOSIS — L821 Other seborrheic keratosis: Secondary | ICD-10-CM | POA: Diagnosis not present

## 2020-02-05 DIAGNOSIS — L570 Actinic keratosis: Secondary | ICD-10-CM | POA: Diagnosis not present

## 2020-02-05 DIAGNOSIS — D2262 Melanocytic nevi of left upper limb, including shoulder: Secondary | ICD-10-CM | POA: Diagnosis not present

## 2020-03-12 DIAGNOSIS — H6122 Impacted cerumen, left ear: Secondary | ICD-10-CM | POA: Diagnosis not present

## 2020-03-12 DIAGNOSIS — H9012 Conductive hearing loss, unilateral, left ear, with unrestricted hearing on the contralateral side: Secondary | ICD-10-CM | POA: Diagnosis not present

## 2020-03-12 DIAGNOSIS — E1121 Type 2 diabetes mellitus with diabetic nephropathy: Secondary | ICD-10-CM | POA: Diagnosis not present

## 2020-03-12 DIAGNOSIS — I10 Essential (primary) hypertension: Secondary | ICD-10-CM | POA: Diagnosis not present

## 2020-03-22 DIAGNOSIS — R300 Dysuria: Secondary | ICD-10-CM | POA: Diagnosis not present

## 2020-03-22 DIAGNOSIS — M545 Low back pain: Secondary | ICD-10-CM | POA: Diagnosis not present

## 2020-04-11 DIAGNOSIS — E1121 Type 2 diabetes mellitus with diabetic nephropathy: Secondary | ICD-10-CM | POA: Diagnosis not present

## 2020-04-11 DIAGNOSIS — I10 Essential (primary) hypertension: Secondary | ICD-10-CM | POA: Diagnosis not present

## 2020-04-23 DIAGNOSIS — I10 Essential (primary) hypertension: Secondary | ICD-10-CM | POA: Diagnosis not present

## 2020-04-23 DIAGNOSIS — M545 Low back pain, unspecified: Secondary | ICD-10-CM | POA: Diagnosis not present

## 2020-04-26 DIAGNOSIS — M545 Low back pain, unspecified: Secondary | ICD-10-CM | POA: Diagnosis not present

## 2020-04-26 DIAGNOSIS — M4696 Unspecified inflammatory spondylopathy, lumbar region: Secondary | ICD-10-CM | POA: Diagnosis not present

## 2020-07-11 DIAGNOSIS — M7918 Myalgia, other site: Secondary | ICD-10-CM | POA: Diagnosis not present

## 2020-07-11 DIAGNOSIS — S39012A Strain of muscle, fascia and tendon of lower back, initial encounter: Secondary | ICD-10-CM | POA: Diagnosis not present

## 2020-10-23 ENCOUNTER — Ambulatory Visit: Payer: Medicare Other | Attending: Internal Medicine

## 2020-10-23 ENCOUNTER — Other Ambulatory Visit: Payer: Self-pay

## 2020-10-23 DIAGNOSIS — Z23 Encounter for immunization: Secondary | ICD-10-CM

## 2020-10-23 NOTE — Progress Notes (Signed)
   Covid-19 Vaccination Clinic  Name:  DEMETRIOUS RAINFORD    MRN: [id number] DOB: 1941-06-07  10/23/2020  Mr. Shutes was observed post Covid-19 immunization for 15 minutes without incident. He was provided with Vaccine Information Sheet and instruction to access the V-Safe system.   Mr. Leanos was instructed to call 911 with any severe reactions post vaccine: Marland Kitchen Difficulty breathing  . Swelling of face and throat  . A fast heartbeat  . A bad rash all over body  . Dizziness and weakness   Immunizations Administered    Name Date Dose VIS Date Route   Moderna Covid-19 Booster Vaccine 10/23/2020 11:31 AM 0.25 mL 05/01/2020 Intramuscular   Manufacturer: Moderna   Lot: [id number]   [place]: [id number]

## 2020-10-28 ENCOUNTER — Other Ambulatory Visit (HOSPITAL_BASED_OUTPATIENT_CLINIC_OR_DEPARTMENT_OTHER): Payer: Self-pay

## 2020-10-28 MED ORDER — MODERNA COVID-19 VACCINE 100 MCG/0.5ML IM SUSP
INTRAMUSCULAR | 0 refills | Status: DC
  Filled 2020-10-28: qty 0.25, 1d supply, fill #0

## 2021-03-18 ENCOUNTER — Other Ambulatory Visit (HOSPITAL_BASED_OUTPATIENT_CLINIC_OR_DEPARTMENT_OTHER): Payer: Self-pay

## 2021-03-18 MED ORDER — LAGEVRIO 200 MG PO CAPS
ORAL_CAPSULE | ORAL | 0 refills | Status: DC
  Filled 2021-03-18: qty 40, 5d supply, fill #0

## 2021-04-24 ENCOUNTER — Other Ambulatory Visit (HOSPITAL_BASED_OUTPATIENT_CLINIC_OR_DEPARTMENT_OTHER): Payer: Self-pay

## 2021-04-24 ENCOUNTER — Ambulatory Visit: Payer: Medicare Other

## 2021-04-24 MED ORDER — INFLUENZA VAC A&B SA ADJ QUAD 0.5 ML IM PRSY
PREFILLED_SYRINGE | INTRAMUSCULAR | 0 refills | Status: DC
  Filled 2021-04-24 – 2021-05-13 (×2): qty 0.5, 1d supply, fill #0

## 2021-05-13 ENCOUNTER — Other Ambulatory Visit (HOSPITAL_BASED_OUTPATIENT_CLINIC_OR_DEPARTMENT_OTHER): Payer: Self-pay

## 2021-05-19 ENCOUNTER — Other Ambulatory Visit (HOSPITAL_BASED_OUTPATIENT_CLINIC_OR_DEPARTMENT_OTHER): Payer: Self-pay

## 2021-05-19 ENCOUNTER — Ambulatory Visit: Payer: Medicare Other | Attending: Internal Medicine

## 2021-05-19 ENCOUNTER — Ambulatory Visit: Payer: Medicare Other

## 2021-05-19 ENCOUNTER — Other Ambulatory Visit: Payer: Self-pay

## 2021-05-19 DIAGNOSIS — Z23 Encounter for immunization: Secondary | ICD-10-CM

## 2021-05-19 MED ORDER — MODERNA COVID-19 BIVAL BOOSTER 50 MCG/0.5ML IM SUSP
INTRAMUSCULAR | 0 refills | Status: DC
  Filled 2021-05-19: qty 0.5, 1d supply, fill #0

## 2021-05-19 NOTE — Progress Notes (Signed)
   Covid-19 Vaccination Clinic  Name:  TYREASE VANDEBERG    MRN: [id number] DOB: [birth year]  05/19/2021  Mr. Arns was observed post Covid-19 immunization for 15 minutes without incident. He was provided with Vaccine Information Sheet and instruction to access the V-Safe system.   Mr. Eisenbeis was instructed to call 911 with any severe reactions post vaccine: Difficulty breathing  Swelling of face and throat  A fast heartbeat  A bad rash all over body  Dizziness and weakness   Immunizations Administered     Name Date Dose VIS Date Route   Moderna Covid-19 vaccine Bivalent Booster 05/19/2021  2:53 PM 0.5 mL 02/22/2021 Intramuscular   Manufacturer: Moderna   Lot: [id number]   [place]: [phone]

## 2021-06-18 ENCOUNTER — Other Ambulatory Visit (HOSPITAL_BASED_OUTPATIENT_CLINIC_OR_DEPARTMENT_OTHER): Payer: Self-pay

## 2021-06-18 MED ORDER — LAGEVRIO 200 MG PO CAPS
ORAL_CAPSULE | ORAL | 0 refills | Status: DC
  Filled 2021-06-18: qty 40, 5d supply, fill #0

## 2022-03-05 ENCOUNTER — Other Ambulatory Visit (HOSPITAL_BASED_OUTPATIENT_CLINIC_OR_DEPARTMENT_OTHER): Payer: Self-pay

## 2022-03-05 MED ORDER — DOXYCYCLINE HYCLATE 100 MG PO CAPS
100.0000 mg | ORAL_CAPSULE | Freq: Two times a day (BID) | ORAL | 0 refills | Status: DC
  Filled 2022-03-05: qty 10, 5d supply, fill #0

## 2022-03-17 ENCOUNTER — Other Ambulatory Visit (HOSPITAL_BASED_OUTPATIENT_CLINIC_OR_DEPARTMENT_OTHER): Payer: Self-pay

## 2022-03-27 ENCOUNTER — Other Ambulatory Visit (HOSPITAL_BASED_OUTPATIENT_CLINIC_OR_DEPARTMENT_OTHER): Payer: Self-pay

## 2022-03-27 MED ORDER — KETOCONAZOLE 2 % EX CREA
TOPICAL_CREAM | CUTANEOUS | 0 refills | Status: DC
  Filled 2022-03-27: qty 30, 14d supply, fill #0

## 2022-04-07 ENCOUNTER — Other Ambulatory Visit (HOSPITAL_BASED_OUTPATIENT_CLINIC_OR_DEPARTMENT_OTHER): Payer: Self-pay

## 2022-04-07 MED ORDER — KETOCONAZOLE 2 % EX CREA
TOPICAL_CREAM | CUTANEOUS | 2 refills | Status: DC
  Filled 2022-04-07: qty 30, 14d supply, fill #0
  Filled 2022-08-25: qty 30, 14d supply, fill #1

## 2022-04-08 ENCOUNTER — Other Ambulatory Visit (HOSPITAL_BASED_OUTPATIENT_CLINIC_OR_DEPARTMENT_OTHER): Payer: Self-pay

## 2022-04-08 MED ORDER — FLUCONAZOLE 100 MG PO TABS
100.0000 mg | ORAL_TABLET | Freq: Every day | ORAL | 0 refills | Status: DC
  Filled 2022-04-08: qty 7, 7d supply, fill #0

## 2022-04-14 ENCOUNTER — Other Ambulatory Visit (HOSPITAL_BASED_OUTPATIENT_CLINIC_OR_DEPARTMENT_OTHER): Payer: Self-pay

## 2022-04-14 MED ORDER — FLUAD QUADRIVALENT 0.5 ML IM PRSY
PREFILLED_SYRINGE | INTRAMUSCULAR | 0 refills | Status: DC
  Filled 2022-04-14: qty 0.5, 1d supply, fill #0

## 2022-04-17 ENCOUNTER — Other Ambulatory Visit (HOSPITAL_BASED_OUTPATIENT_CLINIC_OR_DEPARTMENT_OTHER): Payer: Self-pay

## 2022-04-17 MED ORDER — AMOXICILLIN-POT CLAVULANATE 875-125 MG PO TABS
1.0000 | ORAL_TABLET | Freq: Two times a day (BID) | ORAL | 0 refills | Status: DC
  Filled 2022-04-17: qty 20, 10d supply, fill #0

## 2022-04-17 MED ORDER — PREDNISONE 5 MG PO TABS
ORAL_TABLET | ORAL | 0 refills | Status: DC
  Filled 2022-04-17: qty 21, 6d supply, fill #0

## 2022-04-29 ENCOUNTER — Other Ambulatory Visit (HOSPITAL_BASED_OUTPATIENT_CLINIC_OR_DEPARTMENT_OTHER): Payer: Self-pay

## 2022-04-29 MED ORDER — AMOXICILLIN 500 MG PO CAPS
500.0000 mg | ORAL_CAPSULE | ORAL | 0 refills | Status: DC
  Filled 2022-04-29: qty 25, 6d supply, fill #0

## 2022-06-19 ENCOUNTER — Other Ambulatory Visit (HOSPITAL_BASED_OUTPATIENT_CLINIC_OR_DEPARTMENT_OTHER): Payer: Self-pay

## 2022-06-19 MED ORDER — AREXVY 120 MCG/0.5ML IM SUSR
INTRAMUSCULAR | 0 refills | Status: DC
  Filled 2022-06-19: qty 0.5, 1d supply, fill #0

## 2022-08-07 ENCOUNTER — Other Ambulatory Visit (HOSPITAL_BASED_OUTPATIENT_CLINIC_OR_DEPARTMENT_OTHER): Payer: Self-pay

## 2022-08-07 MED ORDER — COMIRNATY 30 MCG/0.3ML IM SUSY
PREFILLED_SYRINGE | INTRAMUSCULAR | 0 refills | Status: DC
  Filled 2022-08-07: qty 0.3, 1d supply, fill #0

## 2022-08-19 ENCOUNTER — Other Ambulatory Visit: Payer: Self-pay | Admitting: Internal Medicine

## 2022-08-19 ENCOUNTER — Ambulatory Visit
Admission: RE | Admit: 2022-08-19 | Discharge: 2022-08-19 | Disposition: A | Discharge status: Home / Self Care | Payer: Medicare Other | Source: Ambulatory Visit | Attending: Internal Medicine | Admitting: Internal Medicine

## 2022-08-19 DIAGNOSIS — R0689 Other abnormalities of breathing: Secondary | ICD-10-CM

## 2022-08-25 ENCOUNTER — Other Ambulatory Visit (HOSPITAL_BASED_OUTPATIENT_CLINIC_OR_DEPARTMENT_OTHER): Payer: Self-pay

## 2022-10-30 ENCOUNTER — Emergency Department (HOSPITAL_BASED_OUTPATIENT_CLINIC_OR_DEPARTMENT_OTHER)
Admission: EM | Admit: 2022-10-30 | Discharge: 2022-10-30 | Disposition: A | Discharge status: Home / Self Care | Payer: Medicare Other | Attending: Emergency Medicine | Admitting: Emergency Medicine

## 2022-10-30 ENCOUNTER — Encounter (HOSPITAL_BASED_OUTPATIENT_CLINIC_OR_DEPARTMENT_OTHER): Payer: Self-pay | Admitting: Emergency Medicine

## 2022-10-30 ENCOUNTER — Other Ambulatory Visit (HOSPITAL_BASED_OUTPATIENT_CLINIC_OR_DEPARTMENT_OTHER): Payer: Self-pay

## 2022-10-30 ENCOUNTER — Other Ambulatory Visit: Payer: Self-pay

## 2022-10-30 DIAGNOSIS — E11649 Type 2 diabetes mellitus with hypoglycemia without coma: Secondary | ICD-10-CM | POA: Insufficient documentation

## 2022-10-30 DIAGNOSIS — D649 Anemia, unspecified: Secondary | ICD-10-CM | POA: Insufficient documentation

## 2022-10-30 DIAGNOSIS — Z7984 Long term (current) use of oral hypoglycemic drugs: Secondary | ICD-10-CM | POA: Insufficient documentation

## 2022-10-30 DIAGNOSIS — E162 Hypoglycemia, unspecified: Secondary | ICD-10-CM | POA: Diagnosis present

## 2022-10-30 DIAGNOSIS — T39395A Adverse effect of other nonsteroidal anti-inflammatory drugs [NSAID], initial encounter: Secondary | ICD-10-CM | POA: Diagnosis not present

## 2022-10-30 DIAGNOSIS — T50905A Adverse effect of unspecified drugs, medicaments and biological substances, initial encounter: Secondary | ICD-10-CM

## 2022-10-30 LAB — URINALYSIS, ROUTINE W REFLEX MICROSCOPIC
Bacteria, UA: NONE SEEN
Bilirubin Urine: NEGATIVE
Glucose, UA: 500 mg/dL — AB
Hgb urine dipstick: NEGATIVE
Ketones, ur: NEGATIVE mg/dL
Leukocytes,Ua: NEGATIVE
Nitrite: NEGATIVE
Protein, ur: NEGATIVE mg/dL
Specific Gravity, Urine: 1.023 (ref 1.005–1.030)
pH: 5.5 (ref 5.0–8.0)

## 2022-10-30 LAB — BASIC METABOLIC PANEL
Anion gap: 11 (ref 5–15)
BUN: 19 mg/dL (ref 8–23)
CO2: 24 mmol/L (ref 22–32)
Calcium: 9 mg/dL (ref 8.9–10.3)
Chloride: 104 mmol/L (ref 98–111)
Creatinine, Ser: 0.91 mg/dL (ref 0.61–1.24)
GFR, Estimated: >60 mL/min (ref >60)
Glucose, Bld: 79 mg/dL (ref 70–99)
Potassium: 3.8 mmol/L (ref 3.5–5.1)
Sodium: 139 mmol/L (ref 135–145)

## 2022-10-30 LAB — CBC WITH DIFFERENTIAL/PLATELET
Abs Immature Granulocytes: 0.02 10*3/uL (ref 0.00–0.07)
Basophils Absolute: 0 10*3/uL (ref 0.0–0.1)
Basophils Relative: 1 %
Eosinophils Absolute: 0.5 10*3/uL (ref 0.0–0.5)
Eosinophils Relative: 8 %
HCT: 35.9 % — ABNORMAL LOW (ref 39.0–52.0)
Hemoglobin: 11.4 g/dL — ABNORMAL LOW (ref 13.0–17.0)
Immature Granulocytes: 0 %
Lymphocytes Relative: 21 %
Lymphs Abs: 1.2 10*3/uL (ref 0.7–4.0)
MCH: 26.5 pg (ref 26.0–34.0)
MCHC: 31.8 g/dL (ref 30.0–36.0)
MCV: 83.3 fL (ref 80.0–100.0)
Monocytes Absolute: 0.5 10*3/uL (ref 0.1–1.0)
Monocytes Relative: 9 %
Neutro Abs: 3.6 10*3/uL (ref 1.7–7.7)
Neutrophils Relative %: 61 %
Platelets: 206 10*3/uL (ref 150–400)
RBC: 4.31 MIL/uL (ref 4.22–5.81)
RDW: 14 % (ref 11.5–15.5)
WBC: 5.8 10*3/uL (ref 4.0–10.5)
nRBC: 0 % (ref 0.0–0.2)

## 2022-10-30 LAB — CBG MONITORING, ED
Glucose-Capillary: 79 mg/dL (ref 70–99)
Glucose-Capillary: 75 mg/dL (ref 70–99)

## 2022-10-30 NOTE — ED Triage Notes (Signed)
Presents for hypoglycemia (62mL/dl but concerned may continue to drop), feeling "goofy" and dizziness.  H/o DM

## 2022-10-30 NOTE — ED Notes (Signed)
UA sent to lab along with urine culture.

## 2022-10-30 NOTE — ED Provider Notes (Signed)
[place] EMERGENCY DEPARTMENT AT [facility]  Provider Note  CSN: [id number] Arrival date & time: 10/30/22 4098  History Chief Complaint  Patient presents with   Hypoglycemia    Samuel Ramos is a 82 y.o. male with history of DM brought to the ED by his wife for concerns of low glucose. He woke up around 5am and states he felt strange. He checked his glucose and it was 71 at home. He has been told by his doctors not to let it get below 70 so he began to worry it might get lower if he went back to sleep. His wife reports he was 'acting goofy' but no specific behaviors were described. He noted that he ran out of his Toujeo yesterday and did not take his usual evening dose. He has also been taking Advil PM to help him sleep.    Home Medications Prior to Admission medications   Medication Sig Start Date End Date Taking? Authorizing Provider  bisacodyl (DULCOLAX) 5 MG EC tablet Take 5 mg by mouth daily.    [provider]  ferrous sulfate 325 (65 FE) MG EC tablet Take 325 mg by mouth 2 (two) times daily.    [provider]  fluconazole (DIFLUCAN) 100 MG tablet Take 1 tablet (100 mg total) by mouth daily. 04/08/22     glimepiride (AMARYL) 4 MG tablet Take 4 mg by mouth 2 (two) times daily.    [provider]  ketoconazole (NIZORAL) 2 % cream Apply to affected area(s) twice daily for 14 days. 04/07/22     lisinopril (PRINIVIL,ZESTRIL) 5 MG tablet Take 5 mg by mouth every evening.    [provider]  Melatonin 10 MG TABS Take 10 mg by mouth at bedtime.    [provider]  metFORMIN (GLUCOPHAGE-XR) 500 MG 24 hr tablet Take 1,000 mg by mouth 2 (two) times daily.    [provider]  saccharomyces boulardii (FLORASTOR) 250 MG capsule Take 250 mg by mouth daily.    [provider]     Allergies    Januvia [sitagliptin], Gabapentin, and Vicodin [hydrocodone-acetaminophen]   Review of Systems   Review of Systems Please see  HPI for pertinent positives and negatives  Physical Exam BP 106/62   Pulse (!) 59   Temp 98 F (36.7 C) (Oral)   Resp 18   Wt 84.4 kg   SpO2 97%   BMI 23.88 kg/m   Physical Exam Vitals and nursing note reviewed.  Constitutional:      Appearance: Normal appearance.  HENT:     Head: Normocephalic and atraumatic.     Nose: Nose normal.     Mouth/Throat:     Mouth: Mucous membranes are moist.  Eyes:     Extraocular Movements: Extraocular movements intact.     Conjunctiva/sclera: Conjunctivae normal.  Cardiovascular:     Rate and Rhythm: Normal rate.  Pulmonary:     Effort: Pulmonary effort is normal.     Breath sounds: Normal breath sounds.  Abdominal:     General: Abdomen is flat.     Palpations: Abdomen is soft.     Tenderness: There is no abdominal tenderness.  Musculoskeletal:        General: No swelling. Normal range of motion.     Cervical back: Neck supple.  Skin:    General: Skin is warm and dry.  Neurological:     General: No focal deficit present.     Mental Status: He is  alert and oriented to person, place, and time.     Cranial Nerves: No cranial nerve deficit.     Sensory: No sensory deficit.     Motor: No weakness.  Psychiatric:        Mood and Affect: Mood normal.     ED Results / Procedures / Treatments   EKG EKG Interpretation  Date/Time:  Friday October 30 2022 05:28:43 EDT Ventricular Rate:  67 PR Interval:  186 QRS Duration: 149 QT Interval:  452 QTC Calculation: 478 R Axis:   -46 Text Interpretation: Sinus rhythm RBBB and LAFB Since last tracing RBBB and LAFB are new Confirmed by Susy Frizzle [phone]) on 10/30/2022 5:31:17 AM  Procedures Procedures  Medications Ordered in the ED Medications - No data to display  Initial Impression and Plan  Patient here with concerns of hypoglycemia with reports of not acting quite his normal self when he woke up around 5am. Glucose is not particularly low, but this is lower than his baseline of  90s in the AM so he may be feeling some symptoms from that. Also has been taking Advil PM x 2 at night around 2200hrs and may still be feeling the effects from the diphenhydramine. Will check labs to ensure no other acute condition is apparent and recheck glucose in 1 hour.   ED Course   Clinical Course as of 10/30/22 0706  Fri Oct 30, 2022  6962 CBC with mild anemia, similar to previous.  [CS]  0654 UA with moderate glucosuria, otherwise normal.  [CS]  0700 BMP is normal. Awaiting repeat CBG, if not significantly lower anticipate discharge home.  [CS]  0703 Repeat glucose is 75. Doubt this is causing his symptoms. I recommend he reduce the dose of the Advil PM and see if that helps him sleep without causing him any mental status changes. Continue Toujeo as prescribed. Otherwise he looks well and is comfortable going home.  [CS]    Clinical Course User Index [CS] Pollyann Savoy, MD     MDM Rules/Calculators/A&P Medical Decision Making Problems Addressed: Adverse drug effect, initial encounter: acute illness or injury  Amount and/or Complexity of Data Reviewed Labs: ordered. Decision-making details documented in ED Course.  Risk Prescription drug management.     Final Clinical Impression(s) / ED Diagnoses Final diagnoses:  Adverse drug effect, initial encounter    Rx / DC Orders ED Discharge Orders     None        Pollyann Savoy, MD 10/30/22 [phone]

## 2022-11-10 ENCOUNTER — Other Ambulatory Visit (HOSPITAL_BASED_OUTPATIENT_CLINIC_OR_DEPARTMENT_OTHER): Payer: Self-pay

## 2022-11-10 MED ORDER — AMOXICILLIN 500 MG PO CAPS
500.0000 mg | ORAL_CAPSULE | Freq: Three times a day (TID) | ORAL | 1 refills | Status: DC
  Filled 2022-11-10: qty 30, 10d supply, fill #0

## 2022-11-24 ENCOUNTER — Other Ambulatory Visit (HOSPITAL_BASED_OUTPATIENT_CLINIC_OR_DEPARTMENT_OTHER): Payer: Self-pay

## 2022-11-24 MED ORDER — TRAMADOL HCL 50 MG PO TABS
50.0000 mg | ORAL_TABLET | Freq: Four times a day (QID) | ORAL | 0 refills | Status: DC | PRN
  Filled 2022-11-24: qty 12, 3d supply, fill #0

## 2023-02-03 ENCOUNTER — Other Ambulatory Visit (HOSPITAL_BASED_OUTPATIENT_CLINIC_OR_DEPARTMENT_OTHER): Payer: Self-pay

## 2023-02-03 MED ORDER — ACYCLOVIR 5 % EX OINT
TOPICAL_OINTMENT | CUTANEOUS | 1 refills | Status: DC
  Filled 2023-02-03: qty 15, 4d supply, fill #0
  Filled 2023-02-09: qty 15, 4d supply, fill #1
  Filled 2023-03-09: qty 15, 4d supply, fill #2
  Filled 2023-03-09: qty 30, 8d supply, fill #2
  Filled 2023-05-14: qty 60, 30d supply, fill #3

## 2023-02-03 MED ORDER — VALACYCLOVIR HCL 500 MG PO TABS
500.0000 mg | ORAL_TABLET | Freq: Every day | ORAL | 3 refills | Status: AC | PRN
  Filled 2023-02-03: qty 5, 5d supply, fill #0
  Filled 2023-02-09: qty 5, 5d supply, fill #1
  Filled 2023-03-16 – 2023-08-20 (×2): qty 5, 5d supply, fill #2

## 2023-02-09 ENCOUNTER — Other Ambulatory Visit (HOSPITAL_BASED_OUTPATIENT_CLINIC_OR_DEPARTMENT_OTHER): Payer: Self-pay

## 2023-02-24 ENCOUNTER — Other Ambulatory Visit (HOSPITAL_BASED_OUTPATIENT_CLINIC_OR_DEPARTMENT_OTHER): Payer: Self-pay

## 2023-02-25 ENCOUNTER — Other Ambulatory Visit (HOSPITAL_BASED_OUTPATIENT_CLINIC_OR_DEPARTMENT_OTHER): Payer: Self-pay

## 2023-03-02 ENCOUNTER — Other Ambulatory Visit (HOSPITAL_BASED_OUTPATIENT_CLINIC_OR_DEPARTMENT_OTHER): Payer: Self-pay

## 2023-03-02 MED ORDER — EZETIMIBE 10 MG PO TABS
10.0000 mg | ORAL_TABLET | Freq: Every day | ORAL | 3 refills | Status: DC
  Filled 2023-03-02 – 2023-04-06 (×2): qty 90, 90d supply, fill #0
  Filled 2023-04-06 – 2023-04-08 (×2): qty 30, 30d supply, fill #0
  Filled 2023-05-03: qty 30, 30d supply, fill #1
  Filled 2023-05-26: qty 30, 30d supply, fill #2
  Filled 2023-06-24: qty 30, 30d supply, fill #3
  Filled 2023-07-12 – 2023-07-20 (×3): qty 30, 30d supply, fill #4
  Filled 2023-08-09 – 2023-08-12 (×2): qty 30, 30d supply, fill #5
  Filled 2023-09-06 – 2023-09-07 (×2): qty 30, 30d supply, fill #6
  Filled 2023-10-13 (×2): qty 30, 30d supply, fill #7
  Filled 2023-11-01 – 2023-11-18 (×3): qty 30, 30d supply, fill #8
  Filled 2023-12-03 – 2023-12-14 (×2): qty 30, 30d supply, fill #9
  Filled 2024-01-06 – 2024-02-28 (×2): qty 30, 30d supply, fill #10
  Filled ????-??-??: fill #10
  Filled ????-??-??: fill #3
  Filled ????-??-??: fill #4

## 2023-03-02 MED ORDER — METFORMIN HCL ER 500 MG PO TB24
1000.0000 mg | ORAL_TABLET | Freq: Two times a day (BID) | ORAL | 3 refills | Status: DC
  Filled 2023-03-02: qty 360, 90d supply, fill #0
  Filled 2023-04-06: qty 120, 30d supply, fill #0
  Filled 2023-04-06: qty 360, 90d supply, fill #0
  Filled 2023-04-08: qty 120, 30d supply, fill #0
  Filled 2023-05-03: qty 120, 30d supply, fill #1
  Filled 2023-05-26: qty 120, 30d supply, fill #2
  Filled 2023-06-24: qty 120, 30d supply, fill #3
  Filled 2023-07-12 – 2023-07-20 (×3): qty 120, 30d supply, fill #4
  Filled 2023-08-09 – 2023-08-12 (×2): qty 120, 30d supply, fill #5
  Filled 2023-09-06 – 2023-09-07 (×2): qty 120, 30d supply, fill #6
  Filled 2023-10-13 (×2): qty 120, 30d supply, fill #7
  Filled 2023-11-01 – 2023-11-18 (×3): qty 120, 30d supply, fill #8
  Filled 2023-12-03 – 2023-12-14 (×2): qty 120, 30d supply, fill #9
  Filled 2024-01-06 – 2024-02-28 (×2): qty 120, 30d supply, fill #10
  Filled ????-??-??: fill #3
  Filled ????-??-??: fill #4
  Filled ????-??-??: fill #10

## 2023-03-02 MED ORDER — DAPAGLIFLOZIN PROPANEDIOL 10 MG PO TABS
10.0000 mg | ORAL_TABLET | Freq: Every day | ORAL | 0 refills | Status: DC
  Filled 2023-03-02 – 2023-04-06 (×2): qty 90, 90d supply, fill #0
  Filled 2023-04-06 – 2023-04-08 (×2): qty 30, 30d supply, fill #0
  Filled 2023-05-03: qty 30, 30d supply, fill #1
  Filled 2023-05-26: qty 30, 30d supply, fill #2

## 2023-03-02 MED ORDER — TOUJEO SOLOSTAR 300 UNIT/ML ~~LOC~~ SOPN
21.0000 [IU] | PEN_INJECTOR | Freq: Every day | SUBCUTANEOUS | 1 refills | Status: DC
  Filled 2023-03-02: qty 4.5, 64d supply, fill #0
  Filled 2023-05-14: qty 4.5, 90d supply, fill #0
  Filled 2023-06-07 – 2023-12-16 (×2): qty 4.5, 90d supply, fill #1
  Filled 2023-12-16: qty 4.5, 64d supply, fill #1

## 2023-03-02 MED ORDER — REPATHA SURECLICK 140 MG/ML ~~LOC~~ SOAJ
140.0000 mg | SUBCUTANEOUS | 3 refills | Status: DC
  Filled 2023-03-02 – 2023-04-08 (×2): qty 2, 28d supply, fill #0
  Filled 2023-05-14: qty 2, 28d supply, fill #1
  Filled 2023-06-30: qty 2, 28d supply, fill #2
  Filled 2023-07-12 – 2023-09-06 (×3): qty 2, 28d supply, fill #3
  Filled ????-??-??: fill #3

## 2023-03-02 MED ORDER — GLIMEPIRIDE 4 MG PO TABS
4.0000 mg | ORAL_TABLET | Freq: Two times a day (BID) | ORAL | 2 refills | Status: DC
  Filled 2023-03-02 – 2023-04-06 (×2): qty 180, 90d supply, fill #0
  Filled 2023-04-06 – 2023-04-08 (×2): qty 60, 30d supply, fill #0
  Filled 2023-05-03: qty 60, 30d supply, fill #1
  Filled 2023-05-26: qty 60, 30d supply, fill #2
  Filled 2023-06-24: qty 60, 30d supply, fill #3
  Filled 2023-07-12 – 2023-07-20 (×3): qty 60, 30d supply, fill #4
  Filled 2023-08-09 – 2023-08-12 (×2): qty 60, 30d supply, fill #5
  Filled 2023-09-06 – 2023-09-07 (×2): qty 60, 30d supply, fill #6
  Filled 2023-10-13 (×2): qty 60, 30d supply, fill #7
  Filled 2023-11-01 – 2023-11-18 (×3): qty 60, 30d supply, fill #8
  Filled ????-??-??: fill #4
  Filled ????-??-??: fill #3

## 2023-03-04 ENCOUNTER — Other Ambulatory Visit (HOSPITAL_BASED_OUTPATIENT_CLINIC_OR_DEPARTMENT_OTHER): Payer: Self-pay

## 2023-03-08 ENCOUNTER — Other Ambulatory Visit (HOSPITAL_BASED_OUTPATIENT_CLINIC_OR_DEPARTMENT_OTHER): Payer: Self-pay

## 2023-03-08 MED ORDER — INSULIN GLARGINE (1 UNIT DIAL) 300 UNIT/ML ~~LOC~~ SOPN
21.0000 [IU] | PEN_INJECTOR | Freq: Every day | SUBCUTANEOUS | 1 refills | Status: DC
  Filled 2023-03-08: qty 4.5, 30d supply, fill #0
  Filled 2023-07-22: qty 4.5, 90d supply, fill #0
  Filled 2023-10-13: qty 4.5, 64d supply, fill #1

## 2023-03-09 ENCOUNTER — Other Ambulatory Visit (HOSPITAL_BASED_OUTPATIENT_CLINIC_OR_DEPARTMENT_OTHER): Payer: Self-pay

## 2023-03-16 ENCOUNTER — Other Ambulatory Visit (HOSPITAL_BASED_OUTPATIENT_CLINIC_OR_DEPARTMENT_OTHER): Payer: Self-pay

## 2023-03-17 ENCOUNTER — Other Ambulatory Visit (HOSPITAL_BASED_OUTPATIENT_CLINIC_OR_DEPARTMENT_OTHER): Payer: Self-pay

## 2023-03-17 MED ORDER — VALACYCLOVIR HCL 500 MG PO TABS
500.0000 mg | ORAL_TABLET | Freq: Every day | ORAL | 3 refills | Status: DC | PRN
  Filled 2023-03-17: qty 30, 30d supply, fill #0
  Filled 2023-05-21: qty 30, 30d supply, fill #1
  Filled 2023-07-13: qty 30, 30d supply, fill #2
  Filled 2023-12-01: qty 30, 30d supply, fill #3

## 2023-03-23 ENCOUNTER — Other Ambulatory Visit (HOSPITAL_BASED_OUTPATIENT_CLINIC_OR_DEPARTMENT_OTHER): Payer: Self-pay

## 2023-03-23 MED ORDER — COMIRNATY 30 MCG/0.3ML IM SUSY
0.3000 mL | PREFILLED_SYRINGE | Freq: Once | INTRAMUSCULAR | 0 refills | Status: AC
  Filled 2023-03-23: qty 0.3, 1d supply, fill #0

## 2023-03-26 ENCOUNTER — Other Ambulatory Visit (HOSPITAL_BASED_OUTPATIENT_CLINIC_OR_DEPARTMENT_OTHER): Payer: Self-pay

## 2023-03-30 ENCOUNTER — Other Ambulatory Visit (HOSPITAL_BASED_OUTPATIENT_CLINIC_OR_DEPARTMENT_OTHER): Payer: Self-pay

## 2023-04-06 ENCOUNTER — Other Ambulatory Visit (HOSPITAL_COMMUNITY): Payer: Self-pay

## 2023-04-06 ENCOUNTER — Other Ambulatory Visit (HOSPITAL_BASED_OUTPATIENT_CLINIC_OR_DEPARTMENT_OTHER): Payer: Self-pay

## 2023-04-07 ENCOUNTER — Other Ambulatory Visit (HOSPITAL_BASED_OUTPATIENT_CLINIC_OR_DEPARTMENT_OTHER): Payer: Self-pay

## 2023-04-08 ENCOUNTER — Other Ambulatory Visit (HOSPITAL_COMMUNITY): Payer: Self-pay

## 2023-04-08 ENCOUNTER — Other Ambulatory Visit (HOSPITAL_BASED_OUTPATIENT_CLINIC_OR_DEPARTMENT_OTHER): Payer: Self-pay

## 2023-04-08 ENCOUNTER — Other Ambulatory Visit: Payer: Self-pay

## 2023-04-13 ENCOUNTER — Other Ambulatory Visit (HOSPITAL_BASED_OUTPATIENT_CLINIC_OR_DEPARTMENT_OTHER): Payer: Self-pay

## 2023-04-13 MED ORDER — INFLUENZA VAC A&B SURF ANT ADJ 0.5 ML IM SUSY
0.5000 mL | PREFILLED_SYRINGE | Freq: Once | INTRAMUSCULAR | 0 refills | Status: AC
  Filled 2023-04-13: qty 0.5, 1d supply, fill #0

## 2023-04-16 ENCOUNTER — Other Ambulatory Visit (HOSPITAL_BASED_OUTPATIENT_CLINIC_OR_DEPARTMENT_OTHER): Payer: Self-pay

## 2023-05-03 ENCOUNTER — Other Ambulatory Visit (HOSPITAL_BASED_OUTPATIENT_CLINIC_OR_DEPARTMENT_OTHER): Payer: Self-pay

## 2023-05-03 ENCOUNTER — Other Ambulatory Visit: Payer: Self-pay

## 2023-05-04 ENCOUNTER — Other Ambulatory Visit (HOSPITAL_BASED_OUTPATIENT_CLINIC_OR_DEPARTMENT_OTHER): Payer: Self-pay

## 2023-05-14 ENCOUNTER — Other Ambulatory Visit: Payer: Self-pay

## 2023-05-14 ENCOUNTER — Other Ambulatory Visit (HOSPITAL_BASED_OUTPATIENT_CLINIC_OR_DEPARTMENT_OTHER): Payer: Self-pay

## 2023-05-17 ENCOUNTER — Other Ambulatory Visit: Payer: Self-pay

## 2023-05-17 ENCOUNTER — Other Ambulatory Visit (HOSPITAL_BASED_OUTPATIENT_CLINIC_OR_DEPARTMENT_OTHER): Payer: Self-pay

## 2023-05-21 ENCOUNTER — Other Ambulatory Visit (HOSPITAL_BASED_OUTPATIENT_CLINIC_OR_DEPARTMENT_OTHER): Payer: Self-pay

## 2023-05-22 ENCOUNTER — Other Ambulatory Visit (HOSPITAL_BASED_OUTPATIENT_CLINIC_OR_DEPARTMENT_OTHER): Payer: Self-pay

## 2023-05-26 ENCOUNTER — Other Ambulatory Visit: Payer: Self-pay

## 2023-06-07 ENCOUNTER — Other Ambulatory Visit (HOSPITAL_BASED_OUTPATIENT_CLINIC_OR_DEPARTMENT_OTHER): Payer: Self-pay

## 2023-06-07 MED ORDER — INSUPEN PEN NEEDLES 32G X 4 MM MISC
3 refills | Status: AC
  Filled 2023-06-07: qty 100, 30d supply, fill #0
  Filled 2023-06-07: qty 100, 90d supply, fill #0
  Filled 2023-09-16 – 2023-10-09 (×2): qty 100, 30d supply, fill #1
  Filled 2024-01-31: qty 100, 90d supply, fill #2

## 2023-06-07 MED ORDER — ONETOUCH ULTRA TEST VI STRP
ORAL_STRIP | 1 refills | Status: DC
  Filled 2023-06-07: qty 100, 50d supply, fill #0
  Filled 2023-07-13 – 2023-07-19 (×2): qty 100, 50d supply, fill #1

## 2023-06-08 ENCOUNTER — Other Ambulatory Visit: Payer: Self-pay

## 2023-06-23 ENCOUNTER — Other Ambulatory Visit (HOSPITAL_BASED_OUTPATIENT_CLINIC_OR_DEPARTMENT_OTHER): Payer: Self-pay

## 2023-06-23 ENCOUNTER — Other Ambulatory Visit: Payer: Self-pay

## 2023-06-23 MED ORDER — DAPAGLIFLOZIN PROPANEDIOL 10 MG PO TABS
10.0000 mg | ORAL_TABLET | Freq: Every day | ORAL | 0 refills | Status: DC
  Filled 2023-06-23 – 2023-06-24 (×2): qty 30, 30d supply, fill #0
  Filled 2023-07-12 – 2023-07-20 (×3): qty 30, 30d supply, fill #1
  Filled 2023-08-09 – 2023-08-12 (×2): qty 30, 30d supply, fill #2
  Filled ????-??-??: fill #1

## 2023-06-24 ENCOUNTER — Other Ambulatory Visit (HOSPITAL_BASED_OUTPATIENT_CLINIC_OR_DEPARTMENT_OTHER): Payer: Self-pay

## 2023-06-24 ENCOUNTER — Other Ambulatory Visit: Payer: Self-pay

## 2023-06-30 ENCOUNTER — Other Ambulatory Visit (HOSPITAL_BASED_OUTPATIENT_CLINIC_OR_DEPARTMENT_OTHER): Payer: Self-pay

## 2023-07-12 ENCOUNTER — Other Ambulatory Visit (HOSPITAL_COMMUNITY): Payer: Self-pay

## 2023-07-13 ENCOUNTER — Other Ambulatory Visit: Payer: Self-pay

## 2023-07-13 ENCOUNTER — Other Ambulatory Visit (HOSPITAL_BASED_OUTPATIENT_CLINIC_OR_DEPARTMENT_OTHER): Payer: Self-pay

## 2023-07-19 ENCOUNTER — Other Ambulatory Visit: Payer: Self-pay

## 2023-07-19 ENCOUNTER — Other Ambulatory Visit (HOSPITAL_BASED_OUTPATIENT_CLINIC_OR_DEPARTMENT_OTHER): Payer: Self-pay

## 2023-07-20 ENCOUNTER — Other Ambulatory Visit: Payer: Self-pay

## 2023-07-22 ENCOUNTER — Other Ambulatory Visit (HOSPITAL_BASED_OUTPATIENT_CLINIC_OR_DEPARTMENT_OTHER): Payer: Self-pay

## 2023-07-23 ENCOUNTER — Other Ambulatory Visit: Payer: Self-pay

## 2023-08-05 ENCOUNTER — Other Ambulatory Visit (HOSPITAL_BASED_OUTPATIENT_CLINIC_OR_DEPARTMENT_OTHER): Payer: Self-pay

## 2023-08-09 ENCOUNTER — Other Ambulatory Visit: Payer: Self-pay

## 2023-08-09 ENCOUNTER — Other Ambulatory Visit (HOSPITAL_BASED_OUTPATIENT_CLINIC_OR_DEPARTMENT_OTHER): Payer: Self-pay

## 2023-08-10 ENCOUNTER — Other Ambulatory Visit (HOSPITAL_BASED_OUTPATIENT_CLINIC_OR_DEPARTMENT_OTHER): Payer: Self-pay

## 2023-08-12 ENCOUNTER — Other Ambulatory Visit: Payer: Self-pay

## 2023-08-20 ENCOUNTER — Other Ambulatory Visit (HOSPITAL_BASED_OUTPATIENT_CLINIC_OR_DEPARTMENT_OTHER): Payer: Self-pay

## 2023-08-26 ENCOUNTER — Other Ambulatory Visit: Payer: Self-pay

## 2023-09-01 ENCOUNTER — Other Ambulatory Visit (HOSPITAL_COMMUNITY): Payer: Self-pay

## 2023-09-06 ENCOUNTER — Other Ambulatory Visit: Payer: Self-pay

## 2023-09-06 ENCOUNTER — Other Ambulatory Visit (HOSPITAL_COMMUNITY): Payer: Self-pay

## 2023-09-06 ENCOUNTER — Other Ambulatory Visit (HOSPITAL_BASED_OUTPATIENT_CLINIC_OR_DEPARTMENT_OTHER): Payer: Self-pay

## 2023-09-06 MED ORDER — DAPAGLIFLOZIN PROPANEDIOL 10 MG PO TABS
10.0000 mg | ORAL_TABLET | Freq: Every day | ORAL | 0 refills | Status: DC
  Filled 2023-09-06: qty 90, 90d supply, fill #0
  Filled 2023-09-07: qty 30, 30d supply, fill #0
  Filled 2023-10-13 (×2): qty 30, 30d supply, fill #1
  Filled 2023-11-01 – 2023-11-18 (×3): qty 30, 30d supply, fill #2

## 2023-09-07 ENCOUNTER — Other Ambulatory Visit (HOSPITAL_BASED_OUTPATIENT_CLINIC_OR_DEPARTMENT_OTHER): Payer: Self-pay

## 2023-09-07 ENCOUNTER — Other Ambulatory Visit: Payer: Self-pay

## 2023-09-08 ENCOUNTER — Other Ambulatory Visit: Payer: Self-pay

## 2023-09-13 ENCOUNTER — Other Ambulatory Visit: Payer: Self-pay

## 2023-09-16 ENCOUNTER — Other Ambulatory Visit: Payer: Self-pay

## 2023-09-16 ENCOUNTER — Other Ambulatory Visit (HOSPITAL_BASED_OUTPATIENT_CLINIC_OR_DEPARTMENT_OTHER): Payer: Self-pay

## 2023-10-07 ENCOUNTER — Other Ambulatory Visit (HOSPITAL_BASED_OUTPATIENT_CLINIC_OR_DEPARTMENT_OTHER): Payer: Self-pay

## 2023-10-07 MED ORDER — REPATHA SURECLICK 140 MG/ML ~~LOC~~ SOAJ
140.0000 mg | SUBCUTANEOUS | 3 refills | Status: DC
  Filled 2023-10-08: qty 2, 28d supply, fill #0
  Filled 2023-11-04: qty 2, 28d supply, fill #1
  Filled 2023-12-03: qty 2, 28d supply, fill #2
  Filled 2024-01-06: qty 2, 28d supply, fill #3

## 2023-10-08 ENCOUNTER — Other Ambulatory Visit (HOSPITAL_BASED_OUTPATIENT_CLINIC_OR_DEPARTMENT_OTHER): Payer: Self-pay

## 2023-10-09 ENCOUNTER — Other Ambulatory Visit (HOSPITAL_BASED_OUTPATIENT_CLINIC_OR_DEPARTMENT_OTHER): Payer: Self-pay

## 2023-10-13 ENCOUNTER — Other Ambulatory Visit: Payer: Self-pay

## 2023-10-13 ENCOUNTER — Other Ambulatory Visit (HOSPITAL_BASED_OUTPATIENT_CLINIC_OR_DEPARTMENT_OTHER): Payer: Self-pay

## 2023-10-15 ENCOUNTER — Other Ambulatory Visit: Payer: Self-pay

## 2023-10-23 ENCOUNTER — Other Ambulatory Visit (HOSPITAL_COMMUNITY): Payer: Self-pay

## 2023-10-27 ENCOUNTER — Other Ambulatory Visit (HOSPITAL_BASED_OUTPATIENT_CLINIC_OR_DEPARTMENT_OTHER): Payer: Self-pay

## 2023-11-01 ENCOUNTER — Other Ambulatory Visit: Payer: Self-pay

## 2023-11-01 ENCOUNTER — Other Ambulatory Visit (HOSPITAL_BASED_OUTPATIENT_CLINIC_OR_DEPARTMENT_OTHER): Payer: Self-pay

## 2023-11-04 ENCOUNTER — Other Ambulatory Visit (HOSPITAL_BASED_OUTPATIENT_CLINIC_OR_DEPARTMENT_OTHER): Payer: Self-pay

## 2023-11-04 ENCOUNTER — Other Ambulatory Visit: Payer: Self-pay

## 2023-11-05 ENCOUNTER — Other Ambulatory Visit (HOSPITAL_BASED_OUTPATIENT_CLINIC_OR_DEPARTMENT_OTHER): Payer: Self-pay

## 2023-11-12 ENCOUNTER — Other Ambulatory Visit (HOSPITAL_BASED_OUTPATIENT_CLINIC_OR_DEPARTMENT_OTHER): Payer: Self-pay

## 2023-11-12 MED ORDER — METHOCARBAMOL 500 MG PO TABS
500.0000 mg | ORAL_TABLET | Freq: Four times a day (QID) | ORAL | 2 refills | Status: DC | PRN
Start: 2023-11-12 — End: 2024-05-15
  Filled 2023-11-12 (×2): qty 60, 8d supply, fill #0
  Filled 2023-12-02: qty 60, 8d supply, fill #1

## 2023-11-12 MED ORDER — METHYLPREDNISOLONE 4 MG PO TBPK
ORAL_TABLET | ORAL | 0 refills | Status: DC
  Filled 2023-11-12: qty 21, 6d supply, fill #0

## 2023-11-12 MED ORDER — MELOXICAM 15 MG PO TABS
15.0000 mg | ORAL_TABLET | Freq: Every day | ORAL | 2 refills | Status: DC
  Filled 2023-11-12: qty 60, 60d supply, fill #0
  Filled 2023-12-03: qty 60, 60d supply, fill #1
  Filled 2023-12-14: qty 30, 30d supply, fill #1
  Filled 2023-12-27: qty 21, 21d supply, fill #1
  Filled 2023-12-27: qty 60, 60d supply, fill #1
  Filled 2024-01-06: qty 21, 21d supply, fill #2
  Filled 2024-01-25 (×2): qty 30, 30d supply, fill #2
  Filled 2024-02-24 – 2024-03-28 (×5): qty 30, 30d supply, fill #3
  Filled 2024-04-24 – 2024-04-26 (×3): qty 30, 30d supply, fill #4
  Filled ????-??-??: fill #2

## 2023-11-17 ENCOUNTER — Other Ambulatory Visit: Payer: Self-pay

## 2023-11-18 ENCOUNTER — Other Ambulatory Visit (HOSPITAL_BASED_OUTPATIENT_CLINIC_OR_DEPARTMENT_OTHER): Payer: Self-pay

## 2023-11-18 ENCOUNTER — Other Ambulatory Visit: Payer: Self-pay

## 2023-11-19 ENCOUNTER — Other Ambulatory Visit: Payer: Self-pay

## 2023-11-20 ENCOUNTER — Other Ambulatory Visit (HOSPITAL_BASED_OUTPATIENT_CLINIC_OR_DEPARTMENT_OTHER): Payer: Self-pay

## 2023-11-22 ENCOUNTER — Other Ambulatory Visit (HOSPITAL_BASED_OUTPATIENT_CLINIC_OR_DEPARTMENT_OTHER): Payer: Self-pay

## 2023-11-22 MED ORDER — ONETOUCH ULTRA VI STRP
1.0000 | ORAL_STRIP | Freq: Four times a day (QID) | 0 refills | Status: DC
Start: 2023-11-22 — End: 2024-02-28
  Filled 2023-11-22: qty 400, 100d supply, fill #0

## 2023-11-23 ENCOUNTER — Other Ambulatory Visit: Payer: Self-pay

## 2023-11-23 ENCOUNTER — Other Ambulatory Visit (HOSPITAL_BASED_OUTPATIENT_CLINIC_OR_DEPARTMENT_OTHER): Payer: Self-pay

## 2023-11-24 ENCOUNTER — Other Ambulatory Visit (HOSPITAL_BASED_OUTPATIENT_CLINIC_OR_DEPARTMENT_OTHER): Payer: Self-pay

## 2023-11-24 ENCOUNTER — Other Ambulatory Visit: Payer: Self-pay

## 2023-11-24 MED ORDER — ONETOUCH ULTRA 2 W/DEVICE KIT
PACK | 0 refills | Status: AC
Start: 2023-11-24 — End: ?
  Filled 2023-11-24: qty 1, 30d supply, fill #0

## 2023-11-24 MED ORDER — ONETOUCH DELICA PLUS LANCET30G MISC
Freq: Two times a day (BID) | 6 refills | Status: AC
Start: 2023-11-24 — End: ?
  Filled 2023-11-24: qty 200, 90d supply, fill #0
  Filled 2024-02-16: qty 200, 90d supply, fill #1
  Filled 2024-05-16 – 2024-06-12 (×2): qty 200, 90d supply, fill #2

## 2023-12-01 ENCOUNTER — Other Ambulatory Visit (HOSPITAL_BASED_OUTPATIENT_CLINIC_OR_DEPARTMENT_OTHER): Payer: Self-pay

## 2023-12-02 ENCOUNTER — Other Ambulatory Visit (HOSPITAL_BASED_OUTPATIENT_CLINIC_OR_DEPARTMENT_OTHER): Payer: Self-pay

## 2023-12-03 ENCOUNTER — Other Ambulatory Visit (HOSPITAL_BASED_OUTPATIENT_CLINIC_OR_DEPARTMENT_OTHER): Payer: Self-pay

## 2023-12-03 ENCOUNTER — Other Ambulatory Visit: Payer: Self-pay

## 2023-12-03 MED ORDER — GLIMEPIRIDE 4 MG PO TABS
4.0000 mg | ORAL_TABLET | Freq: Two times a day (BID) | ORAL | 2 refills | Status: DC
  Filled 2023-12-14: qty 60, 30d supply, fill #0

## 2023-12-03 MED ORDER — DAPAGLIFLOZIN PROPANEDIOL 10 MG PO TABS
10.0000 mg | ORAL_TABLET | Freq: Every day | ORAL | 0 refills | Status: DC
  Filled 2023-12-14: qty 30, 30d supply, fill #0
  Filled 2024-01-06 – 2024-02-28 (×2): qty 30, 30d supply, fill #1
  Filled 2024-03-24 – 2024-03-28 (×3): qty 30, 30d supply, fill #2
  Filled ????-??-??: fill #1

## 2023-12-14 ENCOUNTER — Other Ambulatory Visit: Payer: Self-pay

## 2023-12-14 ENCOUNTER — Other Ambulatory Visit (HOSPITAL_BASED_OUTPATIENT_CLINIC_OR_DEPARTMENT_OTHER): Payer: Self-pay

## 2023-12-15 ENCOUNTER — Other Ambulatory Visit (HOSPITAL_BASED_OUTPATIENT_CLINIC_OR_DEPARTMENT_OTHER): Payer: Self-pay

## 2023-12-16 ENCOUNTER — Other Ambulatory Visit: Payer: Self-pay

## 2023-12-16 ENCOUNTER — Other Ambulatory Visit (HOSPITAL_BASED_OUTPATIENT_CLINIC_OR_DEPARTMENT_OTHER): Payer: Self-pay

## 2023-12-16 MED ORDER — GLIMEPIRIDE 4 MG PO TABS
4.0000 mg | ORAL_TABLET | Freq: Every day | ORAL | 3 refills | Status: DC
  Filled 2023-12-17 – 2023-12-22 (×5): qty 90, 90d supply, fill #0

## 2023-12-17 ENCOUNTER — Other Ambulatory Visit (HOSPITAL_BASED_OUTPATIENT_CLINIC_OR_DEPARTMENT_OTHER): Payer: Self-pay

## 2023-12-18 ENCOUNTER — Other Ambulatory Visit (HOSPITAL_BASED_OUTPATIENT_CLINIC_OR_DEPARTMENT_OTHER): Payer: Self-pay

## 2023-12-20 ENCOUNTER — Other Ambulatory Visit: Payer: Self-pay

## 2023-12-20 ENCOUNTER — Other Ambulatory Visit (HOSPITAL_BASED_OUTPATIENT_CLINIC_OR_DEPARTMENT_OTHER): Payer: Self-pay

## 2023-12-21 ENCOUNTER — Other Ambulatory Visit (HOSPITAL_BASED_OUTPATIENT_CLINIC_OR_DEPARTMENT_OTHER): Payer: Self-pay

## 2023-12-22 ENCOUNTER — Other Ambulatory Visit (HOSPITAL_BASED_OUTPATIENT_CLINIC_OR_DEPARTMENT_OTHER): Payer: Self-pay

## 2023-12-27 ENCOUNTER — Other Ambulatory Visit: Payer: Self-pay

## 2023-12-28 ENCOUNTER — Other Ambulatory Visit (HOSPITAL_BASED_OUTPATIENT_CLINIC_OR_DEPARTMENT_OTHER): Payer: Self-pay

## 2023-12-28 MED ORDER — GLIMEPIRIDE 4 MG PO TABS
4.0000 mg | ORAL_TABLET | Freq: Every day | ORAL | 3 refills | Status: DC
  Filled 2023-12-28 – 2024-01-03 (×6): qty 90, 90d supply, fill #0
  Filled 2024-01-04: qty 90, fill #0
  Filled 2024-01-05 – 2024-01-06 (×2): qty 90, 90d supply, fill #0
  Filled 2024-02-28 (×2): qty 30, 30d supply, fill #0
  Filled 2024-03-24 – 2024-03-28 (×3): qty 30, 30d supply, fill #1

## 2023-12-29 ENCOUNTER — Other Ambulatory Visit: Payer: Self-pay

## 2023-12-29 ENCOUNTER — Other Ambulatory Visit (HOSPITAL_BASED_OUTPATIENT_CLINIC_OR_DEPARTMENT_OTHER): Payer: Self-pay

## 2023-12-30 ENCOUNTER — Other Ambulatory Visit (HOSPITAL_BASED_OUTPATIENT_CLINIC_OR_DEPARTMENT_OTHER): Payer: Self-pay

## 2023-12-30 ENCOUNTER — Other Ambulatory Visit: Payer: Self-pay

## 2023-12-31 ENCOUNTER — Other Ambulatory Visit (HOSPITAL_BASED_OUTPATIENT_CLINIC_OR_DEPARTMENT_OTHER): Payer: Self-pay

## 2024-01-01 ENCOUNTER — Other Ambulatory Visit (HOSPITAL_BASED_OUTPATIENT_CLINIC_OR_DEPARTMENT_OTHER): Payer: Self-pay

## 2024-01-03 ENCOUNTER — Other Ambulatory Visit (HOSPITAL_BASED_OUTPATIENT_CLINIC_OR_DEPARTMENT_OTHER): Payer: Self-pay

## 2024-01-03 ENCOUNTER — Other Ambulatory Visit: Payer: Self-pay

## 2024-01-04 ENCOUNTER — Other Ambulatory Visit (HOSPITAL_BASED_OUTPATIENT_CLINIC_OR_DEPARTMENT_OTHER): Payer: Self-pay

## 2024-01-05 ENCOUNTER — Other Ambulatory Visit (HOSPITAL_BASED_OUTPATIENT_CLINIC_OR_DEPARTMENT_OTHER): Payer: Self-pay

## 2024-01-06 ENCOUNTER — Other Ambulatory Visit: Payer: Self-pay

## 2024-01-06 ENCOUNTER — Other Ambulatory Visit (HOSPITAL_BASED_OUTPATIENT_CLINIC_OR_DEPARTMENT_OTHER): Payer: Self-pay

## 2024-01-10 ENCOUNTER — Other Ambulatory Visit (HOSPITAL_BASED_OUTPATIENT_CLINIC_OR_DEPARTMENT_OTHER): Payer: Self-pay

## 2024-01-18 ENCOUNTER — Other Ambulatory Visit: Payer: Self-pay

## 2024-01-20 ENCOUNTER — Other Ambulatory Visit (HOSPITAL_BASED_OUTPATIENT_CLINIC_OR_DEPARTMENT_OTHER): Payer: Self-pay

## 2024-01-25 ENCOUNTER — Other Ambulatory Visit (HOSPITAL_BASED_OUTPATIENT_CLINIC_OR_DEPARTMENT_OTHER): Payer: Self-pay

## 2024-01-25 ENCOUNTER — Other Ambulatory Visit: Payer: Self-pay

## 2024-01-29 ENCOUNTER — Other Ambulatory Visit (HOSPITAL_BASED_OUTPATIENT_CLINIC_OR_DEPARTMENT_OTHER): Payer: Self-pay

## 2024-01-31 ENCOUNTER — Other Ambulatory Visit (HOSPITAL_BASED_OUTPATIENT_CLINIC_OR_DEPARTMENT_OTHER): Payer: Self-pay

## 2024-02-01 ENCOUNTER — Other Ambulatory Visit (HOSPITAL_BASED_OUTPATIENT_CLINIC_OR_DEPARTMENT_OTHER): Payer: Self-pay

## 2024-02-01 MED ORDER — MUPIROCIN 2 % EX OINT
TOPICAL_OINTMENT | CUTANEOUS | 2 refills | Status: DC
  Filled 2024-02-01: qty 22, 14d supply, fill #0
  Filled 2024-02-08 – 2024-02-11 (×4): qty 22, 14d supply, fill #1

## 2024-02-03 ENCOUNTER — Other Ambulatory Visit (HOSPITAL_BASED_OUTPATIENT_CLINIC_OR_DEPARTMENT_OTHER): Payer: Self-pay

## 2024-02-03 ENCOUNTER — Other Ambulatory Visit (HOSPITAL_COMMUNITY): Payer: Self-pay

## 2024-02-03 ENCOUNTER — Other Ambulatory Visit: Payer: Self-pay

## 2024-02-03 MED ORDER — REPATHA SURECLICK 140 MG/ML ~~LOC~~ SOAJ
140.0000 mg | SUBCUTANEOUS | 3 refills | Status: DC
  Filled 2024-02-03: qty 2, 28d supply, fill #0
  Filled 2024-02-28: qty 2, 28d supply, fill #1
  Filled 2024-03-27: qty 2, 28d supply, fill #2
  Filled 2024-04-21: qty 2, 28d supply, fill #3

## 2024-02-08 ENCOUNTER — Other Ambulatory Visit (HOSPITAL_BASED_OUTPATIENT_CLINIC_OR_DEPARTMENT_OTHER): Payer: Self-pay

## 2024-02-08 ENCOUNTER — Other Ambulatory Visit: Payer: Self-pay

## 2024-02-09 ENCOUNTER — Other Ambulatory Visit (HOSPITAL_BASED_OUTPATIENT_CLINIC_OR_DEPARTMENT_OTHER): Payer: Self-pay

## 2024-02-09 ENCOUNTER — Other Ambulatory Visit: Payer: Self-pay

## 2024-02-10 ENCOUNTER — Other Ambulatory Visit (HOSPITAL_BASED_OUTPATIENT_CLINIC_OR_DEPARTMENT_OTHER): Payer: Self-pay

## 2024-02-11 ENCOUNTER — Other Ambulatory Visit (HOSPITAL_BASED_OUTPATIENT_CLINIC_OR_DEPARTMENT_OTHER): Payer: Self-pay

## 2024-02-15 ENCOUNTER — Other Ambulatory Visit: Payer: Self-pay

## 2024-02-15 ENCOUNTER — Other Ambulatory Visit (HOSPITAL_BASED_OUTPATIENT_CLINIC_OR_DEPARTMENT_OTHER): Payer: Self-pay

## 2024-02-15 MED ORDER — VALACYCLOVIR HCL 500 MG PO TABS
500.0000 mg | ORAL_TABLET | Freq: Every day | ORAL | 3 refills | Status: DC | PRN
  Filled 2024-02-15: qty 5, 5d supply, fill #0
  Filled 2024-03-31: qty 5, 5d supply, fill #1

## 2024-02-15 MED ORDER — ACYCLOVIR 5 % EX OINT
TOPICAL_OINTMENT | Freq: Every day | CUTANEOUS | 1 refills | Status: AC
  Filled 2024-02-15: qty 15, 4d supply, fill #0
  Filled 2024-02-16: qty 60, 30d supply, fill #0

## 2024-02-16 ENCOUNTER — Other Ambulatory Visit (HOSPITAL_BASED_OUTPATIENT_CLINIC_OR_DEPARTMENT_OTHER): Payer: Self-pay

## 2024-02-16 ENCOUNTER — Other Ambulatory Visit: Payer: Self-pay

## 2024-02-18 ENCOUNTER — Other Ambulatory Visit (HOSPITAL_BASED_OUTPATIENT_CLINIC_OR_DEPARTMENT_OTHER): Payer: Self-pay

## 2024-02-18 MED ORDER — TOUJEO SOLOSTAR 300 UNIT/ML ~~LOC~~ SOPN
21.0000 [IU] | PEN_INJECTOR | Freq: Every day | SUBCUTANEOUS | 1 refills | Status: DC
  Filled 2024-02-18: qty 4.5, 64d supply, fill #0

## 2024-02-21 ENCOUNTER — Other Ambulatory Visit (HOSPITAL_BASED_OUTPATIENT_CLINIC_OR_DEPARTMENT_OTHER): Payer: Self-pay

## 2024-02-24 ENCOUNTER — Other Ambulatory Visit: Payer: Self-pay

## 2024-02-24 ENCOUNTER — Other Ambulatory Visit (HOSPITAL_BASED_OUTPATIENT_CLINIC_OR_DEPARTMENT_OTHER): Payer: Self-pay

## 2024-02-25 ENCOUNTER — Other Ambulatory Visit (HOSPITAL_BASED_OUTPATIENT_CLINIC_OR_DEPARTMENT_OTHER): Payer: Self-pay

## 2024-02-25 ENCOUNTER — Other Ambulatory Visit (HOSPITAL_COMMUNITY): Payer: Self-pay

## 2024-02-25 ENCOUNTER — Other Ambulatory Visit: Payer: Self-pay

## 2024-02-28 ENCOUNTER — Other Ambulatory Visit (HOSPITAL_BASED_OUTPATIENT_CLINIC_OR_DEPARTMENT_OTHER): Payer: Self-pay

## 2024-02-28 ENCOUNTER — Other Ambulatory Visit: Payer: Self-pay

## 2024-02-28 MED ORDER — ONETOUCH ULTRA VI STRP
ORAL_STRIP | 0 refills | Status: AC
  Filled 2024-02-28: qty 400, 90d supply, fill #0
  Filled 2024-06-12: qty 100, 25d supply, fill #0

## 2024-02-29 ENCOUNTER — Other Ambulatory Visit: Payer: Self-pay

## 2024-02-29 ENCOUNTER — Other Ambulatory Visit (HOSPITAL_BASED_OUTPATIENT_CLINIC_OR_DEPARTMENT_OTHER): Payer: Self-pay

## 2024-02-29 ENCOUNTER — Other Ambulatory Visit (HOSPITAL_COMMUNITY): Payer: Self-pay

## 2024-03-01 ENCOUNTER — Other Ambulatory Visit: Payer: Self-pay

## 2024-03-01 ENCOUNTER — Other Ambulatory Visit (HOSPITAL_BASED_OUTPATIENT_CLINIC_OR_DEPARTMENT_OTHER): Payer: Self-pay

## 2024-03-02 ENCOUNTER — Other Ambulatory Visit: Payer: Self-pay

## 2024-03-24 ENCOUNTER — Other Ambulatory Visit (HOSPITAL_BASED_OUTPATIENT_CLINIC_OR_DEPARTMENT_OTHER): Payer: Self-pay

## 2024-03-24 ENCOUNTER — Other Ambulatory Visit: Payer: Self-pay

## 2024-03-24 MED ORDER — METFORMIN HCL ER 500 MG PO TB24
1000.0000 mg | ORAL_TABLET | Freq: Two times a day (BID) | ORAL | 3 refills | Status: AC
  Filled 2024-03-24: qty 360, 90d supply, fill #0
  Filled 2024-03-24 – 2024-03-28 (×5): qty 120, 30d supply, fill #0
  Filled 2024-04-24 – 2024-04-26 (×3): qty 120, 30d supply, fill #1

## 2024-03-24 MED ORDER — EZETIMIBE 10 MG PO TABS
10.0000 mg | ORAL_TABLET | Freq: Every day | ORAL | 3 refills | Status: AC
  Filled 2024-03-24 – 2024-03-28 (×3): qty 30, 30d supply, fill #0
  Filled 2024-04-24 – 2024-06-28 (×6): qty 30, 30d supply, fill #1
  Filled 2024-07-27: qty 30, 30d supply, fill #2

## 2024-03-27 ENCOUNTER — Other Ambulatory Visit: Payer: Self-pay

## 2024-03-28 ENCOUNTER — Other Ambulatory Visit (HOSPITAL_BASED_OUTPATIENT_CLINIC_OR_DEPARTMENT_OTHER): Payer: Self-pay

## 2024-03-28 ENCOUNTER — Other Ambulatory Visit: Payer: Self-pay

## 2024-03-29 ENCOUNTER — Other Ambulatory Visit: Payer: Self-pay

## 2024-03-31 ENCOUNTER — Other Ambulatory Visit (HOSPITAL_BASED_OUTPATIENT_CLINIC_OR_DEPARTMENT_OTHER): Payer: Self-pay

## 2024-04-03 ENCOUNTER — Other Ambulatory Visit (HOSPITAL_BASED_OUTPATIENT_CLINIC_OR_DEPARTMENT_OTHER): Payer: Self-pay

## 2024-04-03 MED ORDER — COMIRNATY 30 MCG/0.3ML IM SUSY
0.3000 mL | PREFILLED_SYRINGE | Freq: Once | INTRAMUSCULAR | 0 refills | Status: AC
  Filled 2024-04-03: qty 0.3, 1d supply, fill #0

## 2024-04-10 ENCOUNTER — Other Ambulatory Visit (HOSPITAL_BASED_OUTPATIENT_CLINIC_OR_DEPARTMENT_OTHER): Payer: Self-pay

## 2024-04-18 ENCOUNTER — Inpatient Hospital Stay (HOSPITAL_COMMUNITY)
Admission: EM | Admit: 2024-04-18 | Discharge: 2024-04-22 | DRG: 482 | Disposition: A | Discharge status: Skilled Nursing Facility | Attending: Family Medicine | Admitting: Family Medicine

## 2024-04-18 ENCOUNTER — Other Ambulatory Visit: Payer: Self-pay

## 2024-04-18 ENCOUNTER — Emergency Department (HOSPITAL_COMMUNITY)

## 2024-04-18 ENCOUNTER — Inpatient Hospital Stay (HOSPITAL_COMMUNITY)

## 2024-04-18 DIAGNOSIS — Y93K1 Activity, walking an animal: Secondary | ICD-10-CM | POA: Diagnosis not present

## 2024-04-18 DIAGNOSIS — Z8049 Family history of malignant neoplasm of other genital organs: Secondary | ICD-10-CM | POA: Diagnosis not present

## 2024-04-18 DIAGNOSIS — D696 Thrombocytopenia, unspecified: Secondary | ICD-10-CM | POA: Diagnosis present

## 2024-04-18 DIAGNOSIS — S72101A Unspecified trochanteric fracture of right femur, initial encounter for closed fracture: Secondary | ICD-10-CM

## 2024-04-18 DIAGNOSIS — E1165 Type 2 diabetes mellitus with hyperglycemia: Secondary | ICD-10-CM | POA: Diagnosis present

## 2024-04-18 DIAGNOSIS — Z791 Long term (current) use of non-steroidal anti-inflammatories (NSAID): Secondary | ICD-10-CM | POA: Diagnosis not present

## 2024-04-18 DIAGNOSIS — M545 Low back pain, unspecified: Secondary | ICD-10-CM | POA: Diagnosis present

## 2024-04-18 DIAGNOSIS — S72141A Displaced intertrochanteric fracture of right femur, initial encounter for closed fracture: Principal | ICD-10-CM | POA: Diagnosis present

## 2024-04-18 DIAGNOSIS — Z885 Allergy status to narcotic agent status: Secondary | ICD-10-CM | POA: Diagnosis not present

## 2024-04-18 DIAGNOSIS — W010XXA Fall on same level from slipping, tripping and stumbling without subsequent striking against object, initial encounter: Secondary | ICD-10-CM | POA: Diagnosis present

## 2024-04-18 DIAGNOSIS — Z807 Family history of other malignant neoplasms of lymphoid, hematopoietic and related tissues: Secondary | ICD-10-CM | POA: Diagnosis not present

## 2024-04-18 DIAGNOSIS — Z808 Family history of malignant neoplasm of other organs or systems: Secondary | ICD-10-CM | POA: Diagnosis not present

## 2024-04-18 DIAGNOSIS — Z7984 Long term (current) use of oral hypoglycemic drugs: Secondary | ICD-10-CM | POA: Diagnosis not present

## 2024-04-18 DIAGNOSIS — S72001A Fracture of unspecified part of neck of right femur, initial encounter for closed fracture: Principal | ICD-10-CM

## 2024-04-18 DIAGNOSIS — Z794 Long term (current) use of insulin: Secondary | ICD-10-CM | POA: Diagnosis not present

## 2024-04-18 DIAGNOSIS — R410 Disorientation, unspecified: Secondary | ICD-10-CM | POA: Diagnosis present

## 2024-04-18 DIAGNOSIS — E785 Hyperlipidemia, unspecified: Secondary | ICD-10-CM | POA: Diagnosis present

## 2024-04-18 DIAGNOSIS — E7800 Pure hypercholesterolemia, unspecified: Secondary | ICD-10-CM | POA: Diagnosis not present

## 2024-04-18 DIAGNOSIS — Z87442 Personal history of urinary calculi: Secondary | ICD-10-CM

## 2024-04-18 DIAGNOSIS — S72109A Unspecified trochanteric fracture of unspecified femur, initial encounter for closed fracture: Principal | ICD-10-CM | POA: Diagnosis present

## 2024-04-18 DIAGNOSIS — Z888 Allergy status to other drugs, medicaments and biological substances status: Secondary | ICD-10-CM | POA: Diagnosis not present

## 2024-04-18 DIAGNOSIS — E119 Type 2 diabetes mellitus without complications: Secondary | ICD-10-CM | POA: Diagnosis not present

## 2024-04-18 DIAGNOSIS — Z85820 Personal history of malignant melanoma of skin: Secondary | ICD-10-CM

## 2024-04-18 LAB — CBC WITH DIFFERENTIAL/PLATELET
Abs Immature Granulocytes: 0.04 K/uL (ref 0.00–0.07)
Basophils Absolute: 0 K/uL (ref 0.0–0.1)
Basophils Relative: 0 %
Eosinophils Absolute: 0.1 K/uL (ref 0.0–0.5)
Eosinophils Relative: 1 %
HCT: 34.4 % — ABNORMAL LOW (ref 39.0–52.0)
Hemoglobin: 10.2 g/dL — ABNORMAL LOW (ref 13.0–17.0)
Immature Granulocytes: 0 %
Lymphocytes Relative: 6 %
Lymphs Abs: 0.6 K/uL — ABNORMAL LOW (ref 0.7–4.0)
MCH: 24.9 pg — ABNORMAL LOW (ref 26.0–34.0)
MCHC: 29.7 g/dL — ABNORMAL LOW (ref 30.0–36.0)
MCV: 84.1 fL (ref 80.0–100.0)
Monocytes Absolute: 0.4 K/uL (ref 0.1–1.0)
Monocytes Relative: 5 %
Neutro Abs: 8.1 K/uL — ABNORMAL HIGH (ref 1.7–7.7)
Neutrophils Relative %: 88 %
Platelets: 127 K/uL — ABNORMAL LOW (ref 150–400)
RBC: 4.09 MIL/uL — ABNORMAL LOW (ref 4.22–5.81)
RDW: 13.9 % (ref 11.5–15.5)
WBC: 9.3 K/uL (ref 4.0–10.5)
nRBC: 0 % (ref 0.0–0.2)

## 2024-04-18 LAB — BASIC METABOLIC PANEL WITH GFR
Anion gap: 11 (ref 5–15)
BUN: 11 mg/dL (ref 8–23)
CO2: 24 mmol/L (ref 22–32)
Calcium: 8.5 mg/dL — ABNORMAL LOW (ref 8.9–10.3)
Chloride: 105 mmol/L (ref 98–111)
Creatinine, Ser: 0.9 mg/dL (ref 0.61–1.24)
GFR, Estimated: >60 mL/min (ref >60)
Glucose, Bld: 224 mg/dL — ABNORMAL HIGH (ref 70–99)
Potassium: 4 mmol/L (ref 3.5–5.1)
Sodium: 140 mmol/L (ref 135–145)

## 2024-04-18 LAB — PROTIME-INR
INR: 0.9 (ref 0.8–1.2)
Prothrombin Time: 12.9 s (ref 11.4–15.2)

## 2024-04-18 MED ORDER — ACETAMINOPHEN 325 MG PO TABS
650.0000 mg | ORAL_TABLET | Freq: Four times a day (QID) | ORAL | Status: DC | PRN
  Administered 2024-04-20 – 2024-04-22 (×3): 650 mg via ORAL
  Filled 2024-04-18 (×3): qty 2

## 2024-04-18 MED ORDER — HYDROMORPHONE HCL 1 MG/ML IJ SOLN
0.5000 mg | INTRAMUSCULAR | Status: AC | PRN
  Administered 2024-04-18 (×2): 0.5 mg via INTRAVENOUS
  Filled 2024-04-18 (×2): qty 1

## 2024-04-18 MED ORDER — INSULIN ASPART 100 UNIT/ML IJ SOLN
0.0000 [IU] | Freq: Every day | INTRAMUSCULAR | Status: DC
  Administered 2024-04-19: 2 [IU] via SUBCUTANEOUS
  Filled 2024-04-18: qty 0.05

## 2024-04-18 MED ORDER — POLYETHYLENE GLYCOL 3350 17 G PO PACK: 17.0000 g | PACK | Freq: Every day | ORAL | Status: DC | PRN

## 2024-04-18 MED ORDER — OXYCODONE HCL 5 MG PO TABS
5.0000 mg | ORAL_TABLET | Freq: Four times a day (QID) | ORAL | Status: AC | PRN
  Administered 2024-04-20 (×3): 5 mg via ORAL
  Filled 2024-04-18 (×3): qty 1

## 2024-04-18 MED ORDER — LACTATED RINGERS IV SOLN: INTRAVENOUS | Status: AC

## 2024-04-18 MED ORDER — MELATONIN 5 MG PO TABS
5.0000 mg | ORAL_TABLET | Freq: Every evening | ORAL | Status: DC | PRN
  Administered 2024-04-20 – 2024-04-21 (×2): 5 mg via ORAL
  Filled 2024-04-18 (×2): qty 1

## 2024-04-18 MED ORDER — PROCHLORPERAZINE EDISYLATE 10 MG/2ML IJ SOLN: 5.0000 mg | Freq: Four times a day (QID) | INTRAMUSCULAR | Status: DC | PRN

## 2024-04-18 MED ORDER — INSULIN ASPART 100 UNIT/ML IJ SOLN
0.0000 [IU] | Freq: Three times a day (TID) | INTRAMUSCULAR | Status: DC
  Administered 2024-04-19: 1 [IU] via SUBCUTANEOUS
  Administered 2024-04-19: 3 [IU] via SUBCUTANEOUS
  Administered 2024-04-20: 5 [IU] via SUBCUTANEOUS
  Administered 2024-04-20 – 2024-04-22 (×7): 2 [IU] via SUBCUTANEOUS
  Filled 2024-04-18: qty 0.09

## 2024-04-18 MED ORDER — EZETIMIBE 10 MG PO TABS
10.0000 mg | ORAL_TABLET | Freq: Every day | ORAL | Status: DC
  Administered 2024-04-20 – 2024-04-22 (×3): 10 mg via ORAL
  Filled 2024-04-18 (×4): qty 1

## 2024-04-18 MED ORDER — ONDANSETRON HCL 4 MG/2ML IJ SOLN
4.0000 mg | Freq: Once | INTRAMUSCULAR | Status: AC
  Administered 2024-04-18: 4 mg via INTRAVENOUS
  Filled 2024-04-18: qty 2

## 2024-04-18 NOTE — ED Notes (Signed)
 Pt destated to 89% on room air after IV Dilaudid . Placed on 2 L [place] with improvement to 94%

## 2024-04-18 NOTE — ED Triage Notes (Signed)
 Pt BIB [facility] from home. Had a trip and fall while walking his dog falling onto his right side. Presents with right hip and radiating down the leg. Was given 200 mcg fent in route with some pain improvement. No head injury. No LOC. No blood thinner.   [facility] IV 20G Right Hand

## 2024-04-18 NOTE — ED Notes (Signed)
 Pain medication given in imaging for pain comfort and cooperation with ordered Xrs

## 2024-04-18 NOTE — ED Provider Notes (Signed)
 [place] EMERGENCY DEPARTMENT AT [facility] Provider Note   CSN: [id number] Arrival date & time: 04/18/24  1916     Patient presents with: Samuel Ramos is a 83 y.o. male.  {Add pertinent medical, surgical, social history, OB history to YEP:67052}  Fall   Patient has a history of kidney stones pancreatitis diverticulitis diabetes.  He presents ED for evaluation after a fall.  Patient was walking his dog and tripped and fell.  He had up landing on his right side.  Patient states he has been having pain in his lower back and right hip since then.  He was not able to stand up and was lying on the ground for about an hour.  Patient was brought in by EMS he was given fentanyl  with some improvement of the pain.  He denies any headache.  No head injury.  No chest pain no abdominal pain.    Prior to Admission medications   Medication Sig Start Date End Date Taking? Authorizing Provider  amoxicillin  (AMOXIL ) 500 MG capsule Take 2 capsules by mouth now then take 1 capsule (500 mg total) by mouth 3 (three) times daily until gone 11/10/22     bisacodyl (DULCOLAX) 5 MG EC tablet Take 5 mg by mouth daily.    [provider]  Blood Glucose Monitoring Suppl (ONE TOUCH ULTRA 2) w/Device KIT Use as directed 11/24/23     dapagliflozin  propanediol (FARXIGA ) 10 MG TABS tablet Take 1 tablet (10 mg total) by mouth daily. 12/03/23     Evolocumab  (REPATHA  SURECLICK) 140 MG/ML SOAJ Inject 140 mg into the skin every 14 (fourteen) days. 02/03/24     ezetimibe  (ZETIA ) 10 MG tablet Take 1 tablet (10 mg total) by mouth daily. 03/24/24     ferrous sulfate 325 (65 FE) MG EC tablet Take 325 mg by mouth 2 (two) times daily.    [provider]  fluconazole  (DIFLUCAN ) 100 MG tablet Take 1 tablet (100 mg total) by mouth daily. 04/08/22     glimepiride  (AMARYL ) 4 MG tablet Take 4 mg by mouth 2 (two) times daily.    [provider]  glimepiride  (AMARYL ) 4 MG tablet Take 1 tablet (4  mg total) by mouth daily with breakfast or first main meal of the day. 12/16/23     glimepiride  (AMARYL ) 4 MG tablet Take 1 tablet (4 mg total) by mouth with breakfast or the first main meal of the day. 12/28/23     glucose blood (ONETOUCH ULTRA) test strip TEST UP TO FOUR TIMES DAILY. 02/28/24     insulin  glargine, 1 Unit Dial , (TOUJEO  SOLOSTAR) 300 UNIT/ML Solostar Pen Inject 21 Units into the skin daily. 02/18/24     insulin  glargine, 1 Unit Dial , (TOUJEO ) 300 UNIT/ML Solostar Pen Inject 21 Units into the skin daily. 01/22/23     Insulin  Pen Needle (INSUPEN PEN NEEDLES) 32G X 4 MM MISC Use to check blood sugar once daily 06/07/23     ketoconazole  (NIZORAL ) 2 % cream Apply to affected area(s) twice daily for 14 days. 04/07/22     Lancets (ONETOUCH DELICA PLUS LANCET30G) MISC Use upto 2 (two) times daily to check blood sugar 11/24/23     lisinopril (PRINIVIL,ZESTRIL) 5 MG tablet Take 5 mg by mouth every evening.    [provider]  Melatonin 10 MG TABS Take 10 mg by mouth at bedtime.    [provider]  meloxicam  (MOBIC ) 15 MG tablet Take 1 tablet (15  mg total) by mouth daily  with food for inflammation 11/12/23     metFORMIN  (GLUCOPHAGE -XR) 500 MG 24 hr tablet Take 1,000 mg by mouth 2 (two) times daily.    [provider]  metFORMIN  (GLUCOPHAGE -XR) 500 MG 24 hr tablet Take 2 tablets (1,000 mg total) by mouth 2 (two) times daily at 8 am and 10 pm. 03/24/24     methocarbamol  (ROBAXIN ) 500 MG tablet Take 1-2 tablets (500-1,000 mg total) by mouth every 6 (six) to 8 (hours) as needed for muscle spasms 11/12/23     methylPREDNISolone  (MEDROL ) 4 MG TBPK tablet Take as prescribed on dose pack over 6 days 11/12/23     mupirocin  ointment (BACTROBAN ) 2 % Apply a thin layer to the inside of each nostril twice a day as directed 02/01/24     saccharomyces boulardii (FLORASTOR) 250 MG capsule Take 250 mg by mouth daily.    [provider]  traMADol  (ULTRAM ) 50 MG tablet Take 1 tablet (50 mg  total) by mouth every 6 (six) hours as needed for pain. 11/24/22     valACYclovir  (VALTREX ) 500 MG tablet Take 1 tablet (500 mg total) by mouth daily as needed. 03/17/23     valACYclovir  (VALTREX ) 500 MG tablet Take 1 tablet (500 mg total) by mouth daily as needed for up to 5 days. 02/15/24       Allergies: Januvia [sitagliptin], Gabapentin, and Vicodin [hydrocodone-acetaminophen ]    Review of Systems  Updated Vital Signs BP 129/66 (BP Location: Right Arm)   Pulse 85   Temp 98 F (36.7 C) (Oral)   Resp 18   SpO2 96%   Physical Exam Vitals and nursing note reviewed.  Constitutional:      General: He is not in acute distress.    Appearance: He is well-developed.  HENT:     Head: Normocephalic and atraumatic.     Right Ear: External ear normal.     Left Ear: External ear normal.  Eyes:     General: No scleral icterus.       Right eye: No discharge.        Left eye: No discharge.     Conjunctiva/sclera: Conjunctivae normal.  Neck:     Trachea: No tracheal deviation.  Cardiovascular:     Rate and Rhythm: Normal rate and regular rhythm.  Pulmonary:     Effort: Pulmonary effort is normal. No respiratory distress.     Breath sounds: Normal breath sounds. No stridor. No wheezing or rales.  Abdominal:     General: Bowel sounds are normal. There is no distension.     Palpations: Abdomen is soft.     Tenderness: There is no abdominal tenderness. There is no guarding or rebound.  Musculoskeletal:        General: Tenderness present. No deformity.     Cervical back: Neck supple.     Lumbar back: Tenderness present.     Right hip: Bony tenderness present. Decreased range of motion.  Skin:    General: Skin is warm and dry.     Findings: No rash.  Neurological:     General: No focal deficit present.     Mental Status: He is alert.     Cranial Nerves: No cranial nerve deficit, dysarthria or facial asymmetry.     Sensory: No sensory deficit.     Motor: No abnormal muscle tone or seizure  activity.     Coordination: Coordination normal.  Psychiatric:  Mood and Affect: Mood normal.     (all labs ordered are listed, but only abnormal results are displayed) Labs Reviewed  BASIC METABOLIC PANEL WITH GFR  CBC WITH DIFFERENTIAL/PLATELET  PROTIME-INR  TYPE AND SCREEN    EKG: None  Radiology: No results found.  {Document cardiac monitor, telemetry assessment procedure when appropriate:32947} Procedures   Medications Ordered in the ED  HYDROmorphone  (DILAUDID ) injection 0.5 mg (has no administration in time range)  ondansetron  (ZOFRAN ) injection 4 mg (has no administration in time range)      {Click here for ABCD2, HEART and other calculators REFRESH Note before signing:1}                              Medical Decision Making Amount and/or Complexity of Data Reviewed Labs: ordered. Radiology: ordered.  Risk Prescription drug management.   ***  {Document critical care time when appropriate  Document review of labs and clinical decision tools ie CHADS2VASC2, etc  Document your independent review of radiology images and any outside records  Document your discussion with family members, caretakers and with consultants  Document social determinants of health affecting pt's care  Document your decision making why or why not admission, treatments were needed:32947:::1}   Final diagnoses:  None    ED Discharge Orders     None

## 2024-04-18 NOTE — H&P (Signed)
 History and Physical  Samuel Ramos. FMW:[id number] DOB: 09-06-1940 DOA: 04/18/2024  Referring physician: Dr. Randol, EDP  PCP: Charlott Dorn LABOR, MD  Outpatient Specialists: None Patient coming from: Home  Chief Complaint: Fall   HPI: Samuel Ramos. is a 83 y.o. male with medical history significant for type 2 diabetes, hyperlipidemia, prior history of tibia/fibula fracture in his 5s, who presents to the ER after a mechanical fall.  The patient was walking his dog when he tripped and fell.  He was in his normal state of health prior to this.  He landed on his right side.  He was on the ground for hours.  He could not get back up without assistance.  In the ER, with significant pain in the right hip which is shortened and externally rotated.  Right hip x-ray showing acute displaced right intratrochanteric femoral fracture extending to the proximal femoral shaft.  EDP discussed the case with orthopedic surgery, Dr. Fidel.  N.p.o. after midnight, until seen by orthopedic surgery tomorrow.  Admitted for surgical repair of acute displaced right intertrochanteric femoral fracture extending to the proximal femoral shaft.   ED Course: Temperature 98.  BP 117/66, pulse 90, respiratory rate 11, O2 saturation 97% on room air.  Review of Systems: Review of systems as noted in the HPI. All other systems reviewed and are negative.   Past Medical History:  Diagnosis Date   BPH (benign prostatic hyperplasia)    Cancer (HCC)    melanoma left arm and back removed   Diabetes mellitus without complication (HCC)    Type II   Diverticulitis    Pancreatitis    many years ago   Renal stone    Past Surgical History:  Procedure Laterality Date   LUMBAR LAMINECTOMY/DECOMPRESSION MICRODISCECTOMY Left 05/04/2018   Procedure: LEFT-SIDED LUMBAR 4-5 MICRODISECTOMY;  Surgeon: Beuford Anes, MD;  Location: [facility] OR;  Service: Orthopedics;  Laterality: Left;   PROSTATECTOMY N/A 01/21/2015    Procedure: PROSTATECTOMY RETROPUBIC;  Surgeon: Arlena Gal, MD;  Location: [facility] ORS;  Service: Urology;  Laterality: N/A;   right leg broken (tibia and fibula) 1961     SHOULDER ARTHROSCOPY WITH OPEN ROTATOR CUFF REPAIR  2002   right   TONSILLECTOMY     WISDOM TOOTH EXTRACTION      Social History:  reports that he has never smoked. He has never used smokeless tobacco. He reports current alcohol use. He reports that he does not use drugs.   Allergies  Allergen Reactions   Januvia [Sitagliptin] Other (See Comments)    Pancreatitis    Gabapentin Other (See Comments)    Fatigue and cramps    Vicodin [Hydrocodone-Acetaminophen ] Other (See Comments)    Nightmares    Family History  Problem Relation Age of Onset   Melanoma Father    Lymphoma Sister    Uterine cancer Mother    Pancreatitis Neg Hx       Prior to Admission medications   Medication Sig Start Date End Date Taking? Authorizing Provider  amoxicillin  (AMOXIL ) 500 MG capsule Take 2 capsules by mouth now then take 1 capsule (500 mg total) by mouth 3 (three) times daily until gone 11/10/22     bisacodyl (DULCOLAX) 5 MG EC tablet Take 5 mg by mouth daily.    [provider]  Blood Glucose Monitoring Suppl (ONE TOUCH ULTRA 2) w/Device KIT Use as directed 11/24/23     dapagliflozin  propanediol (FARXIGA ) 10 MG TABS tablet Take 1 tablet (10 mg  total) by mouth daily. 12/03/23     Evolocumab  (REPATHA  SURECLICK) 140 MG/ML SOAJ Inject 140 mg into the skin every 14 (fourteen) days. 02/03/24     ezetimibe  (ZETIA ) 10 MG tablet Take 1 tablet (10 mg total) by mouth daily. 03/24/24     ferrous sulfate 325 (65 FE) MG EC tablet Take 325 mg by mouth 2 (two) times daily.    [provider]  fluconazole  (DIFLUCAN ) 100 MG tablet Take 1 tablet (100 mg total) by mouth daily. 04/08/22     glimepiride  (AMARYL ) 4 MG tablet Take 4 mg by mouth 2 (two) times daily.    [provider]  glimepiride  (AMARYL ) 4 MG tablet Take 1  tablet (4 mg total) by mouth daily with breakfast or first main meal of the day. 12/16/23     glimepiride  (AMARYL ) 4 MG tablet Take 1 tablet (4 mg total) by mouth with breakfast or the first main meal of the day. 12/28/23     glucose blood (ONETOUCH ULTRA) test strip TEST UP TO FOUR TIMES DAILY. 02/28/24     insulin  glargine, 1 Unit Dial , (TOUJEO  SOLOSTAR) 300 UNIT/ML Solostar Pen Inject 21 Units into the skin daily. 02/18/24     insulin  glargine, 1 Unit Dial , (TOUJEO ) 300 UNIT/ML Solostar Pen Inject 21 Units into the skin daily. 01/22/23     Insulin  Pen Needle (INSUPEN PEN NEEDLES) 32G X 4 MM MISC Use to check blood sugar once daily 06/07/23     ketoconazole  (NIZORAL ) 2 % cream Apply to affected area(s) twice daily for 14 days. 04/07/22     Lancets (ONETOUCH DELICA PLUS LANCET30G) MISC Use upto 2 (two) times daily to check blood sugar 11/24/23     lisinopril (PRINIVIL,ZESTRIL) 5 MG tablet Take 5 mg by mouth every evening.    [provider]  Melatonin 10 MG TABS Take 10 mg by mouth at bedtime.    [provider]  meloxicam  (MOBIC ) 15 MG tablet Take 1 tablet (15 mg total) by mouth daily  with food for inflammation 11/12/23     metFORMIN  (GLUCOPHAGE -XR) 500 MG 24 hr tablet Take 1,000 mg by mouth 2 (two) times daily.    [provider]  metFORMIN  (GLUCOPHAGE -XR) 500 MG 24 hr tablet Take 2 tablets (1,000 mg total) by mouth 2 (two) times daily at 8 am and 10 pm. 03/24/24     methocarbamol  (ROBAXIN ) 500 MG tablet Take 1-2 tablets (500-1,000 mg total) by mouth every 6 (six) to 8 (hours) as needed for muscle spasms 11/12/23     methylPREDNISolone  (MEDROL ) 4 MG TBPK tablet Take as prescribed on dose pack over 6 days 11/12/23     mupirocin  ointment (BACTROBAN ) 2 % Apply a thin layer to the inside of each nostril twice a day as directed 02/01/24     saccharomyces boulardii (FLORASTOR) 250 MG capsule Take 250 mg by mouth daily.    [provider]  traMADol  (ULTRAM ) 50 MG tablet Take 1 tablet  (50 mg total) by mouth every 6 (six) hours as needed for pain. 11/24/22     valACYclovir  (VALTREX ) 500 MG tablet Take 1 tablet (500 mg total) by mouth daily as needed. 03/17/23     valACYclovir  (VALTREX ) 500 MG tablet Take 1 tablet (500 mg total) by mouth daily as needed for up to 5 days. 02/15/24       Physical Exam: BP 117/66   Pulse 90   Temp 98 F (36.7 C) (Oral)   Resp 11  SpO2 97%   General: 82 y.o. year-old male well developed well nourished in no acute distress.  Alert and oriented x3. Cardiovascular: Regular rate and rhythm with no rubs or gallops.  No thyromegaly or JVD noted.  No lower extremity edema. 2/4 pulses in all 4 extremities. Respiratory: Clear to auscultation with no wheezes or rales. Good inspiratory effort. Abdomen: Soft nontender nondistended with normal bowel sounds x4 quadrants. Muskuloskeletal: No cyanosis, clubbing or edema noted bilaterally.  The right lower extremity is shortened and externally rotated. Neuro: CN II-XII intact, strength, sensation, reflexes Skin: No ulcerative lesions noted or rashes Psychiatry: Judgement and insight appear normal. Mood is appropriate for condition and setting          Labs on Admission:  Basic Metabolic Panel: Recent Labs  Lab 04/18/24 2037  NA 140  K 4.0  CL 105  CO2 24  GLUCOSE 224*  BUN 11  CREATININE 0.90  CALCIUM 8.5*   Liver Function Tests: No results for input(s): AST, ALT, ALKPHOS, BILITOT, PROT, ALBUMIN in the last 168 hours. No results for input(s): LIPASE, AMYLASE in the last 168 hours. No results for input(s): AMMONIA in the last 168 hours. CBC: Recent Labs  Lab 04/18/24 2037  WBC 9.3  NEUTROABS 8.1*  HGB 10.2*  HCT 34.4*  MCV 84.1  PLT 127*   Cardiac Enzymes: No results for input(s): CKTOTAL, CKMB, CKMBINDEX, TROPONINI in the last 168 hours.  BNP (last 3 results) No results for input(s): BNP in the last 8760 hours.  ProBNP (last 3 results) No results for  input(s): PROBNP in the last 8760 hours.  CBG: No results for input(s): GLUCAP in the last 168 hours.  Radiological Exams on Admission: DG Lumbar Spine 2-3 Views Result Date: 04/18/2024 CLINICAL DATA:  [id number] Fall [id number] EXAM: LUMBAR SPINE - 2-3 VIEW COMPARISON:  X-ray lumbar spine 05/04/2018 FINDINGS: There is no evidence of lumbar spine fracture. Multilevel moderate degenerative change of the spine. Alignment is normal. Intervertebral disc spaces are maintained. Atherosclerotic plaque. IMPRESSION: No acute displaced fracture or traumatic listhesis of the lumbar spine. Electronically Signed   By: Morgane  Naveau M.D.   On: 04/18/2024 20:41   DG Chest Port 1 View Result Date: 04/18/2024 CLINICAL DATA:  fall, pain Pt fell while walking his dog this evening, pt fell on right hip. Pt c/o right hip pain radiating down leg. EXAM: PORTABLE CHEST 1 VIEW COMPARISON:  Chest x-ray 08/19/2022. FINDINGS: The heart and mediastinal contours are unchanged. Atherosclerotic plaque. No focal consolidation. No pulmonary edema. No pleural effusion. No pneumothorax. No acute osseous abnormality. IMPRESSION: 1. No active disease. 2.  Aortic Atherosclerosis (ICD10-I70.0). Electronically Signed   By: Morgane  Naveau M.D.   On: 04/18/2024 20:38   DG Hip Unilat With Pelvis 2-3 Views Right Result Date: 04/18/2024 CLINICAL DATA:  fall, pain EXAM: DG HIP (WITH OR WITHOUT PELVIS) 2-3V RIGHT COMPARISON:  CT abdomen pelvis 01/06/2013 FINDINGS: Acute displaced right intratrochanteric femoral fracture extending to the proximal femoral shaft. No right hip dislocation. No acute displaced fracture or dislocation of the left hip on frontal view. Question avascular necrosis of the left femoral head. There is no evidence of severe arthropathy or other focal bone abnormality. IMPRESSION: Acute displaced right intratrochanteric femoral fracture extending to the proximal femoral shaft. Electronically Signed   By: Morgane  Naveau M.D.   On:  04/18/2024 20:36    EKG: I independently viewed the EKG done and my findings are as followed: Sinus rhythm rate of 81.  Nonspecific  ST-T changes.  QTc 489.  Assessment/Plan Present on Admission:  Trochanteric fracture [facility])  Principal Problem:   Trochanteric fracture (HCC)  Acute displaced right intratrochanteric femoral fracture extending to the proximal femoral shaft. Orthopedic surgery will see in consultation. Pain control Gentle IV fluid hydration Fall precautions.  Type 2 diabetes with hyperglycemia Last hemoglobin A1c 9.2 in 2019. Carb modified diet. Insulin  sliding scale.  Hyperlipidemia Resume home Zetia   Thrombocytopenia Platelet count 127 Monitor for now   Time: 75 minutes.   DVT prophylaxis: SCDs  Code Status: Full code.  Family Communication: None at bedside.  Disposition Plan: Admitted to MedSurg unit.  Consults called: Orthopedic surgery.  Admission status: Inpatient status.   Status is: Inpatient The patient requires at least 2 midnights for further evaluation and treatment of present condition.   Terry LOISE Hurst MD [facility] Pager [phone]  If 7PM-7AM, please contact night-coverage [email or web address] Password [email or web address]  04/18/2024, 10:03 PM

## 2024-04-19 ENCOUNTER — Encounter (HOSPITAL_COMMUNITY): Payer: Self-pay | Admitting: Internal Medicine

## 2024-04-19 ENCOUNTER — Inpatient Hospital Stay (HOSPITAL_COMMUNITY)

## 2024-04-19 ENCOUNTER — Inpatient Hospital Stay (HOSPITAL_COMMUNITY): Admitting: Anesthesiology

## 2024-04-19 ENCOUNTER — Ambulatory Visit: Payer: Self-pay | Admitting: Student

## 2024-04-19 ENCOUNTER — Encounter (HOSPITAL_COMMUNITY)
Admission: EM | Disposition: A | Discharge status: Skilled Nursing Facility | Payer: Self-pay | Source: Home / Self Care | Attending: Family Medicine

## 2024-04-19 ENCOUNTER — Other Ambulatory Visit: Payer: Self-pay

## 2024-04-19 DIAGNOSIS — S72101A Unspecified trochanteric fracture of right femur, initial encounter for closed fracture: Secondary | ICD-10-CM | POA: Diagnosis not present

## 2024-04-19 DIAGNOSIS — E119 Type 2 diabetes mellitus without complications: Secondary | ICD-10-CM

## 2024-04-19 DIAGNOSIS — E7800 Pure hypercholesterolemia, unspecified: Secondary | ICD-10-CM

## 2024-04-19 HISTORY — PX: INTRAMEDULLARY (IM) NAIL INTERTROCHANTERIC: SHX5875

## 2024-04-19 LAB — GLUCOSE, CAPILLARY
Glucose-Capillary: 113 mg/dL — ABNORMAL HIGH (ref 70–99)
Glucose-Capillary: 133 mg/dL — ABNORMAL HIGH (ref 70–99)
Glucose-Capillary: 154 mg/dL — ABNORMAL HIGH (ref 70–99)
Glucose-Capillary: 211 mg/dL — ABNORMAL HIGH (ref 70–99)
Glucose-Capillary: 216 mg/dL — ABNORMAL HIGH (ref 70–99)
Glucose-Capillary: 240 mg/dL — ABNORMAL HIGH (ref 70–99)

## 2024-04-19 LAB — CBC WITH DIFFERENTIAL/PLATELET
Abs Immature Granulocytes: 0.04 K/uL (ref 0.00–0.07)
Basophils Absolute: 0 K/uL (ref 0.0–0.1)
Basophils Relative: 0 %
Eosinophils Absolute: 0 K/uL (ref 0.0–0.5)
Eosinophils Relative: 1 %
HCT: 28.4 % — ABNORMAL LOW (ref 39.0–52.0)
Hemoglobin: 8.4 g/dL — ABNORMAL LOW (ref 13.0–17.0)
Immature Granulocytes: 1 %
Lymphocytes Relative: 12 %
Lymphs Abs: 0.9 K/uL (ref 0.7–4.0)
MCH: 25.2 pg — ABNORMAL LOW (ref 26.0–34.0)
MCHC: 29.6 g/dL — ABNORMAL LOW (ref 30.0–36.0)
MCV: 85.3 fL (ref 80.0–100.0)
Monocytes Absolute: 0.7 K/uL (ref 0.1–1.0)
Monocytes Relative: 10 %
Neutro Abs: 5.7 K/uL (ref 1.7–7.7)
Neutrophils Relative %: 76 %
Platelets: 116 K/uL — ABNORMAL LOW (ref 150–400)
RBC: 3.33 MIL/uL — ABNORMAL LOW (ref 4.22–5.81)
RDW: 14.4 % (ref 11.5–15.5)
WBC: 7.5 K/uL (ref 4.0–10.5)
nRBC: 0 % (ref 0.0–0.2)

## 2024-04-19 LAB — HEMOGLOBIN A1C
Hgb A1c MFr Bld: 9 % — ABNORMAL HIGH (ref 4.8–5.6)
Mean Plasma Glucose: 211.6 mg/dL

## 2024-04-19 LAB — PREPARE RBC (CROSSMATCH)

## 2024-04-19 SURGERY — FIXATION, FRACTURE, INTERTROCHANTERIC, WITH INTRAMEDULLARY ROD
Anesthesia: General | Site: Hip | Laterality: Right

## 2024-04-19 MED ORDER — LIDOCAINE 2% (20 MG/ML) 5 ML SYRINGE
INTRAMUSCULAR | Status: DC | PRN
  Administered 2024-04-19: 100 mg via INTRAVENOUS

## 2024-04-19 MED ORDER — OXYCODONE HCL 5 MG/5ML PO SOLN: 5.0000 mg | Freq: Once | ORAL | Status: DC | PRN

## 2024-04-19 MED ORDER — BISACODYL 10 MG RE SUPP: 10.0000 mg | Freq: Every day | RECTAL | Status: DC | PRN

## 2024-04-19 MED ORDER — SUGAMMADEX SODIUM 200 MG/2ML IV SOLN
INTRAVENOUS | Status: AC
  Filled 2024-04-19: qty 2

## 2024-04-19 MED ORDER — DOCUSATE SODIUM 100 MG PO CAPS
100.0000 mg | ORAL_CAPSULE | Freq: Two times a day (BID) | ORAL | Status: DC
Start: 2024-04-20 — End: 2024-04-22
  Administered 2024-04-20 – 2024-04-22 (×5): 100 mg via ORAL
  Filled 2024-04-19 (×5): qty 1

## 2024-04-19 MED ORDER — HYDROMORPHONE HCL 1 MG/ML IJ SOLN
INTRAMUSCULAR | Status: AC
  Filled 2024-04-19: qty 2

## 2024-04-19 MED ORDER — INSULIN GLARGINE 100 UNIT/ML ~~LOC~~ SOLN
21.0000 [IU] | Freq: Every day | SUBCUTANEOUS | Status: DC
  Administered 2024-04-19: 21 [IU] via SUBCUTANEOUS
  Filled 2024-04-19 (×2): qty 0.21

## 2024-04-19 MED ORDER — PHENYLEPHRINE HCL-NACL 20-0.9 MG/250ML-% IV SOLN
INTRAVENOUS | Status: DC | PRN
  Administered 2024-04-19: 50 ug/min via INTRAVENOUS

## 2024-04-19 MED ORDER — KETAMINE HCL 50 MG/5ML IJ SOSY
PREFILLED_SYRINGE | INTRAMUSCULAR | Status: DC | PRN
  Administered 2024-04-19: 20 mg via INTRAVENOUS

## 2024-04-19 MED ORDER — METHOCARBAMOL 500 MG PO TABS
500.0000 mg | ORAL_TABLET | Freq: Once | ORAL | Status: DC
Start: 2024-04-19 — End: 2024-04-22
  Filled 2024-04-19: qty 1

## 2024-04-19 MED ORDER — METHOCARBAMOL 500 MG PO TABS
500.0000 mg | ORAL_TABLET | Freq: Four times a day (QID) | ORAL | Status: DC | PRN
  Administered 2024-04-20 – 2024-04-21 (×3): 500 mg via ORAL
  Filled 2024-04-19 (×3): qty 1

## 2024-04-19 MED ORDER — FENTANYL CITRATE (PF) 100 MCG/2ML IJ SOLN
INTRAMUSCULAR | Status: DC | PRN
  Administered 2024-04-19: 100 ug via INTRAVENOUS

## 2024-04-19 MED ORDER — STERILE WATER FOR IRRIGATION IR SOLN
Status: DC | PRN
  Administered 2024-04-19: 1000 mL

## 2024-04-19 MED ORDER — SODIUM CHLORIDE 0.9 % IV SOLN: 10.0000 mL/h | Freq: Once | INTRAVENOUS | Status: DC

## 2024-04-19 MED ORDER — POVIDONE-IODINE 10 % EX SWAB
2.0000 | Freq: Once | CUTANEOUS | Status: AC
  Administered 2024-04-19: 2 via TOPICAL

## 2024-04-19 MED ORDER — ISOPROPYL ALCOHOL 70 % SOLN
Status: AC
  Filled 2024-04-19: qty 480

## 2024-04-19 MED ORDER — LIDOCAINE HCL (PF) 2 % IJ SOLN
INTRAMUSCULAR | Status: AC
  Filled 2024-04-19: qty 5

## 2024-04-19 MED ORDER — FENTANYL CITRATE (PF) 100 MCG/2ML IJ SOLN
INTRAMUSCULAR | Status: AC
  Filled 2024-04-19: qty 2

## 2024-04-19 MED ORDER — ROCURONIUM BROMIDE 10 MG/ML (PF) SYRINGE
PREFILLED_SYRINGE | INTRAVENOUS | Status: AC
  Filled 2024-04-19: qty 10

## 2024-04-19 MED ORDER — OXYCODONE HCL 5 MG PO TABS: 5.0000 mg | ORAL_TABLET | Freq: Once | ORAL | Status: DC | PRN

## 2024-04-19 MED ORDER — ONDANSETRON HCL 4 MG/2ML IJ SOLN
INTRAMUSCULAR | Status: AC
  Filled 2024-04-19: qty 2

## 2024-04-19 MED ORDER — HYDROMORPHONE HCL 1 MG/ML IJ SOLN
0.5000 mg | INTRAMUSCULAR | Status: DC | PRN
  Administered 2024-04-19: 0.5 mg via INTRAVENOUS

## 2024-04-19 MED ORDER — TRANEXAMIC ACID-NACL 1000-0.7 MG/100ML-% IV SOLN
1000.0000 mg | INTRAVENOUS | Status: AC
  Administered 2024-04-19: 1000 mg via INTRAVENOUS
  Filled 2024-04-19: qty 100

## 2024-04-19 MED ORDER — MEPERIDINE HCL 100 MG/ML IJ SOLN: 6.2500 mg | INTRAMUSCULAR | Status: DC | PRN

## 2024-04-19 MED ORDER — DROPERIDOL 2.5 MG/ML IJ SOLN: 0.6250 mg | Freq: Once | INTRAMUSCULAR | Status: DC | PRN

## 2024-04-19 MED ORDER — HYDROMORPHONE HCL 1 MG/ML IJ SOLN
0.5000 mg | INTRAMUSCULAR | Status: AC | PRN
  Administered 2024-04-19 (×2): 0.5 mg via INTRAVENOUS
  Filled 2024-04-19 (×2): qty 0.5

## 2024-04-19 MED ORDER — METHOCARBAMOL 1000 MG/10ML IJ SOLN: 500.0000 mg | Freq: Four times a day (QID) | INTRAMUSCULAR | Status: DC | PRN

## 2024-04-19 MED ORDER — METOCLOPRAMIDE HCL 5 MG/ML IJ SOLN: 5.0000 mg | Freq: Three times a day (TID) | INTRAMUSCULAR | Status: DC | PRN

## 2024-04-19 MED ORDER — ONDANSETRON HCL 4 MG/2ML IJ SOLN
INTRAMUSCULAR | Status: DC | PRN
  Administered 2024-04-19: 4 mg via INTRAVENOUS

## 2024-04-19 MED ORDER — HYDROMORPHONE HCL 1 MG/ML IJ SOLN
INTRAMUSCULAR | Status: DC | PRN
  Administered 2024-04-19: 1 mg via INTRAVENOUS

## 2024-04-19 MED ORDER — HYDROMORPHONE HCL 1 MG/ML IJ SOLN
INTRAMUSCULAR | Status: AC
  Filled 2024-04-19: qty 1

## 2024-04-19 MED ORDER — HYDROMORPHONE HCL 1 MG/ML IJ SOLN
0.2500 mg | INTRAMUSCULAR | Status: DC | PRN
  Administered 2024-04-19 (×2): 0.5 mg via INTRAVENOUS

## 2024-04-19 MED ORDER — ALBUMIN HUMAN 5 % IV SOLN: INTRAVENOUS | Status: DC | PRN

## 2024-04-19 MED ORDER — HYDROMORPHONE HCL 2 MG/ML IJ SOLN
INTRAMUSCULAR | Status: AC
  Filled 2024-04-19: qty 1

## 2024-04-19 MED ORDER — ISOPROPYL ALCOHOL 70 % SOLN
Status: DC | PRN
  Administered 2024-04-19: 1 via TOPICAL

## 2024-04-19 MED ORDER — CEFAZOLIN SODIUM-DEXTROSE 2-4 GM/100ML-% IV SOLN
2.0000 g | Freq: Four times a day (QID) | INTRAVENOUS | Status: AC
  Administered 2024-04-20 (×2): 2 g via INTRAVENOUS
  Filled 2024-04-19 (×2): qty 100

## 2024-04-19 MED ORDER — METOCLOPRAMIDE HCL 10 MG PO TABS: 5.0000 mg | ORAL_TABLET | Freq: Three times a day (TID) | ORAL | Status: DC | PRN

## 2024-04-19 MED ORDER — LACTATED RINGERS IV SOLN: INTRAVENOUS | Status: DC

## 2024-04-19 MED ORDER — CHLORHEXIDINE GLUCONATE 4 % EX SOLN
60.0000 mL | Freq: Once | CUTANEOUS | Status: DC
Start: 2024-04-19 — End: 2024-04-19

## 2024-04-19 MED ORDER — ROCURONIUM BROMIDE 10 MG/ML (PF) SYRINGE
PREFILLED_SYRINGE | INTRAVENOUS | Status: DC | PRN
  Administered 2024-04-19: 50 mg via INTRAVENOUS

## 2024-04-19 MED ORDER — PROPOFOL 10 MG/ML IV BOLUS
INTRAVENOUS | Status: DC | PRN
  Administered 2024-04-19: 140 mg via INTRAVENOUS

## 2024-04-19 MED ORDER — ACETAMINOPHEN 10 MG/ML IV SOLN: 1000.0000 mg | Freq: Once | INTRAVENOUS | Status: DC | PRN

## 2024-04-19 MED ORDER — MENTHOL 3 MG MT LOZG: 1.0000 | LOZENGE | OROMUCOSAL | Status: DC | PRN

## 2024-04-19 MED ORDER — KETAMINE HCL 50 MG/5ML IJ SOSY
PREFILLED_SYRINGE | INTRAMUSCULAR | Status: AC
  Filled 2024-04-19: qty 5

## 2024-04-19 MED ORDER — PROPOFOL 10 MG/ML IV BOLUS
INTRAVENOUS | Status: AC
  Filled 2024-04-19: qty 20

## 2024-04-19 MED ORDER — SUGAMMADEX SODIUM 200 MG/2ML IV SOLN
INTRAVENOUS | Status: DC | PRN
  Administered 2024-04-19: 200 mg via INTRAVENOUS

## 2024-04-19 MED ORDER — PHENYLEPHRINE 80 MCG/ML (10ML) SYRINGE FOR IV PUSH (FOR BLOOD PRESSURE SUPPORT)
PREFILLED_SYRINGE | INTRAVENOUS | Status: DC | PRN
  Administered 2024-04-19: 80 ug via INTRAVENOUS
  Administered 2024-04-19: 160 ug via INTRAVENOUS

## 2024-04-19 MED ORDER — ASPIRIN 325 MG PO TBEC
325.0000 mg | DELAYED_RELEASE_TABLET | Freq: Every day | ORAL | Status: DC
Start: 2024-04-20 — End: 2024-04-22
  Administered 2024-04-20 – 2024-04-22 (×3): 325 mg via ORAL
  Filled 2024-04-19 (×3): qty 1

## 2024-04-19 MED ORDER — OXYCODONE HCL 5 MG PO TABS: 5.0000 mg | ORAL_TABLET | ORAL | 0 refills | Status: DC | PRN

## 2024-04-19 MED ORDER — ASPIRIN 81 MG PO CHEW
81.0000 mg | CHEWABLE_TABLET | Freq: Two times a day (BID) | ORAL | 0 refills | Status: DC
Start: 2024-04-19 — End: 2024-05-26

## 2024-04-19 MED ORDER — CHLORHEXIDINE GLUCONATE 0.12 % MT SOLN
15.0000 mL | Freq: Once | OROMUCOSAL | Status: AC
  Administered 2024-04-19: 15 mL via OROMUCOSAL

## 2024-04-19 MED ORDER — INSULIN GLARGINE (1 UNIT DIAL) 300 UNIT/ML ~~LOC~~ SOPN: 21.0000 [IU] | PEN_INJECTOR | Freq: Every day | SUBCUTANEOUS | Status: DC

## 2024-04-19 MED ORDER — 0.9 % SODIUM CHLORIDE (POUR BTL) OPTIME
TOPICAL | Status: DC | PRN
  Administered 2024-04-19: 1000 mL

## 2024-04-19 MED ORDER — PHENOL 1.4 % MT LIQD: 1.0000 | OROMUCOSAL | Status: DC | PRN

## 2024-04-19 MED ORDER — EPHEDRINE SULFATE (PRESSORS) 50 MG/ML IJ SOLN
INTRAMUSCULAR | Status: DC | PRN
  Administered 2024-04-19: 10 mg via INTRAVENOUS

## 2024-04-19 MED ORDER — CEFAZOLIN SODIUM-DEXTROSE 2-4 GM/100ML-% IV SOLN
2.0000 g | INTRAVENOUS | Status: AC
  Administered 2024-04-19: 2 g via INTRAVENOUS
  Filled 2024-04-19: qty 100

## 2024-04-19 SURGICAL SUPPLY — 40 items
BAG COUNTER SPONGE SURGICOUNT (BAG) IMPLANT
BAG ZIPLOCK 12X15 (MISCELLANEOUS) IMPLANT
BIT DRILL 4.3MMS DISTAL GRDTED (BIT) IMPLANT
CHLORAPREP W/TINT 26 (MISCELLANEOUS) ×1 IMPLANT
COVER PERINEAL POST (MISCELLANEOUS) ×1 IMPLANT
COVER SURGICAL LIGHT HANDLE (MISCELLANEOUS) ×1 IMPLANT
DERMABOND ADVANCED .7 DNX12 (GAUZE/BANDAGES/DRESSINGS) ×1 IMPLANT
DRAPE C-ARM 42X120 X-RAY (DRAPES) ×1 IMPLANT
DRAPE C-ARMOR (DRAPES) ×1 IMPLANT
DRAPE IMP U-DRAPE 54X76 (DRAPES) ×2 IMPLANT
DRAPE SHEET LG 3/4 BI-LAMINATE (DRAPES) ×3 IMPLANT
DRAPE STERI IOBAN 125X83 (DRAPES) ×1 IMPLANT
DRAPE U-SHAPE 47X51 STRL (DRAPES) ×2 IMPLANT
DRSG AQUACEL AG 3.5X4 (GAUZE/BANDAGES/DRESSINGS) ×1 IMPLANT
DRSG AQUACEL AG ADV 3.5X 4 (GAUZE/BANDAGES/DRESSINGS) IMPLANT
DRSG AQUACEL AG ADV 3.5X 6 (GAUZE/BANDAGES/DRESSINGS) ×1 IMPLANT
GAUZE SPONGE 4X4 12PLY STRL (GAUZE/BANDAGES/DRESSINGS) ×1 IMPLANT
GLOVE BIO SURGEON STRL SZ7 (GLOVE) ×1 IMPLANT
GLOVE BIO SURGEON STRL SZ8.5 (GLOVE) ×2 IMPLANT
GLOVE BIOGEL PI IND STRL 7.5 (GLOVE) ×1 IMPLANT
GLOVE BIOGEL PI IND STRL 8.5 (GLOVE) ×1 IMPLANT
GOWN SPEC L3 XXLG W/TWL (GOWN DISPOSABLE) ×1 IMPLANT
GOWN STRL REUS W/ TWL XL LVL3 (GOWN DISPOSABLE) ×1 IMPLANT
GUIDEPIN VERSANAIL DSP 3.2X444 (ORTHOPEDIC DISPOSABLE SUPPLIES) IMPLANT
GUIDEWIRE BALL NOSE 100CM (WIRE) IMPLANT
HOOD PEEL AWAY T7 (MISCELLANEOUS) ×1 IMPLANT
KIT BASIN OR (CUSTOM PROCEDURE TRAY) ×1 IMPLANT
KIT TURNOVER KIT A (KITS) ×1 IMPLANT
MANIFOLD NEPTUNE II (INSTRUMENTS) ×1 IMPLANT
MARKER SKIN DUAL TIP RULER LAB (MISCELLANEOUS) ×1 IMPLANT
NAIL HIP FRACTURE 11X440 125 (Nail) IMPLANT
PACK GENERAL/GYN (CUSTOM PROCEDURE TRAY) ×1 IMPLANT
SCREW BONE CORTICAL 5.0X40 (Screw) IMPLANT
SCREW LAG HIP FR NAIL 10.5X110 (Orthopedic Implant) IMPLANT
STAPLER SKIN PROX 35W (STAPLE) IMPLANT
SUT MNCRL AB 3-0 PS2 18 (SUTURE) ×1 IMPLANT
SUT MON AB 2-0 CT1 36 (SUTURE) ×1 IMPLANT
SUT VIC AB 1 CT1 36 (SUTURE) ×1 IMPLANT
TOWEL GREEN STERILE FF (TOWEL DISPOSABLE) ×1 IMPLANT
TOWEL OR 17X26 10 PK STRL BLUE (TOWEL DISPOSABLE) ×1 IMPLANT

## 2024-04-19 NOTE — Progress Notes (Signed)
 PT Note  Patient Details Name: Samuel Ramos. MRN: [id number] DOB: September 10, 1940   Cancelled Treatment:    Reason Eval/Treat Not Completed: Other (comment).  Pt sustained R intertrochanteric femur fx s/p fall 10/7, orthopedics consulted and pt scheduled for surgery 10/9. Please re-order PT following surgical intervention. Thank you.   [place], PT Acute Rehab   [place] VEAR Drone 04/19/2024, 10:09 AM

## 2024-04-19 NOTE — Consult Note (Signed)
 ORTHOPAEDIC CONSULTATION  REQUESTING PHYSICIAN: Vernon Ranks, MD  PCP:  Charlott Dorn LABOR, MD  Chief Complaint: right hip pain, fall.  HPI: Samuel Ramos. is a 83 y.o. male with PMH of type 2 diabetes, who presented to the ER after a mechanical fall yesterday.  The patient was walking his dog when he tripped and fell on his right side. He had immediate right hip pain and inability to ambulate, he was on the ground for at least an hour. He denies hitting his head or LOC. X-rays showed right IT fracture with extension into proximal femur. Orthopedics consulted for management. He denies any tingling or numbness in LE bilaterally. He only reports right hip pain and thigh pain. He denies any blood thinner use.   He lives at [place] with his wife. He normally ambulates without any assistive devices and walks regularly. 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 He does have thrombocytopenia platelets 127 today.    Past Medical History:  Diagnosis Date   BPH (benign prostatic hyperplasia)    Cancer (HCC)    melanoma left arm and back removed   Diabetes mellitus without complication (HCC)    Type II   Diverticulitis    Pancreatitis    many years ago   Renal stone    Past Surgical History:  Procedure Laterality Date   LUMBAR LAMINECTOMY/DECOMPRESSION MICRODISCECTOMY Left 05/04/2018   Procedure: LEFT-SIDED LUMBAR 4-5 MICRODISECTOMY;  Surgeon: Beuford Anes, MD;  Location: [facility] OR;  Service: Orthopedics;  Laterality: Left;   PROSTATECTOMY N/A 01/21/2015   Procedure: PROSTATECTOMY RETROPUBIC;  Surgeon: Arlena Gal, MD;  Location: [facility];  Service: Urology;  Laterality: N/A;   right leg broken (tibia and fibula) 1961     SHOULDER ARTHROSCOPY WITH OPEN ROTATOR CUFF REPAIR  2002   right   TONSILLECTOMY     WISDOM TOOTH EXTRACTION     Social History   Socioeconomic History   Marital status: Married    Spouse name: Not on file   Number of children:  Not on file   Years of education: Not on file   Highest education level: Not on file  Occupational History   Not on file  Tobacco Use   Smoking status: Never   Smokeless tobacco: Never  Vaping Use   Vaping status: Never Used  Substance and Sexual Activity   Alcohol use: Yes    Comment: occasional   Drug use: No   Sexual activity: Not on file  Other Topics Concern   Not on file  Social History Narrative   Not on file   Social Drivers of Health   Financial Resource Strain: Not on file  Food Insecurity: No Food Insecurity (04/19/2024)   Hunger Vital Sign    Worried About Running Out of Food in the Last Year: Never true    Ran Out of Food in the Last Year: Never true  Transportation Needs: No Transportation Needs (04/19/2024)   PRAPARE - Administrator, Civil Service (Medical): No    Lack of Transportation (Non-Medical): No  Physical Activity: Not on file  Stress: Not on file  Social Connections: Socially Integrated (04/19/2024)   Social Connection and Isolation Panel    Frequency of Communication with Friends and Family: Three times a week    Frequency of Social Gatherings with Friends and Family: More than three times a week    Attends Religious Services: More than 4 times per year  Active Member of Clubs or Organizations: Yes    Attends Engineer, structural: More than 4 times per year    Marital Status: Married   Family History  Problem Relation Age of Onset   Melanoma Father    Lymphoma Sister    Uterine cancer Mother    Pancreatitis Neg Hx    Allergies  Allergen Reactions   Januvia [Sitagliptin] Other (See Comments)    Pancreatitis    Gabapentin Other (See Comments)    Fatigue and cramps    Vicodin [Hydrocodone-Acetaminophen ] Other (See Comments)    Nightmares   Prior to Admission medications   Medication Sig Start Date End Date Taking? Authorizing Provider  amoxicillin  (AMOXIL ) 500 MG capsule Take 2 capsules by mouth now then take 1  capsule (500 mg total) by mouth 3 (three) times daily until gone 11/10/22     bisacodyl (DULCOLAX) 5 MG EC tablet Take 5 mg by mouth daily.    [provider]  Blood Glucose Monitoring Suppl (ONE TOUCH ULTRA 2) w/Device KIT Use as directed 11/24/23     dapagliflozin  propanediol (FARXIGA ) 10 MG TABS tablet Take 1 tablet (10 mg total) by mouth daily. 12/03/23     Evolocumab  (REPATHA  SURECLICK) 140 MG/ML SOAJ Inject 140 mg into the skin every 14 (fourteen) days. 02/03/24     ezetimibe  (ZETIA ) 10 MG tablet Take 1 tablet (10 mg total) by mouth daily. 03/24/24     ferrous sulfate 325 (65 FE) MG EC tablet Take 325 mg by mouth 2 (two) times daily.    [provider]  fluconazole  (DIFLUCAN ) 100 MG tablet Take 1 tablet (100 mg total) by mouth daily. 04/08/22     glimepiride  (AMARYL ) 4 MG tablet Take 4 mg by mouth 2 (two) times daily.    [provider]  glimepiride  (AMARYL ) 4 MG tablet Take 1 tablet (4 mg total) by mouth daily with breakfast or first main meal of the day. 12/16/23     glimepiride  (AMARYL ) 4 MG tablet Take 1 tablet (4 mg total) by mouth with breakfast or the first main meal of the day. 12/28/23     glucose blood (ONETOUCH ULTRA) test strip TEST UP TO FOUR TIMES DAILY. 02/28/24     insulin  glargine, 1 Unit Dial , (TOUJEO  SOLOSTAR) 300 UNIT/ML Solostar Pen Inject 21 Units into the skin daily. 02/18/24     insulin  glargine, 1 Unit Dial , (TOUJEO ) 300 UNIT/ML Solostar Pen Inject 21 Units into the skin daily. 01/22/23     Insulin  Pen Needle (INSUPEN PEN NEEDLES) 32G X 4 MM MISC Use to check blood sugar once daily 06/07/23     ketoconazole  (NIZORAL ) 2 % cream Apply to affected area(s) twice daily for 14 days. 04/07/22     Lancets (ONETOUCH DELICA PLUS LANCET30G) MISC Use upto 2 (two) times daily to check blood sugar 11/24/23     lisinopril (PRINIVIL,ZESTRIL) 5 MG tablet Take 5 mg by mouth every evening.    [provider]  Melatonin 10 MG TABS Take 10 mg by mouth at bedtime.     [provider]  meloxicam  (MOBIC ) 15 MG tablet Take 1 tablet (15 mg total) by mouth daily  with food for inflammation 11/12/23     metFORMIN  (GLUCOPHAGE -XR) 500 MG 24 hr tablet Take 1,000 mg by mouth 2 (two) times daily.    [provider]  metFORMIN  (GLUCOPHAGE -XR) 500 MG 24 hr tablet Take 2 tablets (1,000 mg total) by mouth 2 (two) times daily at 8  am and 10 pm. 03/24/24     methocarbamol  (ROBAXIN ) 500 MG tablet Take 1-2 tablets (500-1,000 mg total) by mouth every 6 (six) to 8 (hours) as needed for muscle spasms 11/12/23     methylPREDNISolone  (MEDROL ) 4 MG TBPK tablet Take as prescribed on dose pack over 6 days 11/12/23     mupirocin  ointment (BACTROBAN ) 2 % Apply a thin layer to the inside of each nostril twice a day as directed 02/01/24     saccharomyces boulardii (FLORASTOR) 250 MG capsule Take 250 mg by mouth daily.    [provider]  traMADol  (ULTRAM ) 50 MG tablet Take 1 tablet (50 mg total) by mouth every 6 (six) hours as needed for pain. 11/24/22     valACYclovir  (VALTREX ) 500 MG tablet Take 1 tablet (500 mg total) by mouth daily as needed. 03/17/23     valACYclovir  (VALTREX ) 500 MG tablet Take 1 tablet (500 mg total) by mouth daily as needed for up to 5 days. 02/15/24      CT Hip Right Wo Contrast Result Date: 04/18/2024 EXAM: CT OF THE RIGHT HIP WITHOUT IV CONTRAST 04/18/2024 10: 58:43 PM TECHNIQUE: CT of the right hip was performed without the administration of intravenous contrast. Multiplanar reformatted images are provided for review. Automated exposure control, iterative reconstruction, and/or weight based adjustment of the mA/kV was utilized to reduce the radiation dose to as low as reasonably achievable. COMPARISON: None available. CLINICAL HISTORY: Right hip fracture, operative planning. Per chart: Patient brought in from home, had a trip and fall while walking his dog, falling onto his right side. Presents with right hip pain and radiating pain down the leg.  FINDINGS: BONES: There is an acute, comminuted, impacted fracture of the intertrochanteric/subtrochanteric region of the right hip with avulsion of the lesser trochanter. The dominant fracture planes extend obliquely within the intertrochanteric region, extending into the posterior cortex of the proximal femoral diaphysis, best seen on sagittal image 45, series 9. There is impaction at the intertrochanteric fracture plane with moderate varus angulation. SOFT TISSUE: Mild vascular calcifications are noted. JOINT: The femoral head is still seated within the right acetabulum with moderate superimposed degenerative arthritis of the right hip. INTRAPELVIC CONTENTS: Incidental note of distal colonic diverticulosis. IMPRESSION: 1. Acute, comminuted, impacted fracture of the intertrochanteric/subtrochanteric region of the right hip with avulsion of the lesser trochanter and moderate varus angulation. 2. Moderate degenerative arthritis of the right hip. Electronically signed by: Dorethia Molt MD 04/18/2024 11:06 PM EDT RP Workstation: [id number]   DG Lumbar Spine 2-3 Views Result Date: 04/18/2024 CLINICAL DATA:  [id number] Fall [id number] EXAM: LUMBAR SPINE - 2-3 VIEW COMPARISON:  X-ray lumbar spine 05/04/2018 FINDINGS: There is no evidence of lumbar spine fracture. Multilevel moderate degenerative change of the spine. Alignment is normal. Intervertebral disc spaces are maintained. Atherosclerotic plaque. IMPRESSION: No acute displaced fracture or traumatic listhesis of the lumbar spine. Electronically Signed   By: Morgane  Naveau M.D.   On: 04/18/2024 20:41   DG Chest Port 1 View Result Date: 04/18/2024 CLINICAL DATA:  fall, pain Pt fell while walking his dog this evening, pt fell on right hip. Pt c/o right hip pain radiating down leg. EXAM: PORTABLE CHEST 1 VIEW COMPARISON:  Chest x-ray 08/19/2022. FINDINGS: The heart and mediastinal contours are unchanged. Atherosclerotic plaque. No focal consolidation. No pulmonary edema.  No pleural effusion. No pneumothorax. No acute osseous abnormality. IMPRESSION: 1. No active disease. 2.  Aortic Atherosclerosis (ICD10-I70.0). Electronically Signed   By: Morgane  Naveau M.D.  On: 04/18/2024 20:38   DG Hip Unilat With Pelvis 2-3 Views Right Result Date: 04/18/2024 CLINICAL DATA:  fall, pain EXAM: DG HIP (WITH OR WITHOUT PELVIS) 2-3V RIGHT COMPARISON:  CT abdomen pelvis 01/06/2013 FINDINGS: Acute displaced right intratrochanteric femoral fracture extending to the proximal femoral shaft. No right hip dislocation. No acute displaced fracture or dislocation of the left hip on frontal view. Question avascular necrosis of the left femoral head. There is no evidence of severe arthropathy or other focal bone abnormality. IMPRESSION: Acute displaced right intratrochanteric femoral fracture extending to the proximal femoral shaft. Electronically Signed   By: Morgane  Naveau M.D.   On: 04/18/2024 20:36    Positive ROS: All other systems have been reviewed and were otherwise negative with the exception of those mentioned in the HPI and as above.  Physical Exam: General: Alert, no acute distress Cardiovascular: No pedal edema Respiratory: No cyanosis, no use of accessory musculature GI: No organomegaly, abdomen is soft and non-tender Skin: No lesions in the area of chief complaint Neurologic: Sensation intact distally Psychiatric: Patient is competent for consent with normal mood and affect Lymphatic: No axillary or cervical lymphadenopathy  MUSCULOSKELETAL:  Examination of the right hip reveals no skin wounds or lesions. Contusion lateral hip. TTP over trochanteric region. Pain with hip movement. No TTP over right knee. Shortening and ER noted. Sensation intact to light touch. Motor function intact in LE bilaterally including plantar flexion, dorsiflexion, and EHL. Palpable distal pedal pulses. No significant pedal edema. Compartments soft.   Assessment: Acute closed right IT fracture  with extension into proximal femur.  T2DM. Thrombocytopenia, donated platelets yesterday.    Plan: I discussed the findings with the patient, wife, and son. He has an unstable right femur fracture that will require surgical fixation to allow immediate mobilization out of bed and pain control. [facility] has already admitted patient for perioperative medical optimization, appreciate their consult. Plan for right IM fixation today pending OR scheduling and planning. Patient to remain NPO. Continue to hold chemical DVT ppx. Knee x-ray ordered. All question solicited and answered.     Valery GORMAN Potters, PA-C    04/19/2024 8:33 AM

## 2024-04-19 NOTE — Op Note (Signed)
 OPERATIVE REPORT  SURGEON: Redell Shoals, MD   ASSISTANT: Staff, PA-C.  PREOPERATIVE DIAGNOSIS: Comminuted Right peritrochanteric femur fracture.   POSTOPERATIVE DIAGNOSIS: Comminuted Right peritrochanteric femur fracture.   PROCEDURE: Intramedullary fixation, Right femur.   IMPLANTS: Biomet Affixus Hip Fracture Nail, 11 by 440 mm, 125 degrees. 10.5 x 110 mm Hip Fracture Nail Lag Screw. 5 x 40 mm distal interlocking screw 1.  ANESTHESIA:  General  ESTIMATED BLOOD LOSS:-25 mL    ANTIBIOTICS:  2 g Ancef .  DRAINS: None.  COMPLICATIONS: None.   CONDITION: PACU - hemodynamically stable.[name]   BRIEF CLINICAL NOTE: Samuel Ramos. is a 83 y.o. male who presented with an intertrochanteric femur fracture. The patient was admitted to the hospitalist service and underwent perioperative risk stratification and medical optimization. The risks, benefits, and alternatives to the procedure were explained, and the patient elected to proceed.  PROCEDURE IN DETAIL: Surgical site was marked by myself. The patient was taken to the operating room and anesthesia was induced on the bed. The patient was then transferred to the [facility] table and the nonoperative lower extremity was scissored underneath the operative side. The fracture was reduced with traction, internal rotation, and adduction. The hip was prepped and draped in the normal sterile surgical fashion. Timeout was called verifying side and site of surgery. Preop antibiotics were given with 60 minutes of beginning the procedure.  Fluoroscopy was used to define the patient's anatomy. A 4 cm incision was made just proximal to the tip of the greater trochanter. The awl was used to obtain the standard starting point for a trochanteric entry nail under fluoroscopic control. The guidepin was placed. The entry reamer was used to open the proximal femur.  I placed the guidewire to the level of the physeal scar of the knee. I measured the length of the  guidewire. Sequential reaming was performed up to a size 12.5 mm with adequate chatter. Therefore, a size 11 by 440 mm nail was selected and assembled to the jig on the back table. The nail was placed without any difficulty. Through a separate stab incision, the cannula was placed down to the bone in preparation for the cephalomedullary device. A guidepin was placed into the femoral head using AP and lateral fluoroscopy views. The pin was measured, and then reaming was performed to the appropriate depth. The lag screw was inserted to the appropriate depth. The fracture was compressed through the jig. The setscrew was tightened statically. Using perfect circle technique, a distal interlocking screw was placed. The jig was removed. Final AP and lateral fluoroscopy views were obtained to confirm fracture reduction and hardware placement. Tip apex distance was appropriate. There was no chondral penetration.  The wounds were copiously irrigated with saline. The wound was closed in layers with #1 Vicryl for the fascia, 2-0 Monocryl for the deep dermal layer, and 3-0 Monocryl subcuticular stitch. Glue was applied to the skin. Once the glue was fully hardened, sterile dressing was applied. The patient was then awakened from anesthesia and taken to the PACU in stable condition. Sponge needle and instrument counts were correct at the end of the case 2. There were no known complications.  We will readmit the patient to the hospitalist. Weightbearing status will be 50% weightbearing RLE with a walker. We will begin Lovenox  for DVT prophylaxis. The patient will work with physical therapy and undergo disposition planning.  Please note that a surgical assistant was a medical necessity for this procedure to perform it in a safe  and expeditious manner. Assistant was necessary to provide appropriate retraction of vital neurovascular structures, to prevent femoral fracture, and to allow for anatomic placement of the  prosthesis.

## 2024-04-19 NOTE — Anesthesia Procedure Notes (Signed)
 Procedure Name: Intubation Date/Time: 04/19/2024 5:53 PM  Performed by: Lanning Cena RAMAN, CRNAPre-anesthesia Checklist: Patient identified, Emergency Drugs available, Suction available, Patient being monitored and Timeout performed Patient Re-evaluated:Patient Re-evaluated prior to induction Oxygen Delivery Method: Circle system utilized Preoxygenation: Pre-oxygenation with 100% oxygen Induction Type: IV induction Ventilation: Mask ventilation without difficulty Laryngoscope Size: Miller and 3 Grade View: Grade II Tube type: Oral Tube size: 8.0 mm Number of attempts: 1 Airway Equipment and Method: Stylet Placement Confirmation: ETT inserted through vocal cords under direct vision, positive ETCO2, CO2 detector and breath sounds checked- equal and bilateral Secured at: 23 cm Tube secured with: Tape Dental Injury: Teeth and Oropharynx as per pre-operative assessment

## 2024-04-19 NOTE — Transfer of Care (Signed)
 Immediate Anesthesia Transfer of Care Note  Patient: Samuel Ramos.  Procedure(s) Performed: FIXATION, FRACTURE, INTERTROCHANTERIC, WITH INTRAMEDULLARY ROD (Right: Hip)  Patient Location: PACU  Anesthesia Type:General  Level of Consciousness: awake and patient cooperative  Airway & Oxygen Therapy: Patient Spontanous Breathing and Patient connected to face mask oxygen  Post-op Assessment: Report given to RN and Post -op Vital signs reviewed and stable  Post vital signs: Reviewed and stable  Last Vitals:  Vitals Value Taken Time  BP    Temp    Pulse 91 04/19/24 19:48  Resp 9 04/19/24 19:48  SpO2 100 % 04/19/24 19:48  Vitals shown include unfiled device data.  Last Pain:  Vitals:   04/19/24 1539  TempSrc:   PainSc: 5       Patients Stated Pain Goal: 0 (04/19/24 1055)  Complications: No notable events documented.

## 2024-04-19 NOTE — Progress Notes (Addendum)
 PROGRESS NOTE    Samuel Ramos.  FMW:[id number] DOB: 1940-11-11 DOA: 04/18/2024 PCP: Charlott Dorn LABOR, MD   Brief Narrative:  HPI: Samuel Ramos. is a 83 y.o. male with medical history significant for type 2 diabetes, hyperlipidemia, prior history of tibia/fibula fracture in his 11s, who presents to the ER after a mechanical fall.  The patient was walking his dog when he tripped and fell.  He was in his normal state of health prior to this.  He landed on his right side.  He was on the ground for hours.  He could not get back up without assistance.   In the ER, with significant pain in the right hip which is shortened and externally rotated.  Right hip x-ray showing acute displaced right intratrochanteric femoral fracture extending to the proximal femoral shaft.   EDP discussed the case with orthopedic surgery, Dr. Fidel.  N.p.o. after midnight, until seen by orthopedic surgery tomorrow.  Admitted for surgical repair of acute displaced right intertrochanteric femoral fracture extending to the proximal femoral shaft.     ED Course: Temperature 98.  BP 117/66, pulse 90, respiratory rate 11, O2 saturation 97% on room air.  Assessment & Plan:   Principal Problem:   Trochanteric fracture (HCC)  Acute displaced right intratrochanteric femoral fracture extending to the proximal femoral shaft secondary to mechanical fall. Orthopedic surgery on board.  Plan for surgery today.  Pain is controlled.  Management per orthopedics.  Uncontrolled type 2 diabetes with hyperglycemia: Last hemoglobin A1c 9.2 in 2019.  And hemoglobin A1c 9.0 here.  Appears to be on multiple oral medications as well as Lantus  at home.  He is only on SSI and is hyperglycemic.  Will resume home dose of Lantus  21 units now.  Hold oral diabetic medications for now.   Hyperlipidemia  Zetia    Chronic intermittent thrombocytopenia Platelet count 127 Monitor for now.  CBC pending today.  DVT prophylaxis: SCDs  Start: 04/18/24 2203   Code Status: Full Code  Family Communication:- wife present at bedside.  Plan of care discussed with patient in length and he/she verbalized understanding and agreed with it.  Status is: Inpatient Remains inpatient appropriate because: Scheduled for hip surgery today.   Estimated body mass index is 23.44 kg/m as calculated from the following:   Height as of this encounter: 6' 2 (1.88 m).   Weight as of this encounter: 82.8 kg.    Nutritional Assessment: Body mass index is 23.44 kg/m.[name] Seen by dietician.  I agree with the assessment and plan as outlined below: Nutrition Status:        . Skin Assessment: I have examined the patient's skin and I agree with the wound assessment as performed by the wound care RN as outlined below:    Consultants:  Orthopedics  Procedures:  As above  Antimicrobials:  Anti-infectives (From admission, onward)    None         Subjective: Patient seen and examined, other than mild right hip pain, no other complaint.  Wife at the bedside.  Objective: Vitals:   04/19/24 0027 04/19/24 0030 04/19/24 0113 04/19/24 0409  BP: 124/68   (!) 118/59  Pulse: (!) 49  90 93  Resp:    18  Temp: 98 F (36.7 C)   97.8 F (36.6 C)  TempSrc:    Oral  SpO2: 100%  99% 100%  Weight:  82.8 kg    Height:  6' 2 (1.88 m)  Intake/Output Summary (Last 24 hours) at 04/19/2024 0957 Last data filed at 04/19/2024 0522 Gross per 24 hour  Intake --  Output 385 ml  Net -385 ml   Filed Weights   04/19/24 0030  Weight: 82.8 kg    Examination:  General exam: Appears calm and comfortable  Respiratory system: Clear to auscultation. Respiratory effort normal. Cardiovascular system: S1 & S2 heard, RRR. No JVD, murmurs, rubs, gallops or clicks. No pedal edema. Gastrointestinal system: Abdomen is nondistended, soft and nontender. No organomegaly or masses felt. Normal bowel sounds heard. Central nervous system: Alert and oriented.  No focal neurological deficits. Skin: No rashes, lesions or ulcers Psychiatry: Judgement and insight appear normal. Mood & affect appropriate.    Data Reviewed: I have personally reviewed following labs and imaging studies  CBC: Recent Labs  Lab 04/18/24 2037  WBC 9.3  NEUTROABS 8.1*  HGB 10.2*  HCT 34.4*  MCV 84.1  PLT 127*   Basic Metabolic Panel: Recent Labs  Lab 04/18/24 2037  NA 140  K 4.0  CL 105  CO2 24  GLUCOSE 224*  BUN 11  CREATININE 0.90  CALCIUM 8.5*   GFR: Estimated Creatinine Clearance: 74.8 mL/min (by C-G formula based on SCr of 0.9 mg/dL). Liver Function Tests: No results for input(s): AST, ALT, ALKPHOS, BILITOT, PROT, ALBUMIN in the last 168 hours. No results for input(s): LIPASE, AMYLASE in the last 168 hours. No results for input(s): AMMONIA in the last 168 hours. Coagulation Profile: Recent Labs  Lab 04/18/24 2037  INR 0.9   Cardiac Enzymes: No results for input(s): CKTOTAL, CKMB, CKMBINDEX, TROPONINI in the last 168 hours. BNP (last 3 results) No results for input(s): PROBNP in the last 8760 hours. HbA1C: Recent Labs    04/19/24 0050  HGBA1C 9.0*   CBG: Recent Labs  Lab 04/19/24 0031 04/19/24 0410 04/19/24 0809  GLUCAP 211* 240* 216*   Lipid Profile: No results for input(s): CHOL, HDL, LDLCALC, TRIG, CHOLHDL, LDLDIRECT in the last 72 hours. Thyroid Function Tests: No results for input(s): TSH, T4TOTAL, FREET4, T3FREE, THYROIDAB in the last 72 hours. Anemia Panel: No results for input(s): VITAMINB12, FOLATE, FERRITIN, TIBC, IRON, RETICCTPCT in the last 72 hours. Sepsis Labs: No results for input(s): PROCALCITON, LATICACIDVEN in the last 168 hours.  No results found for this or any previous visit (from the past 240 hours).   Radiology Studies: DG Knee Right Port Result Date: 04/19/2024 CLINICAL DATA:  Right hip fracture. EXAM: PORTABLE RIGHT KNEE - 1-2 VIEW  COMPARISON:  04/18/2024. FINDINGS: No acute osseous or joint abnormality. Degenerative changes in the knee. IMPRESSION: No acute findings. Electronically Signed   By: Newell Eke M.D.   On: 04/19/2024 09:14   CT Hip Right Wo Contrast Result Date: 04/18/2024 EXAM: CT OF THE RIGHT HIP WITHOUT IV CONTRAST 04/18/2024 10: 58:43 PM TECHNIQUE: CT of the right hip was performed without the administration of intravenous contrast. Multiplanar reformatted images are provided for review. Automated exposure control, iterative reconstruction, and/or weight based adjustment of the mA/kV was utilized to reduce the radiation dose to as low as reasonably achievable. COMPARISON: None available. CLINICAL HISTORY: Right hip fracture, operative planning. Per chart: Patient brought in from home, had a trip and fall while walking his dog, falling onto his right side. Presents with right hip pain and radiating pain down the leg. FINDINGS: BONES: There is an acute, comminuted, impacted fracture of the intertrochanteric/subtrochanteric region of the right hip with avulsion of the lesser trochanter. The dominant fracture  planes extend obliquely within the intertrochanteric region, extending into the posterior cortex of the proximal femoral diaphysis, best seen on sagittal image 45, series 9. There is impaction at the intertrochanteric fracture plane with moderate varus angulation. SOFT TISSUE: Mild vascular calcifications are noted. JOINT: The femoral head is still seated within the right acetabulum with moderate superimposed degenerative arthritis of the right hip. INTRAPELVIC CONTENTS: Incidental note of distal colonic diverticulosis. IMPRESSION: 1. Acute, comminuted, impacted fracture of the intertrochanteric/subtrochanteric region of the right hip with avulsion of the lesser trochanter and moderate varus angulation. 2. Moderate degenerative arthritis of the right hip. Electronically signed by: Dorethia Molt MD 04/18/2024 11:06 PM  EDT RP Workstation: [id number]   DG Lumbar Spine 2-3 Views Result Date: 04/18/2024 CLINICAL DATA:  [id number] Fall [id number] EXAM: LUMBAR SPINE - 2-3 VIEW COMPARISON:  X-ray lumbar spine 05/04/2018 FINDINGS: There is no evidence of lumbar spine fracture. Multilevel moderate degenerative change of the spine. Alignment is normal. Intervertebral disc spaces are maintained. Atherosclerotic plaque. IMPRESSION: No acute displaced fracture or traumatic listhesis of the lumbar spine. Electronically Signed   By: Morgane  Naveau M.D.   On: 04/18/2024 20:41   DG Chest Port 1 View Result Date: 04/18/2024 CLINICAL DATA:  fall, pain Pt fell while walking his dog this evening, pt fell on right hip. Pt c/o right hip pain radiating down leg. EXAM: PORTABLE CHEST 1 VIEW COMPARISON:  Chest x-ray 08/19/2022. FINDINGS: The heart and mediastinal contours are unchanged. Atherosclerotic plaque. No focal consolidation. No pulmonary edema. No pleural effusion. No pneumothorax. No acute osseous abnormality. IMPRESSION: 1. No active disease. 2.  Aortic Atherosclerosis (ICD10-I70.0). Electronically Signed   By: Morgane  Naveau M.D.   On: 04/18/2024 20:38   DG Hip Unilat With Pelvis 2-3 Views Right Result Date: 04/18/2024 CLINICAL DATA:  fall, pain EXAM: DG HIP (WITH OR WITHOUT PELVIS) 2-3V RIGHT COMPARISON:  CT abdomen pelvis 01/06/2013 FINDINGS: Acute displaced right intratrochanteric femoral fracture extending to the proximal femoral shaft. No right hip dislocation. No acute displaced fracture or dislocation of the left hip on frontal view. Question avascular necrosis of the left femoral head. There is no evidence of severe arthropathy or other focal bone abnormality. IMPRESSION: Acute displaced right intratrochanteric femoral fracture extending to the proximal femoral shaft. Electronically Signed   By: Morgane  Naveau M.D.   On: 04/18/2024 20:36    Scheduled Meds:  ezetimibe   10 mg Oral Daily   insulin  aspart  0-5 Units Subcutaneous  QHS   insulin  aspart  0-9 Units Subcutaneous TID WC   insulin  glargine  21 Units Subcutaneous Daily   methocarbamol   500 mg Oral Once   Continuous Infusions:  lactated ringers  50 mL/hr at 04/18/24 2214     LOS: 1 day   Fredia Skeeter, MD [facility]  04/19/2024, 9:57 AM   *Please note that this is a verbal dictation therefore any spelling or grammatical errors are due to the Dragon Medical One system interpretation.  Please page via Amion and do not message via secure chat for urgent patient care matters. Secure chat can be used for non urgent patient care matters.  How to contact the [facility] Attending or Consulting provider 7A - 7P or covering provider during after hours 7P -7A, for this patient?  Check the care team in [facility] and look for a) attending/consulting [facility] provider listed and b) the [facility] team listed. Page or secure chat 7A-7P. Log into [email or web address] and use [facility] universal password to access. If you do not  have the password, please contact the hospital operator. Locate the [facility] provider you are looking for under [facility] and page to a number that you can be directly reached. If you still have difficulty reaching the provider, please page the [facility] (Director on Call) for the Hospitalists listed on amion for assistance.

## 2024-04-19 NOTE — Anesthesia Preprocedure Evaluation (Addendum)
 Anesthesia Evaluation  Patient identified by MRN, date of birth, ID band Patient awake    Reviewed: Allergy & Precautions, NPO status , Patient's Chart, lab work & pertinent test results  Airway Mallampati: II  TM Distance: >3 FB Neck ROM: Full    Dental  (+) Teeth Intact, Dental Advisory Given   Pulmonary neg pulmonary ROS   breath sounds clear to auscultation       Cardiovascular negative cardio ROS  Rhythm:Regular Rate:Normal     Neuro/Psych negative neurological ROS  negative psych ROS   GI/Hepatic negative GI ROS, Neg liver ROS,,,  Endo/Other  diabetes    Renal/GU Renal disease     Musculoskeletal negative musculoskeletal ROS (+)    Abdominal   Peds  Hematology  (+) Blood dyscrasia, anemia   Anesthesia Other Findings   Reproductive/Obstetrics                              Anesthesia Physical Anesthesia Plan  ASA: 3  Anesthesia Plan: General   Post-op Pain Management: Ofirmev  IV (intra-op)*   Induction: Intravenous  PONV Risk Score and Plan: 3 and Ondansetron  and Treatment may vary due to age or medical condition  Airway Management Planned: Oral ETT  Additional Equipment: None  Intra-op Plan:   Post-operative Plan: Extubation in OR  Informed Consent: I have reviewed the patients History and Physical, chart, labs and discussed the procedure including the risks, benefits and alternatives for the proposed anesthesia with the patient or authorized representative who has indicated his/her understanding and acceptance.     Dental advisory given  Plan Discussed with: CRNA  Anesthesia Plan Comments: (Lab Results      Component                Value               Date                      WBC                      7.5                 04/19/2024                HGB                      8.4 (L)             04/19/2024                HCT                      28.4 (L)             04/19/2024                MCV                      85.3                04/19/2024                PLT                      116 (L)             04/19/2024           )  Anesthesia Quick Evaluation

## 2024-04-19 NOTE — Anesthesia Postprocedure Evaluation (Signed)
 Anesthesia Post Note  Patient: Samuel Ramos.  Procedure(s) Performed: FIXATION, FRACTURE, INTERTROCHANTERIC, WITH INTRAMEDULLARY ROD (Right: Hip)     Patient location during evaluation: PACU Anesthesia Type: General Level of consciousness: awake and alert Pain management: pain level controlled Vital Signs Assessment: post-procedure vital signs reviewed and stable Respiratory status: spontaneous breathing, nonlabored ventilation, respiratory function stable and patient connected to nasal cannula oxygen Cardiovascular status: blood pressure returned to baseline and stable Postop Assessment: no apparent nausea or vomiting Anesthetic complications: no   No notable events documented.  Last Vitals:  Vitals:   04/19/24 2340 04/19/24 2355  BP: (!) 126/55 125/77  Pulse: 98 96  Resp: 16 16  Temp: 36.9 C 36.9 C  SpO2: 100% 98%    Last Pain:  Vitals:   04/19/24 2355  TempSrc: Oral  PainSc:                  Navika Hoopes

## 2024-04-19 NOTE — Discharge Instructions (Addendum)
 Dr. Redell Shoals Adult Hip & Knee Specialist [facility] [address], [address] [place], [place] [address] [phone]   POSTOPERATIVE DIRECTIONS    Hip Rehabilitation, Guidelines Following Surgery   WEIGHT BEARING 50% Weightbearing right lower extremity with walker.    HOME CARE INSTRUCTIONS  Remove items at home which could result in a fall. This includes throw rugs or furniture in walking pathways.  Continue medications as instructed at time of discharge. You may have some home medications which will be placed on hold until you complete the course of blood thinner medication. 4 days after discharge, you may start showering. No tub baths or soaking your incisions. Do not put on socks or shoes without following the instructions of your caregivers.   Sit on chairs with arms. Use the chair arms to help push yourself up when arising.  Arrange for the use of a toilet seat elevator so you are not sitting low.  Walk with walker as instructed.  You may resume a sexual relationship in one month or when given the OK by your caregiver.  Use walker as long as suggested by your caregivers.  Avoid periods of inactivity such as sitting longer than an hour when not asleep. This helps prevent blood clots.  You may return to work once you are cleared by Designer, industrial/product.  Do not drive a car for 6 weeks or until released by your surgeon.  Do not drive while taking narcotics.  Wear elastic stockings for two weeks following surgery during the day but you may remove then at night.  Make sure you keep all of your appointments after your operation with all of your doctors and caregivers. You should call the office at the above phone number and make an appointment for approximately two weeks after the date of your surgery. Please pick up a stool softener and laxative for home use as long as you are requiring pain medications. ICE to the affected hip every three hours for 30 minutes at  a time and then as needed for pain and swelling. Continue to use ice on the hip for pain and swelling from surgery. You may notice swelling that will progress down to the foot and ankle.  This is normal after surgery.  Elevate the leg when you are not up walking on it.   It is important for you to complete the blood thinner medication as prescribed by your doctor. Continue to use the breathing machine which will help keep your temperature down.  It is common for your temperature to cycle up and down following surgery, especially at night when you are not up moving around and exerting yourself.  The breathing machine keeps your lungs expanded and your temperature down.  RANGE OF MOTION AND STRENGTHENING EXERCISES  These exercises are designed to help you keep full movement of your hip joint. Follow your caregiver's or physical therapist's instructions. Perform all exercises about fifteen times, three times per day or as directed. Exercise both hips, even if you have had only one joint replacement. These exercises can be done on a training (exercise) mat, on the floor, on a table or on a bed. Use whatever works the best and is most comfortable for you. Use music or television while you are exercising so that the exercises are a pleasant break in your day. This will make your life better with the exercises acting as a break in routine you can look forward to.  Lying on your back, slowly  slide your foot toward your buttocks, raising your knee up off the floor. Then slowly slide your foot back down until your leg is straight again.  Lying on your back spread your legs as far apart as you can without causing discomfort.  Lying on your side, raise your upper leg and foot straight up from the floor as far as is comfortable. Slowly lower the leg and repeat.  Lying on your back, tighten up the muscle in the front of your thigh (quadriceps muscles). You can do this by keeping your leg straight and trying to raise your  heel off the floor. This helps strengthen the largest muscle supporting your knee.  Lying on your back, tighten up the muscles of your buttocks both with the legs straight and with the knee bent at a comfortable angle while keeping your heel on the floor.   SKILLED REHAB INSTRUCTIONS: If the patient is transferred to a skilled rehab facility following release from the hospital, a list of the current medications will be sent to the facility for the patient to continue.  When discharged from the skilled rehab facility, please have the facility set up the patient's Home Health Physical Therapy prior to being released. Also, the skilled facility will be responsible for providing the patient with their medications at time of release from the facility to include their pain medication and their blood thinner medication. If the patient is still at the rehab facility at time of the two week follow up appointment, the skilled rehab facility will also need to assist the patient in arranging follow up appointment in our office and any transportation needs.  MAKE SURE YOU:  Understand these instructions.  Will watch your condition.  Will get help right away if you are not doing well or get worse.  Pick up stool softner and laxative for home use following surgery while on pain medications. Daily dry dressing changes as needed. In 4 days, you may remove your dressings and begin taking showers - no tub baths or soaking the incisions. Continue to use ice for pain and swelling after surgery. Do not use any lotions or creams on the incision until instructed by your surgeon.

## 2024-04-20 ENCOUNTER — Other Ambulatory Visit (HOSPITAL_BASED_OUTPATIENT_CLINIC_OR_DEPARTMENT_OTHER): Payer: Self-pay

## 2024-04-20 DIAGNOSIS — S72101A Unspecified trochanteric fracture of right femur, initial encounter for closed fracture: Secondary | ICD-10-CM | POA: Diagnosis not present

## 2024-04-20 LAB — CBC WITH DIFFERENTIAL/PLATELET
Abs Immature Granulocytes: 0.03 K/uL (ref 0.00–0.07)
Basophils Absolute: 0 K/uL (ref 0.0–0.1)
Basophils Relative: 0 %
Eosinophils Absolute: 0 K/uL (ref 0.0–0.5)
Eosinophils Relative: 1 %
HCT: 30 % — ABNORMAL LOW (ref 39.0–52.0)
Hemoglobin: 9 g/dL — ABNORMAL LOW (ref 13.0–17.0)
Immature Granulocytes: 0 %
Lymphocytes Relative: 11 %
Lymphs Abs: 0.9 K/uL (ref 0.7–4.0)
MCH: 25.9 pg — ABNORMAL LOW (ref 26.0–34.0)
MCHC: 30 g/dL (ref 30.0–36.0)
MCV: 86.2 fL (ref 80.0–100.0)
Monocytes Absolute: 0.8 K/uL (ref 0.1–1.0)
Monocytes Relative: 10 %
Neutro Abs: 6.3 K/uL (ref 1.7–7.7)
Neutrophils Relative %: 78 %
Platelets: 98 K/uL — ABNORMAL LOW (ref 150–400)
RBC: 3.48 MIL/uL — ABNORMAL LOW (ref 4.22–5.81)
RDW: 14.4 % (ref 11.5–15.5)
Smear Review: NORMAL
WBC: 8 K/uL (ref 4.0–10.5)
nRBC: 0 % (ref 0.0–0.2)

## 2024-04-20 LAB — BASIC METABOLIC PANEL WITH GFR
Anion gap: 8 (ref 5–15)
BUN: 11 mg/dL (ref 8–23)
CO2: 27 mmol/L (ref 22–32)
Calcium: 8 mg/dL — ABNORMAL LOW (ref 8.9–10.3)
Chloride: 106 mmol/L (ref 98–111)
Creatinine, Ser: 0.79 mg/dL (ref 0.61–1.24)
GFR, Estimated: >60 mL/min (ref >60)
Glucose, Bld: 157 mg/dL — ABNORMAL HIGH (ref 70–99)
Potassium: 4.1 mmol/L (ref 3.5–5.1)
Sodium: 140 mmol/L (ref 135–145)

## 2024-04-20 LAB — GLUCOSE, CAPILLARY
Glucose-Capillary: 168 mg/dL — ABNORMAL HIGH (ref 70–99)
Glucose-Capillary: 281 mg/dL — ABNORMAL HIGH (ref 70–99)
Glucose-Capillary: 189 mg/dL — ABNORMAL HIGH (ref 70–99)
Glucose-Capillary: 181 mg/dL — ABNORMAL HIGH (ref 70–99)

## 2024-04-20 MED ORDER — INSULIN GLARGINE 100 UNIT/ML ~~LOC~~ SOLN
25.0000 [IU] | Freq: Every day | SUBCUTANEOUS | Status: DC
Start: 2024-04-20 — End: 2024-04-21
  Administered 2024-04-20: 25 [IU] via SUBCUTANEOUS
  Filled 2024-04-20 (×2): qty 0.25

## 2024-04-20 NOTE — Plan of Care (Signed)
  Problem: Coping: Goal: Ability to adjust to condition or change in health will improve Outcome: Progressing   Problem: Fluid Volume: Goal: Ability to maintain a balanced intake and output will improve Outcome: Progressing   Problem: Clinical Measurements: Goal: Respiratory complications will improve Outcome: Progressing   Problem: Coping: Goal: Level of anxiety will decrease Outcome: Progressing   Problem: Elimination: Goal: Will not experience complications related to urinary retention Outcome: Progressing   Problem: Education: Goal: Verbalization of understanding the information provided (i.e., activity precautions, restrictions, etc) will improve Outcome: Progressing   Problem: Clinical Measurements: Goal: Postoperative complications will be avoided or minimized Outcome: Progressing

## 2024-04-20 NOTE — Evaluation (Signed)
 Physical Therapy Evaluation Patient Details Name: Samuel Ramos. MRN: [id number] DOB: [birth year] Today's Date: 04/20/2024  History of Present Illness  83 y.o. male admitted for Acute displaced right intratrochanteric femoral fracture extending to the proximal femoral shaft secondary to mechanical fall and Status post intramedullary fixation right femur 04/19/2024.  Past medical history significant for type 2 diabetes, hyperlipidemia, prior history of right tibia/fibula fracture in his 82s  Clinical Impression  Patient is s/p above surgery resulting in functional limitations due to the deficits listed below (see PT Problem List).  Patient will benefit from acute skilled PT to increase their independence and safety with mobility to facilitate discharge.  Pt pleasantly confused at time of evaluation. Pt does not recall events after fall and pain onset.  Family present and educated on rehab process and pt's PWB status.  Pt from [facility] and plans to likely return to SNF section at d/c (family would prefer rehab section of their current facility).  Pt and family uncertain of pain medications provided however RN brought PO meds to room during session.  Family had questions on tolerance and level of pain medication which were deferred to RN and MD.  Pt not able to tolerate R LE assisted movement or bed mobility today due to pain (and fear of pain).         If plan is discharge home, recommend the following: Two people to help with walking and/or transfers;Two people to help with bathing/dressing/bathroom;Assistance with cooking/housework;Help with stairs or ramp for entrance;Assist for transportation   Can travel by private vehicle        Equipment Recommendations None recommended by PT (defer to next venue)  Recommendations for Other Services       Functional Status Assessment Patient has had a recent decline in their functional status and demonstrates the ability to make significant improvements in  function in a reasonable and predictable amount of time.     Precautions / Restrictions Precautions Precautions: Fall Restrictions Weight Bearing Restrictions Per Provider Order: Yes RLE Weight Bearing Per Provider Order: Partial weight bearing RLE Partial Weight Bearing Percentage or Pounds: 50      Mobility  Bed Mobility               General bed mobility comments: pt did attempt to raise trunk from bed but became fearful of pain, allowed HOB raised approx 55*, declined further bed mobility, RN brought PO pain meds    Transfers                        Ambulation/Gait                  Stairs            Wheelchair Mobility     Tilt Bed    Modified Rankin (Stroke Patients Only)       Balance                                             Pertinent Vitals/Pain Pain Assessment Pain Assessment: Faces Faces Pain Scale: Hurts even more Pain Location: little pain at rest, grimacing and guarding - very fearful of pain, RN brought pain meds Pain Descriptors / Indicators: Sore, Guarding, Grimacing, Aching Pain Intervention(s): Monitored during session, Repositioned, RN gave pain meds during session    Home Living Family/patient expects  to be discharged to:: Private residence Living Arrangements: Spouse/significant other   Type of Home: Independent living facility           Home Equipment: None Additional Comments: from [facility]    Prior Function Prior Level of Function : Independent/Modified Independent             Mobility Comments: walks the dog, uses the pool, very active prior to admission per spouse       Extremity/Trunk Assessment        Lower Extremity Assessment Lower Extremity Assessment: RLE deficits/detail RLE Deficits / Details: able to perform ankle pumps but not full AROM, pt fearful of pain and hesitant to touch or assist, fair quad contraction, pt declined assist for LE movement due to  pain; family reports pt has increased right toe out and/or external rotation from previous tib/fib repair (chronic) RLE: Unable to fully assess due to pain       Communication   Communication Communication: No apparent difficulties    Cognition Arousal: Alert Behavior During Therapy: WFL for tasks assessed/performed   PT - Cognitive impairments: Memory                       PT - Cognition Comments: family reports pt likely has mild cognitive impairment at baseline however not currently at baseline, pt confused in regards to events and admission, does not remember anything after his fall and pain onset Following commands: Intact       Cueing Cueing Techniques: Verbal cues, Tactile cues     General Comments      Exercises     Assessment/Plan    PT Assessment Patient needs continued PT services  PT Problem List Decreased strength;Decreased activity tolerance;Decreased balance;Decreased mobility;Decreased knowledge of use of DME;Pain       PT Treatment Interventions Gait training;DME instruction;Balance training;Therapeutic exercise;Therapeutic activities;Wheelchair mobility training;Functional mobility training;Patient/family education    PT Goals (Current goals can be found in the Care Plan section)  Acute Rehab PT Goals PT Goal Formulation: With patient/family Time For Goal Achievement: 05/04/24 Potential to Achieve Goals: Good    Frequency Min 3X/week     Co-evaluation               AM-PAC PT 6 Clicks Mobility  Outcome Measure Help needed turning from your back to your side while in a flat bed without using bedrails?: Total Help needed moving from lying on your back to sitting on the side of a flat bed without using bedrails?: Total Help needed moving to and from a bed to a chair (including a wheelchair)?: Total Help needed standing up from a chair using your arms (e.g., wheelchair or bedside chair)?: Total Help needed to walk in hospital  room?: Total Help needed climbing 3-5 steps with a railing? : Total 6 Click Score: 6    End of Session   Activity Tolerance: Patient limited by pain Patient left: in bed;with call bell/phone within reach;with bed alarm set;with family/visitor present Nurse Communication: Mobility status;Patient requests pain meds PT Visit Diagnosis: Muscle weakness (generalized) (M62.81);Difficulty in walking, not elsewhere classified (R26.2);Pain Pain - Right/Left: Right Pain - part of body: Leg    Time: 8872-8846 PT Time Calculation (min) (ACUTE ONLY): 26 min   Charges:   PT Evaluation $PT Eval Low Complexity: 1 Low   PT General Charges $$ ACUTE PT VISIT: 1 Visit       Tari PT, DPT Physical Therapist Acute Rehabilitation Services Office: [phone]  Kati L Payson 04/20/2024, 2:33 PM

## 2024-04-20 NOTE — Progress Notes (Signed)
    Subjective:  Patient reports pain as mild to moderate.  Denies N/V/CP/SOB. No c/o.  Objective:   VITALS:   Vitals:   04/20/24 0317 04/20/24 0320 04/20/24 0335 04/20/24 0546  BP: (!) 118/55 (!) 118/55 (!) 112/51 (!) 113/50  Pulse: (!) 102 (!) 102 92 85  Resp: 16 16 16 16   Temp: 98.5 F (36.9 C) 98.5 F (36.9 C) 98.5 F (36.9 C) 98.4 F (36.9 C)  TempSrc: Oral Oral Oral Oral  SpO2: 99%  97% 98%  Weight:      Height:        NAD ABD soft Sensation intact distally Intact pulses distally Dorsiflexion/Plantar flexion intact Incision: dressing C/D/I Compartment soft   Lab Results  Component Value Date   WBC 8.0 04/20/2024   HGB 9.0 (L) 04/20/2024   HCT 30.0 (L) 04/20/2024   MCV 86.2 04/20/2024   PLT 98 (L) 04/20/2024   BMET    Component Value Date/Time   NA 140 04/20/2024 0659   K 4.1 04/20/2024 0659   CL 106 04/20/2024 0659   CO2 27 04/20/2024 0659   GLUCOSE 157 (H) 04/20/2024 0659   BUN 11 04/20/2024 0659   CREATININE 0.79 04/20/2024 0659   CALCIUM 8.0 (L) 04/20/2024 0659   GFRNONAA >60 04/20/2024 0659     Assessment/Plan: 1 Day Post-Op   Principal Problem:   Trochanteric fracture (HCC)   50% WB RLE with walker DVT ppx: Lovenox  in house --> d/c on ASA, SCDs, TEDS PO pain control PT/OT Dispo: [facility] placement, Rx on chart   Samuel Ramos 04/20/2024, 11:20 AM   Samuel Shoals, MD [phone] [facility] is now [facility]  [place] [address], [address], [place], [place] [address] Phone: [phone] [email or web address] Facebook  [employer]

## 2024-04-20 NOTE — Progress Notes (Signed)
 PROGRESS NOTE    Samuel Ramos.  FMW:[id number] DOB: 18-May-1941 DOA: 04/18/2024 PCP: Charlott Dorn LABOR, MD   Brief Narrative:  HPI: Samuel Ramos. is a 83 y.o. male with medical history significant for type 2 diabetes, hyperlipidemia, prior history of tibia/fibula fracture in his 54s, who presents to the ER after a mechanical fall.  The patient was walking his dog when he tripped and fell.  He was in his normal state of health prior to this.  He landed on his right side.  He was on the ground for hours.  He could not get back up without assistance.   In the ER, with significant pain in the right hip which is shortened and externally rotated.  Right hip x-ray showing acute displaced right intratrochanteric femoral fracture extending to the proximal femoral shaft.   EDP discussed the case with orthopedic surgery, Dr. Fidel.  N.p.o. after midnight, until seen by orthopedic surgery tomorrow.  Admitted for surgical repair of acute displaced right intertrochanteric femoral fracture extending to the proximal femoral shaft.     ED Course: Temperature 98.  BP 117/66, pulse 90, respiratory rate 11, O2 saturation 97% on room air.  Assessment & Plan:   Principal Problem:   Trochanteric fracture (HCC)  Acute displaced right intratrochanteric femoral fracture extending to the proximal femoral shaft secondary to mechanical fall. Orthopedic surgery on board.  Status post intramedullary fixation right femur 04/19/2024.  To be seen by PT OT.  Management per orthopedics.   Uncontrolled type 2 diabetes with hyperglycemia: hemoglobin A1c 9.0 here.  Appears to be on multiple oral medications as well as Lantus  at home.  Currently on home dose of Lantus  21 units and SSI.  Blood sugar fairly controlled.  Will increase Lantus  to 25 units.   Hyperlipidemia  Zetia    Chronic intermittent thrombocytopenia Platelet count 127 Monitor for now.  CBC pending today.  DVT prophylaxis: SCDs Start: 04/19/24  2120 Place TED hose Start: 04/19/24 2120 SCDs Start: 04/18/24 2203   Code Status: Full Code  Family Communication:- none present at bedside.  Plan of care discussed with patient in length and he/she verbalized understanding and agreed with it.  Status is: Inpatient Remains inpatient appropriate because: Scheduled for hip surgery today.   Estimated body mass index is 23.5 kg/m as calculated from the following:   Height as of this encounter: 6' 2 (1.88 m).   Weight as of this encounter: 83 kg.    Nutritional Assessment: Body mass index is 23.5 kg/m.[name] Seen by dietician.  I agree with the assessment and plan as outlined below: Nutrition Status:        . Skin Assessment: I have examined the patient's skin and I agree with the wound assessment as performed by the wound care RN as outlined below:    Consultants:  Orthopedics  Procedures:  As above  Antimicrobials:  Anti-infectives (From admission, onward)    Start     Dose/Rate Route Frequency Ordered Stop   04/20/24 0600  ceFAZolin  (ANCEF ) IVPB 2g/100 mL premix        2 g 200 mL/hr over 30 Minutes Intravenous On call to O.R. 04/19/24 1505 04/19/24 1844   04/20/24 0200  ceFAZolin  (ANCEF ) IVPB 2g/100 mL premix        2 g 200 mL/hr over 30 Minutes Intravenous Every 6 hours 04/19/24 2119 04/20/24 1359         Subjective: Patient seen and examined, complains of right wrist pain.  No  other complaint.  Objective: Vitals:   04/20/24 0317 04/20/24 0320 04/20/24 0335 04/20/24 0546  BP: (!) 118/55 (!) 118/55 (!) 112/51 (!) 113/50  Pulse: (!) 102 (!) 102 92 85  Resp: 16 16 16 16   Temp: 98.5 F (36.9 C) 98.5 F (36.9 C) 98.5 F (36.9 C) 98.4 F (36.9 C)  TempSrc: Oral Oral Oral Oral  SpO2: 99%  97% 98%  Weight:      Height:        Intake/Output Summary (Last 24 hours) at 04/20/2024 0814 Last data filed at 04/20/2024 0545 Gross per 24 hour  Intake 2613.07 ml  Output 1025 ml  Net 1588.07 ml   Filed Weights    04/19/24 0030 04/19/24 1501  Weight: 82.8 kg 83 kg    Examination:  General exam: Appears calm and comfortable  Respiratory system: Clear to auscultation. Respiratory effort normal. Cardiovascular system: S1 & S2 heard, RRR. No JVD, murmurs, rubs, gallops or clicks. No pedal edema. Gastrointestinal system: Abdomen is nondistended, soft and nontender. No organomegaly or masses felt. Normal bowel sounds heard. Central nervous system: Alert and oriented. No focal neurological deficits. Skin: No rashes, lesions or ulcers Psychiatry: Judgement and insight appear normal. Mood & affect appropriate.    Data Reviewed: I have personally reviewed following labs and imaging studies  CBC: Recent Labs  Lab 04/18/24 2037 04/19/24 1100 04/20/24 0659  WBC 9.3 7.5 8.0  NEUTROABS 8.1* 5.7 6.3  HGB 10.2* 8.4* 9.0*  HCT 34.4* 28.4* 30.0*  MCV 84.1 85.3 86.2  PLT 127* 116* 98*   Basic Metabolic Panel: Recent Labs  Lab 04/18/24 2037 04/20/24 0659  NA 140 140  K 4.0 4.1  CL 105 106  CO2 24 27  GLUCOSE 224* 157*  BUN 11 11  CREATININE 0.90 0.79  CALCIUM 8.5* 8.0*   GFR: Estimated Creatinine Clearance: 84.2 mL/min (by C-G formula based on SCr of 0.79 mg/dL). Liver Function Tests: No results for input(s): AST, ALT, ALKPHOS, BILITOT, PROT, ALBUMIN in the last 168 hours. No results for input(s): LIPASE, AMYLASE in the last 168 hours. No results for input(s): AMMONIA in the last 168 hours. Coagulation Profile: Recent Labs  Lab 04/18/24 2037  INR 0.9   Cardiac Enzymes: No results for input(s): CKTOTAL, CKMB, CKMBINDEX, TROPONINI in the last 168 hours. BNP (last 3 results) No results for input(s): PROBNP in the last 8760 hours. HbA1C: Recent Labs    04/19/24 0050  HGBA1C 9.0*   CBG: Recent Labs  Lab 04/19/24 0809 04/19/24 1214 04/19/24 2005 04/19/24 2135 04/20/24 0733  GLUCAP 216* 133* 113* 154* 168*   Lipid Profile: No results for input(s):  CHOL, HDL, LDLCALC, TRIG, CHOLHDL, LDLDIRECT in the last 72 hours. Thyroid Function Tests: No results for input(s): TSH, T4TOTAL, FREET4, T3FREE, THYROIDAB in the last 72 hours. Anemia Panel: No results for input(s): VITAMINB12, FOLATE, FERRITIN, TIBC, IRON, RETICCTPCT in the last 72 hours. Sepsis Labs: No results for input(s): PROCALCITON, LATICACIDVEN in the last 168 hours.  No results found for this or any previous visit (from the past 240 hours).   Radiology Studies: DG FEMUR, MIN 2 VIEWS RIGHT Result Date: 04/19/2024 CLINICAL DATA:  Closed comminuted inter trochanteric fracture of the proximal right femur. Postop right IM nail. EXAM: RIGHT FEMUR 2 VIEWS COMPARISON:  04/18/2024 FINDINGS: Inter trochanteric and subtrochanteric fractures again demonstrated in the proximal right femur. Interval reduction of the fractures with near anatomic alignment demonstrated. There is residual displacement of lesser trochanteric fragments. Interval placement of internal  fixation using an intramedullary rod with compression bold and distal locking screw. Skin clips and soft tissue gas are consistent with recent surgery. Degenerative changes in the right hip. IMPRESSION: Postoperative changes with internal fixation of comminuted fractures of the inter trochanteric right proximal femur. Near anatomic alignment is demonstrated. Electronically Signed   By: Elsie Gravely M.D.   On: 04/19/2024 20:37   DG HIP UNILAT WITH PELVIS 2-3 VIEWS RIGHT Result Date: 04/19/2024 CLINICAL DATA:  Elective surgery.  Intraoperative right IM nail. EXAM: DG HIP (WITH OR WITHOUT PELVIS) 2-3V RIGHT COMPARISON:  04/18/2024 FINDINGS: Intraoperative fluoroscopy is obtained for surgical control purposes. Fluoroscopy time is recorded at 1 minute 22 seconds. Dose 7.9955 mGy. 8 spot fluoroscopic images are obtained. Spot fluoroscopic images obtained initially demonstrate a comminuted inter trochanteric and  subtrochanteric fracture of the proximal right femur. Degenerative changes in the right hip. Subsequent images demonstrate reduction and internal fixation of the fractures using a compression bold with long-stem femoral intramedullary rod. A distal locking screw is placed. Degenerative changes are noted in the right knee. IMPRESSION: Intraoperative fluoroscopy is utilized for surgical control purposes demonstrating internal fixation of inter trochanteric and subtrochanteric fractures of the right proximal femur. Electronically Signed   By: Elsie Gravely M.D.   On: 04/19/2024 19:27   DG C-Arm 1-60 Min-No Report Result Date: 04/19/2024 Fluoroscopy was utilized by the requesting physician.  No radiographic interpretation.   DG C-Arm 1-60 Min-No Report Result Date: 04/19/2024 Fluoroscopy was utilized by the requesting physician.  No radiographic interpretation.   DG Knee Right Port Result Date: 04/19/2024 CLINICAL DATA:  Right hip fracture. EXAM: PORTABLE RIGHT KNEE - 1-2 VIEW COMPARISON:  04/18/2024. FINDINGS: No acute osseous or joint abnormality. Degenerative changes in the knee. IMPRESSION: No acute findings. Electronically Signed   By: Newell Eke M.D.   On: 04/19/2024 09:14   CT Hip Right Wo Contrast Result Date: 04/18/2024 EXAM: CT OF THE RIGHT HIP WITHOUT IV CONTRAST 04/18/2024 10: 58:43 PM TECHNIQUE: CT of the right hip was performed without the administration of intravenous contrast. Multiplanar reformatted images are provided for review. Automated exposure control, iterative reconstruction, and/or weight based adjustment of the mA/kV was utilized to reduce the radiation dose to as low as reasonably achievable. COMPARISON: None available. CLINICAL HISTORY: Right hip fracture, operative planning. Per chart: Patient brought in from home, had a trip and fall while walking his dog, falling onto his right side. Presents with right hip pain and radiating pain down the leg. FINDINGS: BONES: There  is an acute, comminuted, impacted fracture of the intertrochanteric/subtrochanteric region of the right hip with avulsion of the lesser trochanter. The dominant fracture planes extend obliquely within the intertrochanteric region, extending into the posterior cortex of the proximal femoral diaphysis, best seen on sagittal image 45, series 9. There is impaction at the intertrochanteric fracture plane with moderate varus angulation. SOFT TISSUE: Mild vascular calcifications are noted. JOINT: The femoral head is still seated within the right acetabulum with moderate superimposed degenerative arthritis of the right hip. INTRAPELVIC CONTENTS: Incidental note of distal colonic diverticulosis. IMPRESSION: 1. Acute, comminuted, impacted fracture of the intertrochanteric/subtrochanteric region of the right hip with avulsion of the lesser trochanter and moderate varus angulation. 2. Moderate degenerative arthritis of the right hip. Electronically signed by: Dorethia Molt MD 04/18/2024 11:06 PM EDT RP Workstation: [id number]   DG Lumbar Spine 2-3 Views Result Date: 04/18/2024 CLINICAL DATA:  [id number] Fall [id number] EXAM: LUMBAR SPINE - 2-3 VIEW COMPARISON:  X-ray lumbar spine  05/04/2018 FINDINGS: There is no evidence of lumbar spine fracture. Multilevel moderate degenerative change of the spine. Alignment is normal. Intervertebral disc spaces are maintained. Atherosclerotic plaque. IMPRESSION: No acute displaced fracture or traumatic listhesis of the lumbar spine. Electronically Signed   By: Morgane  Naveau M.D.   On: 04/18/2024 20:41   DG Chest Port 1 View Result Date: 04/18/2024 CLINICAL DATA:  fall, pain Pt fell while walking his dog this evening, pt fell on right hip. Pt c/o right hip pain radiating down leg. EXAM: PORTABLE CHEST 1 VIEW COMPARISON:  Chest x-ray 08/19/2022. FINDINGS: The heart and mediastinal contours are unchanged. Atherosclerotic plaque. No focal consolidation. No pulmonary edema. No pleural effusion. No  pneumothorax. No acute osseous abnormality. IMPRESSION: 1. No active disease. 2.  Aortic Atherosclerosis (ICD10-I70.0). Electronically Signed   By: Morgane  Naveau M.D.   On: 04/18/2024 20:38   DG Hip Unilat With Pelvis 2-3 Views Right Result Date: 04/18/2024 CLINICAL DATA:  fall, pain EXAM: DG HIP (WITH OR WITHOUT PELVIS) 2-3V RIGHT COMPARISON:  CT abdomen pelvis 01/06/2013 FINDINGS: Acute displaced right intratrochanteric femoral fracture extending to the proximal femoral shaft. No right hip dislocation. No acute displaced fracture or dislocation of the left hip on frontal view. Question avascular necrosis of the left femoral head. There is no evidence of severe arthropathy or other focal bone abnormality. IMPRESSION: Acute displaced right intratrochanteric femoral fracture extending to the proximal femoral shaft. Electronically Signed   By: Morgane  Naveau M.D.   On: 04/18/2024 20:36    Scheduled Meds:  aspirin  EC  325 mg Oral Daily   docusate sodium   100 mg Oral BID   ezetimibe   10 mg Oral Daily   insulin  aspart  0-5 Units Subcutaneous QHS   insulin  aspart  0-9 Units Subcutaneous TID WC   insulin  glargine  21 Units Subcutaneous Daily   methocarbamol   500 mg Oral Once   Continuous Infusions:  sodium chloride       ceFAZolin  (ANCEF ) IV 2 g (04/20/24 0239)   lactated ringers  50 mL/hr at 04/18/24 2214   lactated ringers  50 mL/hr at 04/19/24 2021     LOS: 2 days   Fredia Skeeter, MD [facility]  04/20/2024, 8:14 AM   *Please note that this is a verbal dictation therefore any spelling or grammatical errors are due to the Dragon Medical One system interpretation.  Please page via Amion and do not message via secure chat for urgent patient care matters. Secure chat can be used for non urgent patient care matters.  How to contact the [facility] Attending or Consulting provider 7A - 7P or covering provider during after hours 7P -7A, for this patient?  Check the care team in [facility] and look for  a) attending/consulting [facility] provider listed and b) the [facility] team listed. Page or secure chat 7A-7P. Log into [email or web address] and use [place]'s universal password to access. If you do not have the password, please contact the hospital operator. Locate the [facility] provider you are looking for under [facility] and page to a number that you can be directly reached. If you still have difficulty reaching the provider, please page the [facility] (Director on Call) for the Hospitalists listed on amion for assistance.

## 2024-04-21 ENCOUNTER — Encounter (HOSPITAL_COMMUNITY): Payer: Self-pay | Admitting: Orthopedic Surgery

## 2024-04-21 ENCOUNTER — Other Ambulatory Visit (HOSPITAL_BASED_OUTPATIENT_CLINIC_OR_DEPARTMENT_OTHER): Payer: Self-pay

## 2024-04-21 ENCOUNTER — Other Ambulatory Visit: Payer: Self-pay

## 2024-04-21 DIAGNOSIS — S72101A Unspecified trochanteric fracture of right femur, initial encounter for closed fracture: Secondary | ICD-10-CM | POA: Diagnosis not present

## 2024-04-21 LAB — CBC WITH DIFFERENTIAL/PLATELET
Abs Immature Granulocytes: 0.04 K/uL (ref 0.00–0.07)
Basophils Absolute: 0 K/uL (ref 0.0–0.1)
Basophils Relative: 0 %
Eosinophils Absolute: 0 K/uL (ref 0.0–0.5)
Eosinophils Relative: 0 %
HCT: 30.6 % — ABNORMAL LOW (ref 39.0–52.0)
Hemoglobin: 9.6 g/dL — ABNORMAL LOW (ref 13.0–17.0)
Immature Granulocytes: 1 %
Lymphocytes Relative: 6 %
Lymphs Abs: 0.5 K/uL — ABNORMAL LOW (ref 0.7–4.0)
MCH: 26.4 pg (ref 26.0–34.0)
MCHC: 31.4 g/dL (ref 30.0–36.0)
MCV: 84.1 fL (ref 80.0–100.0)
Monocytes Absolute: 0.6 K/uL (ref 0.1–1.0)
Monocytes Relative: 7 %
Neutro Abs: 7.4 K/uL (ref 1.7–7.7)
Neutrophils Relative %: 86 %
Platelets: 114 K/uL — ABNORMAL LOW (ref 150–400)
RBC: 3.64 MIL/uL — ABNORMAL LOW (ref 4.22–5.81)
RDW: 14 % (ref 11.5–15.5)
WBC: 8.6 K/uL (ref 4.0–10.5)
nRBC: 0 % (ref 0.0–0.2)

## 2024-04-21 LAB — TYPE AND SCREEN
ABO/RH(D): A POS
Antibody Screen: NEGATIVE
Unit division: 0
Unit division: 0

## 2024-04-21 LAB — BPAM RBC
Blood Product Expiration Date: 202510252359
Blood Product Expiration Date: 202510292359
ISSUE DATE / TIME: 202510082328
ISSUE DATE / TIME: 202510090315
Unit Type and Rh: 6200
Unit Type and Rh: 6200

## 2024-04-21 LAB — GLUCOSE, CAPILLARY
Glucose-Capillary: 192 mg/dL — ABNORMAL HIGH (ref 70–99)
Glucose-Capillary: 180 mg/dL — ABNORMAL HIGH (ref 70–99)
Glucose-Capillary: 174 mg/dL — ABNORMAL HIGH (ref 70–99)
Glucose-Capillary: 158 mg/dL — ABNORMAL HIGH (ref 70–99)

## 2024-04-21 MED ORDER — INSULIN GLARGINE 100 UNIT/ML ~~LOC~~ SOLN
30.0000 [IU] | Freq: Every day | SUBCUTANEOUS | Status: DC
  Administered 2024-04-21 – 2024-04-22 (×2): 30 [IU] via SUBCUTANEOUS
  Filled 2024-04-21 (×2): qty 0.3

## 2024-04-21 MED ORDER — QUETIAPINE FUMARATE 25 MG PO TABS
25.0000 mg | ORAL_TABLET | ORAL | Status: DC
Start: 2024-04-21 — End: 2024-04-22
  Administered 2024-04-21: 25 mg via ORAL
  Filled 2024-04-21: qty 1

## 2024-04-21 NOTE — TOC Progression Note (Signed)
 Transition of Care (TOC) - Progression Note    Patient Details  Name: Samuel Ramos. MRN: [id number] Date of Birth: 1941-03-15  Transition of Care [facility]) CM/SW Contact  Doneta Glenys DASEN, RN Phone Number: 04/21/2024, 1:04 PM  Clinical Narrative:    CM spoke with Garry Doffing Sullivan-[phone]. Doffing said the patient has a bed(Rm 151) ready and waiting. At discharge, call report to [facility] [phone], and to send FL2 and discharge summary with patient. Doffing saidthey don't need insurance auth or [employer].  Expected Discharge Plan: Skilled Nursing Facility Barriers to Discharge: Continued Medical Work up               Expected Discharge Plan and Services In-house Referral: NA Discharge Planning Services: CM Consult   Living arrangements for the past 2 months: Independent Living Facility                 DME Arranged: N/A DME Agency: NA       HH Arranged: NA HH Agency: NA         Social Drivers of Health (SDOH) Interventions SDOH Screenings   Food Insecurity: No Food Insecurity (04/19/2024)  Housing: Low Risk  (04/19/2024)  Transportation Needs: No Transportation Needs (04/19/2024)  Utilities: Not At Risk (04/19/2024)  Social Connections: Socially Integrated (04/19/2024)  Tobacco Use: Low Risk  (04/19/2024)    Readmission Risk Interventions     No data to display

## 2024-04-21 NOTE — Progress Notes (Signed)
    Subjective:  Patient reports pain as mild to moderate.  Denies N/V/CP/SOB. No c/o.  Objective:   VITALS:   Vitals:   04/20/24 0546 04/20/24 1414 04/20/24 2020 04/21/24 0440  BP: (!) 113/50 (!) 115/55 121/63 135/62  Pulse: 85 81 96 83  Resp: 16 16 18 18   Temp: 98.4 F (36.9 C) 98.9 F (37.2 C) 98.8 F (37.1 C) 98.2 F (36.8 C)  TempSrc: Oral     SpO2: 98% 96% 93% 97%  Weight:      Height:        NAD ABD soft Sensation intact distally Intact pulses distally Dorsiflexion/Plantar flexion intact Incision: dressing C/D/I Compartment soft   Lab Results  Component Value Date   WBC 8.6 04/21/2024   HGB 9.6 (L) 04/21/2024   HCT 30.6 (L) 04/21/2024   MCV 84.1 04/21/2024   PLT 114 (L) 04/21/2024   BMET    Component Value Date/Time   NA 140 04/20/2024 0659   K 4.1 04/20/2024 0659   CL 106 04/20/2024 0659   CO2 27 04/20/2024 0659   GLUCOSE 157 (H) 04/20/2024 0659   BUN 11 04/20/2024 0659   CREATININE 0.79 04/20/2024 0659   CALCIUM 8.0 (L) 04/20/2024 0659   GFRNONAA >60 04/20/2024 0659     Assessment/Plan: 2 Days Post-Op   Principal Problem:   Trochanteric fracture (HCC)   50% WB RLE with walker DVT ppx: Lovenox  in house --> d/c on ASA, SCDs, TEDS PO pain control PT/OT Dispo: [facility] placement, Rx on chart   Samuel Ramos 04/21/2024, 10:15 AM   Samuel Shoals, MD [phone] [facility] is now [facility] [address], [address], [place], [place] [address] Phone: [phone] [email or web address] Facebook  [employer]

## 2024-04-21 NOTE — Progress Notes (Signed)
 PROGRESS NOTE    Samuel Ramos.  FMW:[id number] DOB: 17-Jun-1941 DOA: 04/18/2024 PCP: Charlott Dorn LABOR, MD   Brief Narrative:  HPI: Samuel Ramos. is a 83 y.o. male with medical history significant for type 2 diabetes, hyperlipidemia, prior history of tibia/fibula fracture in his 65s, who presents to the ER after a mechanical fall.  The patient was walking his dog when he tripped and fell.  He was in his normal state of health prior to this.  He landed on his right side.  He was on the ground for hours.  He could not get back up without assistance.   In the ER, with significant pain in the right hip which is shortened and externally rotated.  Right hip x-ray showing acute displaced right intratrochanteric femoral fracture extending to the proximal femoral shaft.   EDP discussed the case with orthopedic surgery, Dr. Fidel.  N.p.o. after midnight, until seen by orthopedic surgery tomorrow.  Admitted for surgical repair of acute displaced right intertrochanteric femoral fracture extending to the proximal femoral shaft.     ED Course: Temperature 98.  BP 117/66, pulse 90, respiratory rate 11, O2 saturation 97% on room air.  Assessment & Plan:   Principal Problem:   Trochanteric fracture (HCC)  Acute displaced right intratrochanteric femoral fracture extending to the proximal femoral shaft secondary to mechanical fall. Orthopedic surgery on board.  Status post intramedullary fixation right femur 04/19/2024.  Seen by PT OT, SNF recommended.  TOC on board.  Patient on aspirin  for DVT prophylaxis per orthopedics management per orthopedics.   Uncontrolled type 2 diabetes with hyperglycemia: hemoglobin A1c 9.0 here.  Appears to be on multiple oral medications as well as Lantus  at home.  Currently on home dose of Lantus  25, still hyperglycemic, increase this to 30 units.  Continue SSI.   Hyperlipidemia  Zetia    Chronic intermittent thrombocytopenia Platelet count 127 which dropped  to 98 yesterday.  No signs of bleeding.  Rechecking CBC today. Monitor for now.   Delirium: Patient is very confused this morning.  He thinks that he is at a zoo and currently he is in the dining room and he came in with 10 different friends.  Per nursing, some reports of him not having good sleep last night.  Will start him on Seroquel tonight.  Delirium management as below. 1. Avoid benzodiazepines, antihistamines, anticholinergics, and minimize opiate use as these may worsen delirium. 2: Assess, prevent and manage pain as lack of treatment can result in delirium.  3: Provide appropriate lighting and clear signage; a clock and calendar should be easily visible to the patient. 4: Monitor environmental factors. Reduce light and noise at night (close shades, turn off lights, turn off TV, ect). Correct any alterations in sleep cycle. 5: Reorient the patient to person, place, time and situation on each encounter.  6: Correct sensory deficits if possible (replace eye glasses, hearing aids, ect). 7: Avoid restraints if able. Severely delirious patients benefit from constant observation by a sitter.  DVT prophylaxis: SCDs Start: 04/19/24 2120 Place TED hose Start: 04/19/24 2120 SCDs Start: 04/18/24 2203   Code Status: Full Code  Family Communication:- none present at bedside.    Status is: Inpatient Remains inpatient appropriate because: Delirious   Estimated body mass index is 23.5 kg/m as calculated from the following:   Height as of this encounter: 6' 2 (1.88 m).   Weight as of this encounter: 83 kg.    Nutritional Assessment: Body mass index  is 23.5 kg/m.[name] Seen by dietician.  I agree with the assessment and plan as outlined below: Nutrition Status:        . Skin Assessment: I have examined the patient's skin and I agree with the wound assessment as performed by the wound care RN as outlined below:    Consultants:  Orthopedics  Procedures:  As above  Antimicrobials:   Anti-infectives (From admission, onward)    Start     Dose/Rate Route Frequency Ordered Stop   04/20/24 0600  ceFAZolin  (ANCEF ) IVPB 2g/100 mL premix        2 g 200 mL/hr over 30 Minutes Intravenous On call to O.R. 04/19/24 1505 04/19/24 1844   04/20/24 0200  ceFAZolin  (ANCEF ) IVPB 2g/100 mL premix        2 g 200 mL/hr over 30 Minutes Intravenous Every 6 hours 04/19/24 2119 04/20/24 0859         Subjective: Seen and examined, patient was trying to get out of the bed when I saw him.  Nurse was not at the bedside.  He said  I have been here for very very long time, I need to go home he was confused and thought that he was at the zoo.  Objective: Vitals:   04/20/24 0546 04/20/24 1414 04/20/24 2020 04/21/24 0440  BP: (!) 113/50 (!) 115/55 121/63 135/62  Pulse: 85 81 96 83  Resp: 16 16 18 18   Temp: 98.4 F (36.9 C) 98.9 F (37.2 C) 98.8 F (37.1 C) 98.2 F (36.8 C)  TempSrc: Oral     SpO2: 98% 96% 93% 97%  Weight:      Height:        Intake/Output Summary (Last 24 hours) at 04/21/2024 0843 Last data filed at 04/21/2024 0658 Gross per 24 hour  Intake 120 ml  Output 350 ml  Net -230 ml   Filed Weights   04/19/24 0030 04/19/24 1501  Weight: 82.8 kg 83 kg    Examination:  General exam: Appears calm and comfortable  Respiratory system: Clear to auscultation. Respiratory effort normal. Cardiovascular system: S1 & S2 heard, RRR. No JVD, murmurs, rubs, gallops or clicks. No pedal edema. Gastrointestinal system: Abdomen is nondistended, soft and nontender. No organomegaly or masses felt. Normal bowel sounds heard. Central nervous system: Alert but oriented to person only. Skin: No rashes, lesions or ulcers.     Data Reviewed: I have personally reviewed following labs and imaging studies  CBC: Recent Labs  Lab 04/18/24 2037 04/19/24 1100 04/20/24 0659  WBC 9.3 7.5 8.0  NEUTROABS 8.1* 5.7 6.3  HGB 10.2* 8.4* 9.0*  HCT 34.4* 28.4* 30.0*  MCV 84.1 85.3 86.2   PLT 127* 116* 98*   Basic Metabolic Panel: Recent Labs  Lab 04/18/24 2037 04/20/24 0659  NA 140 140  K 4.0 4.1  CL 105 106  CO2 24 27  GLUCOSE 224* 157*  BUN 11 11  CREATININE 0.90 0.79  CALCIUM 8.5* 8.0*   GFR: Estimated Creatinine Clearance: 84.2 mL/min (by C-G formula based on SCr of 0.79 mg/dL). Liver Function Tests: No results for input(s): AST, ALT, ALKPHOS, BILITOT, PROT, ALBUMIN in the last 168 hours. No results for input(s): LIPASE, AMYLASE in the last 168 hours. No results for input(s): AMMONIA in the last 168 hours. Coagulation Profile: Recent Labs  Lab 04/18/24 2037  INR 0.9   Cardiac Enzymes: No results for input(s): CKTOTAL, CKMB, CKMBINDEX, TROPONINI in the last 168 hours. BNP (last 3 results) No results for input(s):  PROBNP in the last 8760 hours. HbA1C: Recent Labs    04/19/24 0050  HGBA1C 9.0*   CBG: Recent Labs  Lab 04/20/24 0733 04/20/24 1213 04/20/24 1703 04/20/24 2147 04/21/24 0734  GLUCAP 168* 281* 189* 181* 192*   Lipid Profile: No results for input(s): CHOL, HDL, LDLCALC, TRIG, CHOLHDL, LDLDIRECT in the last 72 hours. Thyroid Function Tests: No results for input(s): TSH, T4TOTAL, FREET4, T3FREE, THYROIDAB in the last 72 hours. Anemia Panel: No results for input(s): VITAMINB12, FOLATE, FERRITIN, TIBC, IRON, RETICCTPCT in the last 72 hours. Sepsis Labs: No results for input(s): PROCALCITON, LATICACIDVEN in the last 168 hours.  No results found for this or any previous visit (from the past 240 hours).   Radiology Studies: DG FEMUR, MIN 2 VIEWS RIGHT Result Date: 04/19/2024 CLINICAL DATA:  Closed comminuted inter trochanteric fracture of the proximal right femur. Postop right IM nail. EXAM: RIGHT FEMUR 2 VIEWS COMPARISON:  04/18/2024 FINDINGS: Inter trochanteric and subtrochanteric fractures again demonstrated in the proximal right femur. Interval reduction of the  fractures with near anatomic alignment demonstrated. There is residual displacement of lesser trochanteric fragments. Interval placement of internal fixation using an intramedullary rod with compression bold and distal locking screw. Skin clips and soft tissue gas are consistent with recent surgery. Degenerative changes in the right hip. IMPRESSION: Postoperative changes with internal fixation of comminuted fractures of the inter trochanteric right proximal femur. Near anatomic alignment is demonstrated. Electronically Signed   By: Elsie Gravely M.D.   On: 04/19/2024 20:37   DG HIP UNILAT WITH PELVIS 2-3 VIEWS RIGHT Result Date: 04/19/2024 CLINICAL DATA:  Elective surgery.  Intraoperative right IM nail. EXAM: DG HIP (WITH OR WITHOUT PELVIS) 2-3V RIGHT COMPARISON:  04/18/2024 FINDINGS: Intraoperative fluoroscopy is obtained for surgical control purposes. Fluoroscopy time is recorded at 1 minute 22 seconds. Dose 7.9955 mGy. 8 spot fluoroscopic images are obtained. Spot fluoroscopic images obtained initially demonstrate a comminuted inter trochanteric and subtrochanteric fracture of the proximal right femur. Degenerative changes in the right hip. Subsequent images demonstrate reduction and internal fixation of the fractures using a compression bold with long-stem femoral intramedullary rod. A distal locking screw is placed. Degenerative changes are noted in the right knee. IMPRESSION: Intraoperative fluoroscopy is utilized for surgical control purposes demonstrating internal fixation of inter trochanteric and subtrochanteric fractures of the right proximal femur. Electronically Signed   By: Elsie Gravely M.D.   On: 04/19/2024 19:27   DG C-Arm 1-60 Min-No Report Result Date: 04/19/2024 Fluoroscopy was utilized by the requesting physician.  No radiographic interpretation.   DG C-Arm 1-60 Min-No Report Result Date: 04/19/2024 Fluoroscopy was utilized by the requesting physician.  No radiographic  interpretation.   DG Knee Right Port Result Date: 04/19/2024 CLINICAL DATA:  Right hip fracture. EXAM: PORTABLE RIGHT KNEE - 1-2 VIEW COMPARISON:  04/18/2024. FINDINGS: No acute osseous or joint abnormality. Degenerative changes in the knee. IMPRESSION: No acute findings. Electronically Signed   By: Newell Eke M.D.   On: 04/19/2024 09:14    Scheduled Meds:  aspirin  EC  325 mg Oral Daily   docusate sodium   100 mg Oral BID   ezetimibe   10 mg Oral Daily   insulin  aspart  0-5 Units Subcutaneous QHS   insulin  aspart  0-9 Units Subcutaneous TID WC   insulin  glargine  25 Units Subcutaneous Daily   methocarbamol   500 mg Oral Once   Continuous Infusions:  sodium chloride      lactated ringers  50 mL/hr at 04/19/24 2021  LOS: 3 days   Fredia Skeeter, MD [facility]  04/21/2024, 8:43 AM   *Please note that this is a verbal dictation therefore any spelling or grammatical errors are due to the Dragon Medical One system interpretation.  Please page via Amion and do not message via secure chat for urgent patient care matters. Secure chat can be used for non urgent patient care matters.  How to contact the [facility] Attending or Consulting provider 7A - 7P or covering provider during after hours 7P -7A, for this patient?  Check the care team in [facility] and look for a) attending/consulting TR[facility] provider listed and b) the [facility] team listed. Page or secure chat 7A-7P. Log into [email or web address] and use [facility] universal password to access. If you do not have the password, please contact the hospital operator. Locate the [facility] provider you are looking for under [facility] and page to a number that you can be directly reached. If you still have difficulty reaching the provider, please page the [facility] (Director on Call) for the Hospitalists listed on amion for assistance.

## 2024-04-21 NOTE — Progress Notes (Signed)
 Physical Therapy Treatment Patient Details Name: Samuel Ramos. MRN: [id number] DOB: 1940/11/16 Today's Date: 04/21/2024   History of Present Illness 83 y.o. male admitted for Acute displaced right intratrochanteric femoral fracture extending to the proximal femoral shaft secondary to mechanical fall and Status post intramedullary fixation right femur 04/19/2024.  Past medical history significant for type 2 diabetes, hyperlipidemia, prior history of right tibia/fibula fracture in his 26s    PT Comments  Pt in bed with wife at bedside, agreeable to session. Pt denies pain at rest, when cued to initiate supine to sit transfer, he is unable to advance RLE. Assistance provided but not tolerated due to inc pain. Initiated PROM knee flexion with good tolerance x5 reps. Pt completes 15 reps of heel slides with gait belt for strap assist, able to complete with 25-35 degrees knee flexion and was well tolerated. Pt encouraged to complete every 2 hours while awake to progress RLE activity tolerance and progress ROM as tolerated. Based on pt PLOF, level of support, and current functional status, pt will benefit from skilled inpatient physical therapy <3 hours per day at dc for safe progression of functional mobility.     If plan is discharge home, recommend the following: Two people to help with walking and/or transfers;Two people to help with bathing/dressing/bathroom;Assistance with cooking/housework;Help with stairs or ramp for entrance;Assist for transportation   Can travel by private vehicle     No  Equipment Recommendations  None recommended by PT    Recommendations for Other Services       Precautions / Restrictions Precautions Precautions: Fall Recall of Precautions/Restrictions: Intact Restrictions Weight Bearing Restrictions Per Provider Order: Yes RLE Weight Bearing Per Provider Order: Partial weight bearing RLE Partial Weight Bearing Percentage or Pounds: 50     Mobility  Bed  Mobility Overal bed mobility: Needs Assistance Bed Mobility: Supine to Sit     Supine to sit: Total assist     General bed mobility comments: Pt unable to advance RLE from supine to sit despite assistance. He requires assist to place back on bed fully and while donning/doffing SCDs. unable to attain sitting position due to pain this session.    Transfers Overall transfer level: Needs assistance                      Ambulation/Gait                   Stairs             Wheelchair Mobility     Tilt Bed    Modified Rankin (Stroke Patients Only)       Balance                                            Communication Communication Communication: No apparent difficulties  Cognition Arousal: Alert Behavior During Therapy: WFL for tasks assessed/performed   PT - Cognitive impairments: Memory                       PT - Cognition Comments: family states that pt has been having some confusion since admission Following commands: Intact      Cueing Cueing Techniques: Verbal cues, Tactile cues  Exercises Total Joint Exercises Heel Slides: AAROM, 15 reps, Supine, Right    General Comments General comments (skin integrity, edema, etc.): unable  to assess balance in sitting due to inability to attain position      Pertinent Vitals/Pain Pain Assessment Pain Assessment: Faces Faces Pain Scale: Hurts whole lot Pain Location: no pain at rest, unable to advance limb towards EOB or add into better position in bed. Pain Descriptors / Indicators: Sore, Guarding, Grimacing, Aching Pain Intervention(s): Limited activity within patient's tolerance, Monitored during session, Repositioned    Home Living                          Prior Function            PT Goals (current goals can now be found in the care plan section) Acute Rehab PT Goals PT Goal Formulation: With patient/family Time For Goal Achievement:  05/04/24 Potential to Achieve Goals: Good Progress towards PT goals: Progressing toward goals    Frequency    Min 3X/week      PT Plan      Co-evaluation              AM-PAC PT 6 Clicks Mobility   Outcome Measure  Help needed turning from your back to your side while in a flat bed without using bedrails?: Total Help needed moving from lying on your back to sitting on the side of a flat bed without using bedrails?: Total Help needed moving to and from a bed to a chair (including a wheelchair)?: Total Help needed standing up from a chair using your arms (e.g., wheelchair or bedside chair)?: Total Help needed to walk in hospital room?: Total Help needed climbing 3-5 steps with a railing? : Total 6 Click Score: 6    End of Session   Activity Tolerance: Patient limited by pain Patient left: in bed;with call bell/phone within reach;with bed alarm set;with family/visitor present;with SCD's reapplied Nurse Communication: Mobility status PT Visit Diagnosis: Muscle weakness (generalized) (M62.81);Difficulty in walking, not elsewhere classified (R26.2);Pain Pain - Right/Left: Right Pain - part of body: Leg     Time: 8472-8442 PT Time Calculation (min) (ACUTE ONLY): 30 min  Charges:    $Therapeutic Exercise: 8-22 mins $Therapeutic Activity: 8-22 mins PT General Charges $$ ACUTE PT VISIT: 1 Visit                     Samuel Ramos, PT Acute Rehabilitation Services Office: [phone] 04/21/2024    Samuel Ramos Samuel Ramos 04/21/2024, 4:33 PM

## 2024-04-21 NOTE — Plan of Care (Signed)
  Problem: Pain Managment: Goal: General experience of comfort will improve and/or be controlled Outcome: Progressing   Problem: Safety: Goal: Ability to remain free from injury will improve Outcome: Progressing   Problem: Skin Integrity: Goal: Risk for impaired skin integrity will decrease Outcome: Progressing   Problem: Clinical Measurements: Goal: Postoperative complications will be avoided or minimized Outcome: Progressing   Problem: Pain Management: Goal: Pain level will decrease Outcome: Progressing

## 2024-04-21 NOTE — TOC Initial Note (Signed)
 Transition of Care (TOC) - Initial/Assessment Note    Patient Details  Name: Samuel Ramos. MRN: [id number] Date of Birth: [birth year]  Transition of Care [facility]) CM/SW Contact:    Yassen Kinnett, Glenys DASEN, RN Phone Number:  Clinical Narrative:                 CM introduced and explained role patient and wife. PTA lives at [place] in a house. Requested to return to [facility] for Rehab.   Expected Discharge Plan: Skilled Nursing Facility Barriers to Discharge: Continued Medical Work up   Patient Goals and CMS Choice Patient states their goals for this hospitalization and ongoing recovery are:: To [facility] CMS Medicare.gov Compare Post Acute Care list provided to::  (NA) Choice offered to / list presented to : NA [place] ownership interest in [facility].provided to:: Parent NA    Expected Discharge Plan and Services In-house Referral: NA Discharge Planning Services: CM Consult   Living arrangements for the past 2 months: Independent Living Facility                 DME Arranged: N/A DME Agency: NA       HH Arranged: NA HH Agency: NA        Prior Living Arrangements/Services Living arrangements for the past 2 months: Independent Living Facility Lives with:: Spouse Patient language and need for interpreter reviewed:: Yes Do you feel safe going back to the place where you live?: Yes      Need for Family Participation in Patient Care: Yes (Comment) Care giver support system in place?: Yes (comment) Current home services:  (NA) Criminal Activity/Legal Involvement Pertinent to Current Situation/Hospitalization: No - Comment as needed  Activities of Daily Living   ADL Screening (condition at time of admission) Independently performs ADLs?: Yes (appropriate for developmental age) Is the patient deaf or have difficulty hearing?: No Does the patient have difficulty seeing, even when wearing glasses/contacts?: No Does the patient have difficulty  concentrating, remembering, or making decisions?: No  Permission Sought/Granted Permission sought to share information with : Case Manager Permission granted to share information with : Yes, Verbal Permission Granted  Share Information with NAME: Kervin,Sandy (Spouse)  [phone]  Permission granted to share info w AGENCY: [facility]        Emotional Assessment Appearance:: Appears stated age Attitude/Demeanor/Rapport: Engaged, Gracious Affect (typically observed): Appropriate, Accepting Orientation: : Oriented to Self, Oriented to Place, Oriented to  Time, Oriented to Situation Alcohol / Substance Use: Not Applicable Psych Involvement: No (comment)  Admission diagnosis:  Trochanteric fracture (HCC) [S72.109A] Patient Active Problem List   Diagnosis Date Noted   Trochanteric fracture (HCC) 04/18/2024   Nephrolithiasis 08/12/2015   Acute pancreatitis 08/11/2015   Hypercholesterolemia 08/11/2015   Iron deficiency anemia 08/11/2015   Diabetes mellitus without complication (HCC)    Benign prostatic hyperplasia 01/21/2015   Dizziness and giddiness 10/17/2014   PCP:  Charlott Dorn LABOR, MD Pharmacy:   [facility] [phone] [facility] - [place], [place] - [address] [place] [address] Phone: [phone] Fax: [phone]  [facility] [address] [place] [address] Phone: [phone] Fax: [phone]     Social Drivers of Health (SDOH) Social History: SDOH Screenings   Food Insecurity: No Food Insecurity (04/19/2024)  Housing: Low Risk  (04/19/2024)  Transportation Needs: No Transportation Needs (04/19/2024)  Utilities: Not At Risk (04/19/2024)  Social Connections: Socially Integrated (04/19/2024)  Tobacco Use: Low Risk  (04/19/2024)  SDOH Interventions:     Readmission Risk Interventions     No data to display

## 2024-04-21 NOTE — [place] FL2 (Signed)
 [place]  MEDICAID [id number] LEVEL OF CARE FORM     IDENTIFICATION  Patient Name: Samuel Ramos. Birthdate: September 05, 1940 Sex: male Admission Date (Current Location): 04/18/2024  [facility] and [place] Number:  Producer, television/film/video and Address:  [facility],  [address], [place] [address]      Provider Number: [id number]  Attending Physician Name and Address:  Vernon Ranks, MD  Relative Name and Phone Number:  spouse, Nickalous Stingley @ [phone]    Current Level of Care: Hospital Recommended Level of Care: Skilled Nursing Facility Prior Approval Number:    Date Approved/Denied:   PASRR Number: NA (not required for [facility])  Discharge Plan: SNF    Current Diagnoses: Patient Active Problem List   Diagnosis Date Noted   Trochanteric fracture (HCC) 04/18/2024   Nephrolithiasis 08/12/2015   Acute pancreatitis 08/11/2015   Hypercholesterolemia 08/11/2015   Iron deficiency anemia 08/11/2015   Diabetes mellitus without complication (HCC)    Benign prostatic hyperplasia 01/21/2015   Dizziness and giddiness 10/17/2014    Orientation RESPIRATION BLADDER Height & Weight     Self  Normal Continent Weight: 183 lb (83 kg) Height:  6' 2 (188 cm)  BEHAVIORAL SYMPTOMS/MOOD NEUROLOGICAL BOWEL NUTRITION STATUS      Continent    AMBULATORY STATUS COMMUNICATION OF NEEDS Skin   Extensive Assist Verbally Other (Comment) (surgical incision only)                       Personal Care Assistance Level of Assistance  Bathing, Feeding, Dressing Bathing Assistance: Limited assistance Feeding assistance: Limited assistance Dressing Assistance: Limited assistance     Functional Limitations Info  Sight, Hearing, Speech Sight Info: Adequate Hearing Info: Adequate Speech Info: Adequate    SPECIAL CARE FACTORS FREQUENCY  PT (By licensed PT), OT (By licensed OT)     PT Frequency: 5x/wk OT Frequency: 5x/wk            Contractures Contractures Info: Not present     Additional Factors Info  Code Status, Allergies, Psychotropic Code Status Info: Full Allergies Info: Januvia (Sitagliptin), Gabapentin, Vicodin (Hydrocodone-acetaminophen ) Psychotropic Info: see MAR         Current Medications (04/21/2024):  This is the current hospital active medication list Current Facility-Administered Medications  Medication Dose Route Frequency Provider Last Rate Last Admin   0.9 %  sodium chloride  infusion  10 mL/hr Intravenous Once Petranker, Oren S, CRNA       acetaminophen  (TYLENOL ) tablet 650 mg  650 mg Oral Q6H PRN Hall, Carole N, DO   650 mg at 04/21/24 1236   aspirin  EC tablet 325 mg  325 mg Oral Daily Leigh Valery RAMAN, PA-C   325 mg at 04/21/24 1008   bisacodyl (DULCOLAX) suppository 10 mg  10 mg Rectal Daily PRN Leigh Valery S, PA-C       docusate sodium  (COLACE) capsule 100 mg  100 mg Oral BID Leigh Valery S, PA-C   100 mg at 04/21/24 1008   ezetimibe  (ZETIA ) tablet 10 mg  10 mg Oral Daily Shona Laurence N, DO   10 mg at 04/21/24 1008   insulin  aspart (novoLOG ) injection 0-5 Units  0-5 Units Subcutaneous QHS Shona Laurence N, DO   2 Units at 04/19/24 0102   insulin  aspart (novoLOG ) injection 0-9 Units  0-9 Units Subcutaneous TID WC Shona Laurence N, DO   2 Units at 04/21/24 1235   insulin  glargine (LANTUS ) injection 30 Units  30 Units  Subcutaneous Daily Pahwani, Ravi, MD   30 Units at 04/21/24 1015   lactated ringers  infusion   Intravenous Continuous Leigh Valery RAMAN, PA-C 50 mL/hr at 04/19/24 2021 New Bag at 04/19/24 2021   melatonin tablet 5 mg  5 mg Oral QHS PRN Hall, Carole N, DO   5 mg at 04/20/24 2318   menthol (CEPACOL) lozenge 3 mg  1 lozenge Oral PRN Leigh Valery RAMAN, PA-C       Or   phenol (CHLORASEPTIC) mouth spray 1 spray  1 spray Mouth/Throat PRN Leigh Valery RAMAN, PA-C       methocarbamol  (ROBAXIN ) tablet 500 mg  500 mg Oral Q6H PRN Leigh Valery RAMAN, PA-C   500 mg at 04/20/24 2318   Or   methocarbamol  (ROBAXIN ) injection 500 mg  500 mg Intravenous Q6H PRN Leigh Valery RAMAN, PA-C       methocarbamol  (ROBAXIN ) tablet 500 mg  500 mg Oral Once Daniels, James K, NP       metoCLOPramide (REGLAN) tablet 5-10 mg  5-10 mg Oral Q8H PRN Hill, Avery S, PA-C       Or   metoCLOPramide (REGLAN) injection 5-10 mg  5-10 mg Intravenous Q8H PRN Hill, Avery S, PA-C       polyethylene glycol (MIRALAX / GLYCOLAX) packet 17 g  17 g Oral Daily PRN Shona Laurence N, DO       prochlorperazine (COMPAZINE) injection 5 mg  5 mg Intravenous Q6H PRN Hall, Carole N, DO       QUEtiapine (SEROQUEL) tablet 25 mg  25 mg Oral Q24H Pahwani, Ravi, MD         Discharge Medications: Please see discharge summary for a list of discharge medications.  Relevant Imaging Results:  Relevant Lab Results:   Additional Information    Johnathin Vanderschaaf, LCSW

## 2024-04-22 DIAGNOSIS — S72101A Unspecified trochanteric fracture of right femur, initial encounter for closed fracture: Secondary | ICD-10-CM | POA: Diagnosis not present

## 2024-04-22 LAB — GLUCOSE, CAPILLARY
Glucose-Capillary: 194 mg/dL — ABNORMAL HIGH (ref 70–99)
Glucose-Capillary: 197 mg/dL — ABNORMAL HIGH (ref 70–99)

## 2024-04-22 NOTE — Plan of Care (Signed)
  Problem: Education: Goal: Ability to describe self-care measures that may prevent or decrease complications (Diabetes Survival Skills Education) will improve Outcome: Progressing   Problem: Pain Managment: Goal: General experience of comfort will improve and/or be controlled Outcome: Progressing   Problem: Safety: Goal: Ability to remain free from injury will improve Outcome: Progressing

## 2024-04-22 NOTE — Plan of Care (Signed)
  Problem: Education: Goal: Ability to describe self-care measures that may prevent or decrease complications (Diabetes Survival Skills Education) will improve Outcome: Adequate for Discharge Goal: Individualized Educational Video(s) Outcome: Adequate for Discharge   Problem: Coping: Goal: Ability to adjust to condition or change in health will improve Outcome: Adequate for Discharge   Problem: Fluid Volume: Goal: Ability to maintain a balanced intake and output will improve Outcome: Adequate for Discharge   Problem: Health Behavior/Discharge Planning: Goal: Ability to identify and utilize available resources and services will improve Outcome: Adequate for Discharge Goal: Ability to manage health-related needs will improve Outcome: Adequate for Discharge   Problem: Metabolic: Goal: Ability to maintain appropriate glucose levels will improve Outcome: Adequate for Discharge   Problem: Nutritional: Goal: Maintenance of adequate nutrition will improve Outcome: Adequate for Discharge Goal: Progress toward achieving an optimal weight will improve Outcome: Adequate for Discharge   Problem: Skin Integrity: Goal: Risk for impaired skin integrity will decrease Outcome: Adequate for Discharge   Problem: Tissue Perfusion: Goal: Adequacy of tissue perfusion will improve Outcome: Adequate for Discharge   Problem: Education: Goal: Knowledge of General Education information will improve Description: Including pain rating scale, medication(s)/side effects and non-pharmacologic comfort measures Outcome: Adequate for Discharge   Problem: Health Behavior/Discharge Planning: Goal: Ability to manage health-related needs will improve Outcome: Adequate for Discharge   Problem: Clinical Measurements: Goal: Ability to maintain clinical measurements within normal limits will improve Outcome: Adequate for Discharge Goal: Will remain free from infection Outcome: Adequate for Discharge Goal:  Diagnostic test results will improve Outcome: Adequate for Discharge Goal: Respiratory complications will improve Outcome: Adequate for Discharge Goal: Cardiovascular complication will be avoided Outcome: Adequate for Discharge   Problem: Activity: Goal: Risk for activity intolerance will decrease Outcome: Adequate for Discharge   Problem: Nutrition: Goal: Adequate nutrition will be maintained Outcome: Adequate for Discharge   Problem: Coping: Goal: Level of anxiety will decrease Outcome: Adequate for Discharge   Problem: Elimination: Goal: Will not experience complications related to bowel motility Outcome: Adequate for Discharge Goal: Will not experience complications related to urinary retention Outcome: Adequate for Discharge   Problem: Pain Managment: Goal: General experience of comfort will improve and/or be controlled Outcome: Adequate for Discharge   Problem: Safety: Goal: Ability to remain free from injury will improve Outcome: Adequate for Discharge   Problem: Skin Integrity: Goal: Risk for impaired skin integrity will decrease Outcome: Adequate for Discharge   Problem: Education: Goal: Verbalization of understanding the information provided (i.e., activity precautions, restrictions, etc) will improve Outcome: Adequate for Discharge Goal: Individualized Educational Video(s) Outcome: Adequate for Discharge   Problem: Activity: Goal: Ability to ambulate and perform ADLs will improve Outcome: Adequate for Discharge   Problem: Clinical Measurements: Goal: Postoperative complications will be avoided or minimized Outcome: Adequate for Discharge   Problem: Self-Concept: Goal: Ability to maintain and perform role responsibilities to the fullest extent possible will improve Outcome: Adequate for Discharge   Problem: Pain Management: Goal: Pain level will decrease Outcome: Adequate for Discharge

## 2024-04-22 NOTE — Discharge Summary (Signed)
 Physician Discharge Summary  Samuel Ramos. FMW:[id number] DOB: October 20, 1940 DOA: 04/18/2024  PCP: Charlott Dorn LABOR, MD  Admit date: 04/18/2024 Discharge date: 04/22/2024 30 Day Unplanned Readmission Risk Score    Flowsheet Row ED to Hosp-Admission (Current) from 04/18/2024 in [facility]  30 Day Unplanned Readmission Risk Score (%) 15.04 Filed at 04/22/2024 0800    This score is the patient's risk of an unplanned readmission within 30 days of being discharged (0 -100%). The score is based on dignosis, age, lab data, medications, orders, and past utilization.   Low:  0-14.9   Medium: 15-21.9   High: 22-29.9   Extreme: 30 and above          Admitted From: [facility] Disposition: [facility]  Recommendations for Outpatient Follow-up:  Follow up with PCP in 1-2 weeks Please obtain BMP/CBC in one week Follow-up with Dr. Kendal in 2 weeks Please follow up with your PCP on the following pending results: Unresulted Labs (From admission, onward)     Start     Ordered   04/20/24 0500  CBC  Daily,   R       Comments: For 3 days.    04/19/24 2119              Home Health: None Equipment/Devices: None  Discharge Condition: Stable CODE STATUS: Full code Diet recommendation:  Diet Order             Diet Carb Modified Fluid consistency: Thin; Room service appropriate? Yes  Diet effective now                   Subjective: Seen and examined, nurse at the bedside.  Patient has no complaints.  He is fully alert and oriented today.  Brief/Interim Summary: Samuel Mahabir. is a 83 y.o. male with medical history significant for type 2 diabetes, hyperlipidemia, prior history of tibia/fibula fracture in his 76s, who presented to the ER after a mechanical fall, was hemodynamically stable upon arrival to the ED and was eventually diagnosed with acute displaced right intratrochanteric femoral fracture extending to the proximal  femoral shaft.   Acute displaced right intratrochanteric femoral fracture extending to the proximal femoral shaft secondary to mechanical fall. Orthopedic surgery on board.  Status post intramedullary fixation right femur 04/19/2024.  Seen by PT OT, SNF recommended. Patient on aspirin  for DVT prophylaxis per orthopedics management per orthopedics.  Patient is going to be discharged to SNF today.   Uncontrolled type 2 diabetes with hyperglycemia: hemoglobin A1c 9.0 here.  Appears to be on multiple oral medications as well as Lantus  at home.  Resume home medications.   Hyperlipidemia  Zetia    Chronic intermittent thrombocytopenia Platelet count 127 which dropped to 98 day before yesterday but improved to 114 yesterday.  No signs of bleeding and hemoglobin is stable.   Delirium: Patient was delirious yesterday, likely due to lack of sleep.  Given Seroquel last night.  Good sleep.  Fully alert and oriented today.  Delirium resolved.  Discharge Diagnoses:  Principal Problem:   Trochanteric fracture [facility])    Discharge Instructions   Allergies as of 04/22/2024       Reactions   Januvia [sitagliptin] Other (See Comments)   Pancreatitis   Gabapentin Other (See Comments)   Fatigue and cramps    Vicodin [hydrocodone-acetaminophen ] Other (See Comments)   Nightmares        Medication List     STOP taking these  medications    mupirocin  ointment 2 % Commonly known as: BACTROBAN    valACYclovir  500 MG tablet Commonly known as: VALTREX        TAKE these medications    aspirin  81 MG chewable tablet Commonly known as: Aspirin  Childrens Chew 1 tablet (81 mg total) by mouth 2 (two) times daily with a meal.   ezetimibe  10 MG tablet Commonly known as: ZETIA  Take 1 tablet (10 mg total) by mouth daily.   Farxiga  10 MG Tabs tablet Generic drug: dapagliflozin  propanediol Take 1 tablet (10 mg total) by mouth daily.   glimepiride  4 MG tablet Commonly known as: AMARYL  Take 4 mg by  mouth 2 (two) times daily. What changed: Another medication with the same name was removed. Continue taking this medication, and follow the directions you see here.   lisinopril 5 MG tablet Commonly known as: ZESTRIL Take 5 mg by mouth every evening.   meloxicam  15 MG tablet Commonly known as: MOBIC  Take 1 tablet (15 mg total) by mouth daily  with food for inflammation   metFORMIN  500 MG 24 hr tablet Commonly known as: GLUCOPHAGE -XR Take 2 tablets (1,000 mg total) by mouth 2 (two) times daily at 8 am and 10 pm.   methocarbamol  500 MG tablet Commonly known as: ROBAXIN  Take 1-2 tablets (500-1,000 mg total) by mouth every 6 (six) to 8 (hours) as needed for muscle spasms   ONE TOUCH ULTRA 2 w/Device Kit Use as directed   OneTouch Delica Plus Lancet30G Misc Use upto 2 (two) times daily to check blood sugar   OneTouch Ultra test strip Generic drug: glucose blood TEST UP TO FOUR TIMES DAILY.   oxyCODONE  5 MG immediate release tablet Commonly known as: Roxicodone  Take 1 tablet (5 mg total) by mouth every 4 (four) hours as needed.   Repatha  SureClick 140 MG/ML Soaj Generic drug: Evolocumab  Inject 140 mg into the skin every 14 (fourteen) days.   saccharomyces boulardii 250 MG capsule Commonly known as: FLORASTOR Take 250 mg by mouth daily.   TechLite Pen Needles 32G X 4 MM Misc Generic drug: Insulin  Pen Needle Use to check blood sugar once daily   Toujeo  SoloStar 300 UNIT/ML Solostar Pen Generic drug: insulin  glargine (1 Unit Dial ) Inject 21 Units into the skin daily.        Contact information for follow-up providers     [place], Valery RAMAN, PA-C. Schedule an appointment as soon as possible for a visit in 2 week(s).   Specialty: Orthopedic Surgery Why: For suture removal, For wound re-check Contact information: [address] [place] [address] [phone]         Charlott Dorn LABOR, MD Follow up in 1 week(s).   Specialty: Internal  Medicine Contact information: [address] [place] [address] [phone]              Contact information for after-discharge care     Destination     [facility] .   Service: Skilled Nursing Contact information: [address] [place]  [address] [phone]                    Allergies  Allergen Reactions   Januvia [Sitagliptin] Other (See Comments)    Pancreatitis    Gabapentin Other (See Comments)    Fatigue and cramps    Vicodin [Hydrocodone-Acetaminophen ] Other (See Comments)    Nightmares    Consultations: Orthopedics   Procedures/Studies: DG FEMUR, MIN 2 VIEWS RIGHT Result  Date: 04/19/2024 CLINICAL DATA:  Closed comminuted inter trochanteric fracture of the proximal right femur. Postop right IM nail. EXAM: RIGHT FEMUR 2 VIEWS COMPARISON:  04/18/2024 FINDINGS: Inter trochanteric and subtrochanteric fractures again demonstrated in the proximal right femur. Interval reduction of the fractures with near anatomic alignment demonstrated. There is residual displacement of lesser trochanteric fragments. Interval placement of internal fixation using an intramedullary rod with compression bold and distal locking screw. Skin clips and soft tissue gas are consistent with recent surgery. Degenerative changes in the right hip. IMPRESSION: Postoperative changes with internal fixation of comminuted fractures of the inter trochanteric right proximal femur. Near anatomic alignment is demonstrated. Electronically Signed   By: Elsie Gravely M.D.   On: 04/19/2024 20:37   DG HIP UNILAT WITH PELVIS 2-3 VIEWS RIGHT Result Date: 04/19/2024 CLINICAL DATA:  Elective surgery.  Intraoperative right IM nail. EXAM: DG HIP (WITH OR WITHOUT PELVIS) 2-3V RIGHT COMPARISON:  04/18/2024 FINDINGS: Intraoperative fluoroscopy is obtained for surgical control purposes. Fluoroscopy time is recorded at 1 minute 22 seconds. Dose 7.9955 mGy. 8 spot  fluoroscopic images are obtained. Spot fluoroscopic images obtained initially demonstrate a comminuted inter trochanteric and subtrochanteric fracture of the proximal right femur. Degenerative changes in the right hip. Subsequent images demonstrate reduction and internal fixation of the fractures using a compression bold with long-stem femoral intramedullary rod. A distal locking screw is placed. Degenerative changes are noted in the right knee. IMPRESSION: Intraoperative fluoroscopy is utilized for surgical control purposes demonstrating internal fixation of inter trochanteric and subtrochanteric fractures of the right proximal femur. Electronically Signed   By: Elsie Gravely M.D.   On: 04/19/2024 19:27   DG C-Arm 1-60 Min-No Report Result Date: 04/19/2024 Fluoroscopy was utilized by the requesting physician.  No radiographic interpretation.   DG C-Arm 1-60 Min-No Report Result Date: 04/19/2024 Fluoroscopy was utilized by the requesting physician.  No radiographic interpretation.   DG Knee Right Port Result Date: 04/19/2024 CLINICAL DATA:  Right hip fracture. EXAM: PORTABLE RIGHT KNEE - 1-2 VIEW COMPARISON:  04/18/2024. FINDINGS: No acute osseous or joint abnormality. Degenerative changes in the knee. IMPRESSION: No acute findings. Electronically Signed   By: Newell Eke M.D.   On: 04/19/2024 09:14   CT Hip Right Wo Contrast Result Date: 04/18/2024 EXAM: CT OF THE RIGHT HIP WITHOUT IV CONTRAST 04/18/2024 10: 58:43 PM TECHNIQUE: CT of the right hip was performed without the administration of intravenous contrast. Multiplanar reformatted images are provided for review. Automated exposure control, iterative reconstruction, and/or weight based adjustment of the mA/kV was utilized to reduce the radiation dose to as low as reasonably achievable. COMPARISON: None available. CLINICAL HISTORY: Right hip fracture, operative planning. Per chart: Patient brought in from home, had a trip and fall while  walking his dog, falling onto his right side. Presents with right hip pain and radiating pain down the leg. FINDINGS: BONES: There is an acute, comminuted, impacted fracture of the intertrochanteric/subtrochanteric region of the right hip with avulsion of the lesser trochanter. The dominant fracture planes extend obliquely within the intertrochanteric region, extending into the posterior cortex of the proximal femoral diaphysis, best seen on sagittal image 45, series 9. There is impaction at the intertrochanteric fracture plane with moderate varus angulation. SOFT TISSUE: Mild vascular calcifications are noted. JOINT: The femoral head is still seated within the right acetabulum with moderate superimposed degenerative arthritis of the right hip. INTRAPELVIC CONTENTS: Incidental note of distal colonic diverticulosis. IMPRESSION: 1. Acute, comminuted, impacted fracture of the intertrochanteric/subtrochanteric region  of the right hip with avulsion of the lesser trochanter and moderate varus angulation. 2. Moderate degenerative arthritis of the right hip. Electronically signed by: Dorethia Molt MD 04/18/2024 11:06 PM EDT RP Workstation: [id number]   DG Lumbar Spine 2-3 Views Result Date: 04/18/2024 CLINICAL DATA:  [id number] Fall [id number] EXAM: LUMBAR SPINE - 2-3 VIEW COMPARISON:  X-ray lumbar spine 05/04/2018 FINDINGS: There is no evidence of lumbar spine fracture. Multilevel moderate degenerative change of the spine. Alignment is normal. Intervertebral disc spaces are maintained. Atherosclerotic plaque. IMPRESSION: No acute displaced fracture or traumatic listhesis of the lumbar spine. Electronically Signed   By: Morgane  Naveau M.D.   On: 04/18/2024 20:41   DG Chest Port 1 View Result Date: 04/18/2024 CLINICAL DATA:  fall, pain Pt fell while walking his dog this evening, pt fell on right hip. Pt c/o right hip pain radiating down leg. EXAM: PORTABLE CHEST 1 VIEW COMPARISON:  Chest x-ray 08/19/2022. FINDINGS: The heart  and mediastinal contours are unchanged. Atherosclerotic plaque. No focal consolidation. No pulmonary edema. No pleural effusion. No pneumothorax. No acute osseous abnormality. IMPRESSION: 1. No active disease. 2.  Aortic Atherosclerosis (ICD10-I70.0). Electronically Signed   By: Morgane  Naveau M.D.   On: 04/18/2024 20:38   DG Hip Unilat With Pelvis 2-3 Views Right Result Date: 04/18/2024 CLINICAL DATA:  fall, pain EXAM: DG HIP (WITH OR WITHOUT PELVIS) 2-3V RIGHT COMPARISON:  CT abdomen pelvis 01/06/2013 FINDINGS: Acute displaced right intratrochanteric femoral fracture extending to the proximal femoral shaft. No right hip dislocation. No acute displaced fracture or dislocation of the left hip on frontal view. Question avascular necrosis of the left femoral head. There is no evidence of severe arthropathy or other focal bone abnormality. IMPRESSION: Acute displaced right intratrochanteric femoral fracture extending to the proximal femoral shaft. Electronically Signed   By: Morgane  Naveau M.D.   On: 04/18/2024 20:36     Discharge Exam: Vitals:   04/21/24 1956 04/22/24 0430  BP: 131/70 123/70  Pulse: 96 86  Resp: 18 18  Temp: 99.2 F (37.3 C) 98.3 F (36.8 C)  SpO2: 97% 98%   Vitals:   04/21/24 0440 04/21/24 1204 04/21/24 1956 04/22/24 0430  BP: 135/62 (!) 111/56 131/70 123/70  Pulse: 83 76 96 86  Resp: 18  18 18   Temp: 98.2 F (36.8 C) 98.6 F (37 C) 99.2 F (37.3 C) 98.3 F (36.8 C)  TempSrc:      SpO2: 97% 98% 97% 98%  Weight:      Height:        General: Pt is alert, awake, not in acute distress Cardiovascular: RRR, S1/S2 +, no rubs, no gallops Respiratory: CTA bilaterally, no wheezing, no rhonchi Abdominal: Soft, NT, ND, bowel sounds + Extremities: no edema, no cyanosis    The results of significant diagnostics from this hospitalization (including imaging, microbiology, ancillary and laboratory) are listed below for reference.     Microbiology: No results found for  this or any previous visit (from the past 240 hours).   Labs: BNP (last 3 results) No results for input(s): BNP in the last 8760 hours. Basic Metabolic Panel: Recent Labs  Lab 04/18/24 2037 04/20/24 0659  NA 140 140  K 4.0 4.1  CL 105 106  CO2 24 27  GLUCOSE 224* 157*  BUN 11 11  CREATININE 0.90 0.79  CALCIUM 8.5* 8.0*   Liver Function Tests: No results for input(s): AST, ALT, ALKPHOS, BILITOT, PROT, ALBUMIN in the last 168 hours. No results for input(s): LIPASE,  AMYLASE in the last 168 hours. No results for input(s): AMMONIA in the last 168 hours. CBC: Recent Labs  Lab 04/18/24 2037 04/19/24 1100 04/20/24 0659 04/21/24 0922  WBC 9.3 7.5 8.0 8.6  NEUTROABS 8.1* 5.7 6.3 7.4  HGB 10.2* 8.4* 9.0* 9.6*  HCT 34.4* 28.4* 30.0* 30.6*  MCV 84.1 85.3 86.2 84.1  PLT 127* 116* 98* 114*   Cardiac Enzymes: No results for input(s): CKTOTAL, CKMB, CKMBINDEX, TROPONINI in the last 168 hours. BNP: Invalid input(s): POCBNP CBG: Recent Labs  Lab 04/21/24 0734 04/21/24 1141 04/21/24 1628 04/21/24 2135 04/22/24 0757  GLUCAP 192* 180* 174* 158* 194*   D-Dimer No results for input(s): DDIMER in the last 72 hours. Hgb A1c No results for input(s): HGBA1C in the last 72 hours. Lipid Profile No results for input(s): CHOL, HDL, LDLCALC, TRIG, CHOLHDL, LDLDIRECT in the last 72 hours. Thyroid function studies No results for input(s): TSH, T4TOTAL, T3FREE, THYROIDAB in the last 72 hours.  Invalid input(s): FREET3 Anemia work up No results for input(s): VITAMINB12, FOLATE, FERRITIN, TIBC, IRON, RETICCTPCT in the last 72 hours. Urinalysis    Component Value Date/Time   COLORURINE YELLOW 10/30/2022 0625   APPEARANCEUR CLEAR 10/30/2022 0625   LABSPEC 1.023 10/30/2022 0625   PHURINE 5.5 10/30/2022 0625   GLUCOSEU 500 (A) 10/30/2022 0625   HGBUR NEGATIVE 10/30/2022 0625   BILIRUBINUR NEGATIVE 10/30/2022 0625    KETONESUR NEGATIVE 10/30/2022 0625   PROTEINUR NEGATIVE 10/30/2022 0625   UROBILINOGEN 0.2 06/10/2007 0725   NITRITE NEGATIVE 10/30/2022 0625   LEUKOCYTESUR NEGATIVE 10/30/2022 0625   Sepsis Labs Recent Labs  Lab 04/18/24 2037 04/19/24 1100 04/20/24 0659 04/21/24 0922  WBC 9.3 7.5 8.0 8.6   Microbiology No results found for this or any previous visit (from the past 240 hours).  FURTHER DISCHARGE INSTRUCTIONS:   Get Medicines reviewed and adjusted: Please take all your medications with you for your next visit with your Primary MD   Laboratory/radiological data: Please request your Primary MD to go over all hospital tests and procedure/radiological results at the follow up, please ask your Primary MD to get all Hospital records sent to his/her office.   In some cases, they will be blood work, cultures and biopsy results pending at the time of your discharge. Please request that your primary care M.D. goes through all the records of your hospital data and follows up on these results.   Also Note the following: If you experience worsening of your admission symptoms, develop shortness of breath, life threatening emergency, suicidal or homicidal thoughts you must seek medical attention immediately by calling 911 or calling your MD immediately  if symptoms less severe.   You must read complete instructions/literature along with all the possible adverse reactions/side effects for all the Medicines you take and that have been prescribed to you. Take any new Medicines after you have completely understood and accpet all the possible adverse reactions/side effects.    patient was instructed, not to drive, operate heavy machinery, perform activities at heights, swimming or participation in water activities or provide baby-sitting services while on Pain, Sleep and Anxiety Medications; until their outpatient Physician has advised to do so again. Also recommended to not to take more than prescribed  Pain, Sleep and Anxiety Medications.  It is not advisable to combine anxiety, sleep and pain medications without talking with your primary care provider.     Wear Seat belts while driving.   Please note: You were cared for by a hospitalist during  your hospital stay. Once you are discharged, your primary care physician will handle any further medical issues. Please note that NO REFILLS for any discharge medications will be authorized once you are discharged, as it is imperative that you return to your primary care physician (or establish a relationship with a primary care physician if you do not have one) for your post hospital discharge needs so that they can reassess your need for medications and monitor your lab values  Time coordinating discharge: Over 30 minutes  SIGNED:   Fredia Skeeter, MD  [facility] 04/22/2024, 9:58 AM *Please note that this is a verbal dictation therefore any spelling or grammatical errors are due to the Dragon Medical One system interpretation. If 7PM-7AM, please contact night-coverage [email or web address]

## 2024-04-22 NOTE — Progress Notes (Signed)
 Writer called [facility] to give report at approx. 233PM and no answer. Writer left message for facility to return call. Joen the nurse returned writers call at approx. 2:45PM and report was given.

## 2024-04-22 NOTE — TOC Transition Note (Signed)
 Transition of Care [facility]) - Discharge Note   Patient Details  Name: Samuel Ramos. MRN: [id number] Date of Birth: 1940/10/19  [facility] CM/SW Contact:  Sheri ONEIDA Sharps, LCSW Phone Number: 04/22/2024, 1:22 PM   Clinical Narrative:    Pt medically ready to dc back to [facility]. DC packet left at nurses station. Call report to [phone] room 151 given to nurse. PTAR called at 1:20pm. No further ICM needs.   Final next level of care: Assisted Living Barriers to Discharge: Barriers Resolved   Patient Goals and CMS Choice Patient states their goals for this hospitalization and ongoing recovery are:: [facility] CMS Medicare.gov Compare Post Acute Care list provided to::  (NA) Choice offered to / list presented to : NA [place] ownership interest in [facility].provided to:: Parent NA    Discharge Placement                Patient to be transferred to facility by: PTAR   Patient and family notified of of transfer: 04/22/24  Discharge Plan and Services Additional resources added to the After Visit Summary for   In-house Referral: NA Discharge Planning Services: CM Consult            DME Arranged: N/A DME Agency: NA       HH Arranged: NA HH Agency: NA        Social Drivers of Health (SDOH) Interventions SDOH Screenings   Food Insecurity: No Food Insecurity (04/19/2024)  Housing: Low Risk  (04/19/2024)  Transportation Needs: No Transportation Needs (04/19/2024)  Utilities: Not At Risk (04/19/2024)  Social Connections: Socially Integrated (04/19/2024)  Tobacco Use: Low Risk  (04/19/2024)     Readmission Risk Interventions    04/22/2024    1:21 PM  Readmission Risk Prevention Plan  Transportation Screening Complete  PCP or Specialist Appt within 5-7 Days Complete  Home Care Screening Complete  Medication Review (RN CM) Complete

## 2024-04-24 ENCOUNTER — Other Ambulatory Visit: Payer: Self-pay

## 2024-04-24 ENCOUNTER — Other Ambulatory Visit (HOSPITAL_BASED_OUTPATIENT_CLINIC_OR_DEPARTMENT_OTHER): Payer: Self-pay

## 2024-04-24 ENCOUNTER — Encounter: Payer: Self-pay | Admitting: Internal Medicine

## 2024-04-24 ENCOUNTER — Non-Acute Institutional Stay (SKILLED_NURSING_FACILITY): Payer: Self-pay | Admitting: Internal Medicine

## 2024-04-24 DIAGNOSIS — E119 Type 2 diabetes mellitus without complications: Secondary | ICD-10-CM

## 2024-04-24 DIAGNOSIS — G3184 Mild cognitive impairment, so stated: Secondary | ICD-10-CM | POA: Diagnosis not present

## 2024-04-24 DIAGNOSIS — Z9889 Other specified postprocedural states: Secondary | ICD-10-CM

## 2024-04-24 DIAGNOSIS — Z8781 Personal history of (healed) traumatic fracture: Secondary | ICD-10-CM

## 2024-04-24 DIAGNOSIS — E7800 Pure hypercholesterolemia, unspecified: Secondary | ICD-10-CM

## 2024-04-24 NOTE — Progress Notes (Unsigned)
 Provider:  Charlanne Cambridge, MD Location:  [facility]  Nursing Home Room Number: 151-P Place of Service:  SNF [phone])  PCP: Charlott Dorn LABOR, MD Patient Care Team: Charlott Dorn LABOR, MD as PCP - General (Internal Medicine)  Extended Emergency Contact Information Primary Emergency Contact: Mannes,Sandy Address: [address]          [place], [place] [address] [place] Mobile Phone: [phone] Relation: Spouse Secondary Emergency Contact: Deemer,Rob  [place] Home Phone: [phone] Mobile Phone: [phone] Relation: Son  Code Status: Full Code Goals of Care: Advanced Directive information    04/18/2024    7:25 PM  Advanced Directives  Does Patient Have a Medical Advance Directive? Yes  Type of Estate agent of [place];Living will  Does patient want to make changes to medical advance directive? No - Patient declined  Copy of Healthcare Power of Attorney in Chart? No - copy requested  Would patient like information on creating a medical advance directive? No - Patient declined      Chief Complaint  Patient presents with   New Admit To SNF    HPI: Patient is a 83 y.o. male seen today for admission to SNF  [place] and [place] He was admitted to the hospital from 10/7 to 10/11 after a fall and sustaining an acute displaced right intertrochanteric femoral fracture extending to the proximal femoral shaft  Patient has a history of type 2 diabetes, chronic intermittent thrombocytopenia, hyperlipidemia  He had a mechanical fall when he was walking his dog and was found to have a right and trochanteric femoral fracture.  Underwent ORIF on 10/8 with intramedullary fixation. Postop patient had episode of delirium Thought to be due to oxycodone   Patient is back to baseline here in rehab.  He does have mild cognitive impairment as noticed by nurses and his wife.   Patient did not have any acute complaints.  Per nurses he is having significant amount of pain.  The therapy has made him Deitra lift at this point due to.  Pain.  He is partial weightbearing on that leg   Past Medical History:  Diagnosis Date   BPH (benign prostatic hyperplasia)    Cancer (HCC)    melanoma left arm and back removed   Diabetes mellitus without complication (HCC)    Type II   Diverticulitis    Pancreatitis    many years ago   Renal stone    Past Surgical History:  Procedure Laterality Date   INTRAMEDULLARY (IM) NAIL INTERTROCHANTERIC Right 04/19/2024   Procedure: FIXATION, FRACTURE, INTERTROCHANTERIC, WITH INTRAMEDULLARY ROD;  Surgeon: Fidel Rogue, MD;  Location: [facility];  Service: Orthopedics;  Laterality: Right;   LUMBAR LAMINECTOMY/DECOMPRESSION MICRODISCECTOMY Left 05/04/2018   Procedure: LEFT-SIDED LUMBAR 4-5 MICRODISECTOMY;  Surgeon: Beuford Anes, MD;  Location: [facility] OR;  Service: Orthopedics;  Laterality: Left;   PROSTATECTOMY N/A 01/21/2015   Procedure: PROSTATECTOMY RETROPUBIC;  Surgeon: Arlena Gal, MD;  Location: [facility];  Service: Urology;  Laterality: N/A;   right leg broken (tibia and fibula) 1961     SHOULDER ARTHROSCOPY WITH OPEN ROTATOR CUFF REPAIR  2002   right   TONSILLECTOMY     WISDOM TOOTH EXTRACTION      reports that he has never smoked. He has never used smokeless tobacco. He reports current alcohol use. He reports that he does not use drugs. Social History   Socioeconomic History   Marital status: Married  Spouse name: Not on file   Number of children: Not on file   Years of education: Not on file   Highest education level: Not on file  Occupational History   Not on file  Tobacco Use   Smoking status: Never   Smokeless tobacco: Never  Vaping Use   Vaping status: Never Used  Substance and Sexual Activity   Alcohol use: Yes    Comment: occasional   Drug use: No   Sexual activity: Not on file  Other Topics Concern   Not  on file  Social History Narrative   Not on file   Social Drivers of Health   Financial Resource Strain: Not on file  Food Insecurity: No Food Insecurity (04/19/2024)   Hunger Vital Sign    Worried About Running Out of Food in the Last Year: Never true    Ran Out of Food in the Last Year: Never true  Transportation Needs: No Transportation Needs (04/19/2024)   PRAPARE - Administrator, Civil Service (Medical): No    Lack of Transportation (Non-Medical): No  Physical Activity: Not on file  Stress: Not on file  Social Connections: Socially Integrated (04/19/2024)   Social Connection and Isolation Panel    Frequency of Communication with Friends and Family: Three times a week    Frequency of Social Gatherings with Friends and Family: More than three times a week    Attends Religious Services: More than 4 times per year    Active Member of [employer] or Organizations: Yes    Attends Engineer, structural: More than 4 times per year    Marital Status: Married  Catering manager Violence: Not At Risk (04/19/2024)   Humiliation, Afraid, Rape, and Kick questionnaire    Fear of Current or Ex-Partner: No    Emotionally Abused: No    Physically Abused: No    Sexually Abused: No    Functional Status Survey:    Family History  Problem Relation Age of Onset   Melanoma Father    Lymphoma Sister    Uterine cancer Mother    Pancreatitis Neg Hx     Health Maintenance  Topic Date Due   Medicare Annual Wellness (AWV)  Never done   FOOT EXAM  Never done   OPHTHALMOLOGY EXAM  Never done   Diabetic kidney evaluation - Urine ACR  Never done   DTaP/Tdap/Td (1 - Tdap) Never done   Pneumococcal Vaccine: 50+ Years (1 of 2 - PCV) Never done   Zoster Vaccines- Shingrix (1 of 2) Never done   Influenza Vaccine  02/11/2024   COVID-19 Vaccine (5 - 2025-26 season) 10/01/2024   HEMOGLOBIN A1C  10/18/2024   Diabetic kidney evaluation - eGFR measurement  04/20/2025   Meningococcal B  Vaccine  Aged Out    Allergies  Allergen Reactions   Januvia [Sitagliptin] Other (See Comments)    Pancreatitis    Gabapentin Other (See Comments)    Fatigue and cramps    Vicodin [Hydrocodone-Acetaminophen ] Other (See Comments)    Nightmares    Allergies as of 04/24/2024       Reactions   Januvia [sitagliptin] Other (See Comments)   Pancreatitis   Gabapentin Other (See Comments)   Fatigue and cramps    Vicodin [hydrocodone-acetaminophen ] Other (See Comments)   Nightmares        Medication List        Accurate as of April 24, 2024  1:01 PM. If you have any questions, ask  your nurse or doctor.          aspirin  81 MG chewable tablet Commonly known as: Aspirin  Childrens Chew 1 tablet (81 mg total) by mouth 2 (two) times daily with a meal.   ezetimibe  10 MG tablet Commonly known as: ZETIA  Take 1 tablet (10 mg total) by mouth daily.   Farxiga  10 MG Tabs tablet Generic drug: dapagliflozin  propanediol Take 1 tablet (10 mg total) by mouth daily.   glimepiride  4 MG tablet Commonly known as: AMARYL  Take 4 mg by mouth 2 (two) times daily.   lisinopril 5 MG tablet Commonly known as: ZESTRIL Take 5 mg by mouth every evening.   meloxicam  15 MG tablet Commonly known as: MOBIC  Take 1 tablet (15 mg total) by mouth daily with food for inflammation   metFORMIN  500 MG 24 hr tablet Commonly known as: GLUCOPHAGE -XR Take 2 tablets (1,000 mg total) by mouth 2 (two) times daily at 8 am and 10 pm.   methocarbamol  500 MG tablet Commonly known as: ROBAXIN  Take 1-2 tablets (500-1,000 mg total) by mouth every 6 (six) to 8 (hours) as needed for muscle spasms   ONE TOUCH ULTRA 2 w/Device Kit Use as directed   OneTouch Delica Plus Lancet30G Misc Use upto 2 (two) times daily to check blood sugar   OneTouch Ultra test strip Generic drug: glucose blood TEST UP TO FOUR TIMES DAILY.   oxyCODONE  5 MG immediate release tablet Commonly known as: Roxicodone  Take 1 tablet (5  mg total) by mouth every 4 (four) hours as needed.   Repatha  SureClick 140 MG/ML Soaj Generic drug: Evolocumab  Inject 140 mg into the skin every 14 (fourteen) days.   saccharomyces boulardii 250 MG capsule Commonly known as: FLORASTOR Take 250 mg by mouth daily.   TechLite Pen Needles 32G X 4 MM Misc Generic drug: Insulin  Pen Needle Use to check blood sugar once daily   Toujeo  SoloStar 300 UNIT/ML Solostar Pen Generic drug: insulin  glargine (1 Unit Dial ) Inject 21 Units into the skin daily.        Review of Systems  Constitutional:  Negative for activity change, appetite change and unexpected weight change.  HENT: Negative.    Respiratory:  Negative for cough and shortness of breath.   Cardiovascular:  Negative for leg swelling.  Gastrointestinal:  Negative for constipation.  Genitourinary:  Negative for frequency.  Musculoskeletal:  Positive for arthralgias, gait problem and myalgias.  Skin: Negative.  Negative for rash.  Neurological:  Negative for dizziness and weakness.  Psychiatric/Behavioral:  Positive for confusion. Negative for sleep disturbance.   All other systems reviewed and are negative.   Vitals:   04/24/24 1259  BP: 118/66  Pulse: 81  Resp: 16  Temp: 97.9 F (36.6 C)  SpO2: 95%  Weight: 183 lb 6.4 oz (83.2 kg)  Height: 6' 2 (1.88 m)   Body mass index is 23.55 kg/m. Physical Exam Vitals reviewed.  Constitutional:      Appearance: Normal appearance.  HENT:     Head: Normocephalic.     Nose: Nose normal.     Mouth/Throat:     Mouth: Mucous membranes are moist.     Pharynx: Oropharynx is clear.  Eyes:     Pupils: Pupils are equal, round, and reactive to light.  Cardiovascular:     Rate and Rhythm: Normal rate and regular rhythm.     Pulses: Normal pulses.     Heart sounds: No murmur heard. Pulmonary:     Effort: Pulmonary effort  is normal. No respiratory distress.     Breath sounds: Normal breath sounds. No rales.  Abdominal:      General: Abdomen is flat. Bowel sounds are normal.     Palpations: Abdomen is soft.  Musculoskeletal:        General: No swelling.     Cervical back: Neck supple.  Skin:    General: Skin is warm.  Neurological:     General: No focal deficit present.     Mental Status: He is alert and oriented to person, place, and time.  Psychiatric:        Mood and Affect: Mood normal.        Thought Content: Thought content normal.     Labs reviewed: Basic Metabolic Panel: Recent Labs    04/18/24 2037 04/20/24 0659  NA 140 140  K 4.0 4.1  CL 105 106  CO2 24 27  GLUCOSE 224* 157*  BUN 11 11  CREATININE 0.90 0.79  CALCIUM 8.5* 8.0*   Liver Function Tests: No results for input(s): AST, ALT, ALKPHOS, BILITOT, PROT, ALBUMIN in the last 8760 hours. No results for input(s): LIPASE, AMYLASE in the last 8760 hours. No results for input(s): AMMONIA in the last 8760 hours. CBC: Recent Labs    04/19/24 1100 04/20/24 0659 04/21/24 0922  WBC 7.5 8.0 8.6  NEUTROABS 5.7 6.3 7.4  HGB 8.4* 9.0* 9.6*  HCT 28.4* 30.0* 30.6*  MCV 85.3 86.2 84.1  PLT 116* 98* 114*   Cardiac Enzymes: No results for input(s): CKTOTAL, CKMB, CKMBINDEX, TROPONINI in the last 8760 hours. BNP: Invalid input(s): POCBNP Lab Results  Component Value Date   HGBA1C 9.0 (H) 04/19/2024   No results found for: TSH No results found for: VITAMINB12 No results found for: FOLATE No results found for: IRON, TIBC, FERRITIN  Imaging and Procedures obtained prior to SNF admission: DG FEMUR, MIN 2 VIEWS RIGHT Result Date: 04/19/2024 CLINICAL DATA:  Closed comminuted inter trochanteric fracture of the proximal right femur. Postop right IM nail. EXAM: RIGHT FEMUR 2 VIEWS COMPARISON:  04/18/2024 FINDINGS: Inter trochanteric and subtrochanteric fractures again demonstrated in the proximal right femur. Interval reduction of the fractures with near anatomic alignment demonstrated. There is  residual displacement of lesser trochanteric fragments. Interval placement of internal fixation using an intramedullary rod with compression bold and distal locking screw. Skin clips and soft tissue gas are consistent with recent surgery. Degenerative changes in the right hip. IMPRESSION: Postoperative changes with internal fixation of comminuted fractures of the inter trochanteric right proximal femur. Near anatomic alignment is demonstrated. Electronically Signed   By: Elsie Gravely M.D.   On: 04/19/2024 20:37   DG HIP UNILAT WITH PELVIS 2-3 VIEWS RIGHT Result Date: 04/19/2024 CLINICAL DATA:  Elective surgery.  Intraoperative right IM nail. EXAM: DG HIP (WITH OR WITHOUT PELVIS) 2-3V RIGHT COMPARISON:  04/18/2024 FINDINGS: Intraoperative fluoroscopy is obtained for surgical control purposes. Fluoroscopy time is recorded at 1 minute 22 seconds. Dose 7.9955 mGy. 8 spot fluoroscopic images are obtained. Spot fluoroscopic images obtained initially demonstrate a comminuted inter trochanteric and subtrochanteric fracture of the proximal right femur. Degenerative changes in the right hip. Subsequent images demonstrate reduction and internal fixation of the fractures using a compression bold with long-stem femoral intramedullary rod. A distal locking screw is placed. Degenerative changes are noted in the right knee. IMPRESSION: Intraoperative fluoroscopy is utilized for surgical control purposes demonstrating internal fixation of inter trochanteric and subtrochanteric fractures of the right proximal femur. Electronically Signed  By: Elsie Gravely M.D.   On: 04/19/2024 19:27   DG C-Arm 1-60 Min-No Report Result Date: 04/19/2024 Fluoroscopy was utilized by the requesting physician.  No radiographic interpretation.   DG C-Arm 1-60 Min-No Report Result Date: 04/19/2024 Fluoroscopy was utilized by the requesting physician.  No radiographic interpretation.   DG Knee Right Port Result Date: 04/19/2024 CLINICAL  DATA:  Right hip fracture. EXAM: PORTABLE RIGHT KNEE - 1-2 VIEW COMPARISON:  04/18/2024. FINDINGS: No acute osseous or joint abnormality. Degenerative changes in the knee. IMPRESSION: No acute findings. Electronically Signed   By: Newell Eke M.D.   On: 04/19/2024 09:14   CT Hip Right Wo Contrast Result Date: 04/18/2024 EXAM: CT OF THE RIGHT HIP WITHOUT IV CONTRAST 04/18/2024 10: 58:43 PM TECHNIQUE: CT of the right hip was performed without the administration of intravenous contrast. Multiplanar reformatted images are provided for review. Automated exposure control, iterative reconstruction, and/or weight based adjustment of the mA/kV was utilized to reduce the radiation dose to as low as reasonably achievable. COMPARISON: None available. CLINICAL HISTORY: Right hip fracture, operative planning. Per chart: Patient brought in from home, had a trip and fall while walking his dog, falling onto his right side. Presents with right hip pain and radiating pain down the leg. FINDINGS: BONES: There is an acute, comminuted, impacted fracture of the intertrochanteric/subtrochanteric region of the right hip with avulsion of the lesser trochanter. The dominant fracture planes extend obliquely within the intertrochanteric region, extending into the posterior cortex of the proximal femoral diaphysis, best seen on sagittal image 45, series 9. There is impaction at the intertrochanteric fracture plane with moderate varus angulation. SOFT TISSUE: Mild vascular calcifications are noted. JOINT: The femoral head is still seated within the right acetabulum with moderate superimposed degenerative arthritis of the right hip. INTRAPELVIC CONTENTS: Incidental note of distal colonic diverticulosis. IMPRESSION: 1. Acute, comminuted, impacted fracture of the intertrochanteric/subtrochanteric region of the right hip with avulsion of the lesser trochanter and moderate varus angulation. 2. Moderate degenerative arthritis of the right hip.  Electronically signed by: Dorethia Molt MD 04/18/2024 11:06 PM EDT RP Workstation: [id number]   DG Lumbar Spine 2-3 Views Result Date: 04/18/2024 CLINICAL DATA:  [id number] Fall [id number] EXAM: LUMBAR SPINE - 2-3 VIEW COMPARISON:  X-ray lumbar spine 05/04/2018 FINDINGS: There is no evidence of lumbar spine fracture. Multilevel moderate degenerative change of the spine. Alignment is normal. Intervertebral disc spaces are maintained. Atherosclerotic plaque. IMPRESSION: No acute displaced fracture or traumatic listhesis of the lumbar spine. Electronically Signed   By: Morgane  Naveau M.D.   On: 04/18/2024 20:41   DG Chest Port 1 View Result Date: 04/18/2024 CLINICAL DATA:  fall, pain Pt fell while walking his dog this evening, pt fell on right hip. Pt c/o right hip pain radiating down leg. EXAM: PORTABLE CHEST 1 VIEW COMPARISON:  Chest x-ray 08/19/2022. FINDINGS: The heart and mediastinal contours are unchanged. Atherosclerotic plaque. No focal consolidation. No pulmonary edema. No pleural effusion. No pneumothorax. No acute osseous abnormality. IMPRESSION: 1. No active disease. 2.  Aortic Atherosclerosis (ICD10-I70.0). Electronically Signed   By: Morgane  Naveau M.D.   On: 04/18/2024 20:38   DG Hip Unilat With Pelvis 2-3 Views Right Result Date: 04/18/2024 CLINICAL DATA:  fall, pain EXAM: DG HIP (WITH OR WITHOUT PELVIS) 2-3V RIGHT COMPARISON:  CT abdomen pelvis 01/06/2013 FINDINGS: Acute displaced right intratrochanteric femoral fracture extending to the proximal femoral shaft. No right hip dislocation. No acute displaced fracture or dislocation of the left hip on frontal  view. Question avascular necrosis of the left femoral head. There is no evidence of severe arthropathy or other focal bone abnormality. IMPRESSION: Acute displaced right intratrochanteric femoral fracture extending to the proximal femoral shaft. Electronically Signed   By: Morgane  Naveau M.D.   On: 04/18/2024 20:36    Assessment/Plan 1. S/P  ORIF of Right hip (Primary) Change Tramadol  to 50 mg Q6 For Better pain Control Schedeule Tylenol  1000mg  TID On Robaxin  and Meloxicam  Asiprin BID PWB  2. Diabetes mellitus without complication (HCC) Insulin ,Amaryl  and Farxiga  and Metformin  CBGS monitoring Also Wrote for Loose Sliding scale   3. Hypercholesterolemia Repatha  and Zetia  4 MCI MMSE 23/30 Contineu to monitor Avoid Narcotics    Family/ staff Communication:   Labs/tests ordered:

## 2024-04-25 ENCOUNTER — Other Ambulatory Visit (HOSPITAL_BASED_OUTPATIENT_CLINIC_OR_DEPARTMENT_OTHER): Payer: Self-pay

## 2024-04-25 ENCOUNTER — Other Ambulatory Visit (HOSPITAL_COMMUNITY): Payer: Self-pay

## 2024-04-25 ENCOUNTER — Other Ambulatory Visit: Payer: Self-pay

## 2024-04-25 DIAGNOSIS — Z8781 Personal history of (healed) traumatic fracture: Secondary | ICD-10-CM | POA: Insufficient documentation

## 2024-04-25 MED ORDER — VALACYCLOVIR HCL 500 MG PO TABS
500.0000 mg | ORAL_TABLET | Freq: Every day | ORAL | 3 refills | Status: DC | PRN
  Filled 2024-04-25: qty 5, 5d supply, fill #0

## 2024-04-25 MED ORDER — DAPAGLIFLOZIN PROPANEDIOL 10 MG PO TABS
10.0000 mg | ORAL_TABLET | Freq: Every day | ORAL | 0 refills | Status: AC
  Filled 2024-04-25: qty 90, 90d supply, fill #0
  Filled 2024-04-26 (×2): qty 30, 30d supply, fill #0
  Filled 2024-04-26: qty 90, 90d supply, fill #0
  Filled 2024-06-27: qty 30, 30d supply, fill #0
  Filled 2024-06-27: qty 90, 90d supply, fill #0
  Filled 2024-06-28: qty 30, 30d supply, fill #0
  Filled 2024-07-27: qty 30, 30d supply, fill #1

## 2024-04-25 MED ORDER — TRAMADOL HCL 25 MG PO TABS: 50.0000 mg | ORAL_TABLET | Freq: Four times a day (QID) | ORAL | 1 refills | Status: AC | PRN

## 2024-04-26 ENCOUNTER — Other Ambulatory Visit (HOSPITAL_COMMUNITY): Payer: Self-pay

## 2024-04-26 ENCOUNTER — Other Ambulatory Visit: Payer: Self-pay

## 2024-04-27 ENCOUNTER — Encounter: Payer: Self-pay | Admitting: Adult Health

## 2024-04-27 ENCOUNTER — Other Ambulatory Visit: Payer: Self-pay

## 2024-04-27 ENCOUNTER — Non-Acute Institutional Stay (SKILLED_NURSING_FACILITY): Payer: Self-pay | Admitting: Adult Health

## 2024-04-27 DIAGNOSIS — S72144D Nondisplaced intertrochanteric fracture of right femur, subsequent encounter for closed fracture with routine healing: Secondary | ICD-10-CM | POA: Diagnosis not present

## 2024-04-27 DIAGNOSIS — I951 Orthostatic hypotension: Secondary | ICD-10-CM

## 2024-04-27 DIAGNOSIS — R410 Disorientation, unspecified: Secondary | ICD-10-CM

## 2024-04-27 DIAGNOSIS — B356 Tinea cruris: Secondary | ICD-10-CM

## 2024-04-27 LAB — BASIC METABOLIC PANEL WITH GFR
BUN: 22 — AB (ref 4–21)
CO2: 24 — AB (ref 13–22)
Chloride: 102 (ref 99–108)
Creatinine: 0.7 (ref 0.6–1.3)
Glucose: 58
Potassium: 4.2 meq/L (ref 3.5–5.1)
Sodium: 136 — AB (ref 137–147)

## 2024-04-27 LAB — CBC AND DIFFERENTIAL
HCT: 28 — AB (ref 41–53)
Hemoglobin: 9.3 — AB (ref 13.5–17.5)
Platelets: 307 K/uL (ref 150–400)
WBC: 10.2

## 2024-04-27 LAB — COMPREHENSIVE METABOLIC PANEL WITH GFR
Calcium: 8 — AB (ref 8.7–10.7)
eGFR: 90

## 2024-04-27 LAB — CBC: RBC: 3.43 — AB (ref 3.87–5.11)

## 2024-04-27 MED ORDER — ALPRAZOLAM 0.25 MG PO TABS: 0.2500 mg | ORAL_TABLET | Freq: Every evening | ORAL | 0 refills | Status: DC | PRN

## 2024-04-27 MED ORDER — FAMOTIDINE 20 MG PO TABS: 20.0000 mg | ORAL_TABLET | Freq: Every day | ORAL | Status: AC

## 2024-04-27 MED ORDER — NYSTATIN-TRIAMCINOLONE 100000-0.1 UNIT/GM-% EX OINT: 1.0000 | TOPICAL_OINTMENT | Freq: Two times a day (BID) | CUTANEOUS | Status: AC

## 2024-04-27 MED ORDER — FAMOTIDINE 20 MG PO TABS: 20.0000 mg | ORAL_TABLET | Freq: Two times a day (BID) | ORAL | Status: DC

## 2024-04-27 NOTE — Progress Notes (Signed)
 Location:  Medical illustrator of Service:  SNF (31) Provider:   Bari [place], ANP [facility] [phone]   Charlott Dorn LABOR, MD  Patient Care Team: Charlott Dorn LABOR, MD as PCP - General (Internal Medicine)  Extended Emergency Contact Information Primary Emergency Contact: Deshmukh,Sandy Address: [address]          [place], [place] [address] [place] Mobile Phone: [phone] Relation: Spouse Secondary Emergency Contact: Novitsky,Rob  [place] Home Phone: [phone] Mobile Phone: [phone] Relation: Son  Code Status:  Full Goals of care: Advanced Directive information    04/18/2024    7:25 PM  Advanced Directives  Does Patient Have a Medical Advance Directive? Yes  Type of Estate agent of [place];Living will  Does patient want to make changes to medical advance directive? No - Patient declined  Copy of Healthcare Power of Attorney in Chart? No - copy requested  Would patient like information on creating a medical advance directive? No - Patient declined     Chief Complaint  Patient presents with   Acute Visit    Low bp, rash, confusion, not sleeping    HPI:  Pt is a 83 y.o. male seen today for an acute visit for low bp when standing among others.   He currently resides in skilled rehab after a mechanical fall with intertrochanteric right femur fracture s/p intramedullary fixation 04/19/24  The nurse reports he is having issues with pain, currently using tramadol  due to avoidance of stronger opiates which called delirium. He is still confused but improving. Did have one hallucination last evening. He is also using tylenol , robaxin , and mobic . The mobic  is ordered daily. He did not use it regularly at home.  ON asa as well.   He was having some dizziness when standing with therapy and orthostatic vitals as follows 119/56 78 101/62 62 97/60 102  He is on  lisinopril which his wife said was prescribed due to his hx of DM.   There is a rash to his groin and buttocks which is itchy and red. His bottom is also painful. His wife says he is not turning often enough.   Hx of frequent platelet donations, platelets have been low and he was told not to donate anymore.  Retired priest.  Normally cognition is within limits per his wife with some occasional forgetfulness.   Past Medical History:  Diagnosis Date   BPH (benign prostatic hyperplasia)    Cancer (HCC)    melanoma left arm and back removed   Diabetes mellitus without complication (HCC)    Type II   Diverticulitis    Pancreatitis    many years ago   Renal stone    Past Surgical History:  Procedure Laterality Date   INTRAMEDULLARY (IM) NAIL INTERTROCHANTERIC Right 04/19/2024   Procedure: FIXATION, FRACTURE, INTERTROCHANTERIC, WITH INTRAMEDULLARY ROD;  Surgeon: Fidel Rogue, MD;  Location: [facility]S;  Service: Orthopedics;  Laterality: Right;   LUMBAR LAMINECTOMY/DECOMPRESSION MICRODISCECTOMY Left 05/04/2018   Procedure: LEFT-SIDED LUMBAR 4-5 MICRODISECTOMY;  Surgeon: Beuford Anes, MD;  Location: [facility] OR;  Service: Orthopedics;  Laterality: Left;   PROSTATECTOMY N/A 01/21/2015   Procedure: PROSTATECTOMY RETROPUBIC;  Surgeon: Arlena Gal, MD;  Location: [facility];  Service: Urology;  Laterality: N/A;   right leg broken (tibia and fibula) 1961     SHOULDER ARTHROSCOPY WITH OPEN ROTATOR CUFF REPAIR  2002   right   TONSILLECTOMY  WISDOM TOOTH EXTRACTION      Allergies  Allergen Reactions   Januvia [Sitagliptin] Other (See Comments)    Pancreatitis    Gabapentin Other (See Comments)    Fatigue and cramps    Vicodin [Hydrocodone-Acetaminophen ] Other (See Comments)    Nightmares    Outpatient Encounter Medications as of 04/27/2024  Medication Sig   ALPRAZolam (XANAX) 0.25 MG tablet Take 1 tablet (0.25 mg total) by mouth at bedtime as needed for up to 14 days for anxiety.    nystatin-triamcinolone ointment (MYCOLOG) Apply 1 Application topically 2 (two) times daily for 10 days.   [DISCONTINUED] famotidine (PEPCID) 20 MG tablet Take 1 tablet (20 mg total) by mouth 2 (two) times daily.   aspirin  (ASPIRIN  CHILDRENS) 81 MG chewable tablet Chew 1 tablet (81 mg total) by mouth 2 (two) times daily with a meal.   Blood Glucose Monitoring Suppl (ONE TOUCH ULTRA 2) w/Device KIT Use as directed   dapagliflozin  propanediol (FARXIGA ) 10 MG TABS tablet Take 1 tablet (10 mg total) by mouth daily.   Evolocumab  (REPATHA  SURECLICK) 140 MG/ML SOAJ Inject 140 mg into the skin every 14 (fourteen) days.   ezetimibe  (ZETIA ) 10 MG tablet Take 1 tablet (10 mg total) by mouth daily.   famotidine (PEPCID) 20 MG tablet Take 1 tablet (20 mg total) by mouth at bedtime.   glimepiride  (AMARYL ) 4 MG tablet Take 4 mg by mouth 2 (two) times daily.   glucose blood (ONETOUCH ULTRA) test strip TEST UP TO FOUR TIMES DAILY.   guaiFENesin (ROBITUSSIN) 100 MG/5ML liquid Take 5 mLs by mouth every 6 (six) hours as needed for cough or to loosen phlegm.  Give 10 ml by mouth every 6 hours as needed for Cough   insulin  glargine, 1 Unit Dial , (TOUJEO  SOLOSTAR) 300 UNIT/ML Solostar Pen Inject 21 Units into the skin daily.   Insulin  Pen Needle (INSUPEN PEN NEEDLES) 32G X 4 MM MISC Use to check blood sugar once daily   Lancets (ONETOUCH DELICA PLUS LANCET30G) MISC Use upto 2 (two) times daily to check blood sugar   Magnesium Hydroxide (MILK OF MAGNESIA PO) Take by mouth as needed.  Give 45 cc by mouth every 24 hours as needed for Constipation 45 cc po daily prn except with renal failure   meloxicam  (MOBIC ) 15 MG tablet Take 1 tablet (15 mg total) by mouth daily with food for inflammation   metFORMIN  (GLUCOPHAGE -XR) 500 MG 24 hr tablet Take 2 tablets (1,000 mg total) by mouth 2 (two) times daily at 8 am and 10 pm.   methocarbamol  (ROBAXIN ) 500 MG tablet Take 1-2 tablets (500-1,000 mg total) by mouth every 6 (six) to 8  (hours) as needed for muscle spasms   nitroGLYCERIN (NITRODUR - DOSED IN MG/24 HR) 0.4 mg/hr patch Place 0.4 mg onto the skin daily. Give 1 tablet sublingually every 5 minutes as needed for Chest Pain x 3 doses. If no relief, call MD   polyethylene glycol (MIRALAX / GLYCOLAX) 17 g packet Take 17 g by mouth daily.  Give 1 scoop by mouth one time a day for constipation   saccharomyces boulardii (FLORASTOR) 250 MG capsule Take 250 mg by mouth daily. (Patient not taking: Reported on 04/24/2024)   traMADol  HCl 25 MG TABS Take 50 mg by mouth every 6 (six) hours as needed.   valACYclovir  (VALTREX ) 500 MG tablet Take 1 tablet (500 mg total) by mouth daily as needed for up to 5 days.   [DISCONTINUED] lisinopril (PRINIVIL,ZESTRIL) 5 MG  tablet Take 5 mg by mouth every evening.   No facility-administered encounter medications on file as of 04/27/2024.    Review of Systems  Constitutional:  Positive for activity change. Negative for appetite change, chills, diaphoresis, fatigue and fever.  HENT:  Negative for congestion.   Respiratory:  Negative for cough, shortness of breath and wheezing.   Cardiovascular:  Positive for leg swelling. Negative for chest pain.  Gastrointestinal:  Negative for abdominal distention, abdominal pain, constipation, diarrhea, nausea and vomiting.  Genitourinary:  Negative for difficulty urinating, dysuria and urgency.  Musculoskeletal:  Positive for gait problem. Negative for back pain, myalgias and neck pain.       Right hip pain  Skin:  Negative for rash.  Neurological:  Positive for dizziness (when standing). Negative for weakness.  Psychiatric/Behavioral:  Positive for agitation (at night), confusion and hallucinations.     Immunization History  Administered Date(s) Administered   Fluad  Quad(high Dose 65+) 04/14/2022   Fluad  Trivalent(High Dose 65+) 04/13/2023   Moderna Covid-19 Vaccine  Bivalent Booster 38yrs & up 05/19/2021   Moderna SARS-COV2 Booster Vaccination  10/23/2020   Pfizer(Comirnaty )Fall Seasonal Vaccine 12 years and older 08/07/2022, 03/23/2023, 04/03/2024   Respiratory Syncytial Virus Vaccine ,Recomb Aduvanted(Arexvy ) 06/19/2022   Pertinent  Health Maintenance Due  Topic Date Due   FOOT EXAM  Never done   OPHTHALMOLOGY EXAM  Never done   Influenza Vaccine  02/11/2024   HEMOGLOBIN A1C  10/18/2024      05/04/2018    9:58 AM  Fall Risk  (RETIRED) Patient Fall Risk Level Moderate fall risk      Data saved with a previous flowsheet row definition   Functional Status Survey:    Vitals:   04/27/24 1636  BP: 120/66  Pulse: 83  Resp: 16  Temp: 98.8 F (37.1 C)  SpO2: 98%   There is no height or weight on file to calculate BMI. Physical Exam Vitals and nursing note reviewed.  Constitutional:      Appearance: Normal appearance.  Cardiovascular:     Rate and Rhythm: Normal rate and regular rhythm.  Pulmonary:     Effort: Pulmonary effort is normal.     Breath sounds: Normal breath sounds.  Musculoskeletal:     Right lower leg: Edema (+1, no calf tenderness or warmth) present.     Left lower leg: No edema.  Skin:    Findings: Bruising (right flank, hip area.) present.     Comments: Dry scaly erythematous rash to the buttocks with small open area Erythematous maculopapular rash to the groin.   Neurological:     General: No focal deficit present.     Mental Status: He is alert. Mental status is at baseline.  Psychiatric:        Mood and Affect: Mood normal.     Labs reviewed: Recent Labs    04/18/24 2037 04/20/24 0659  NA 140 140  K 4.0 4.1  CL 105 106  CO2 24 27  GLUCOSE 224* 157*  BUN 11 11  CREATININE 0.90 0.79  CALCIUM 8.5* 8.0*   No results for input(s): AST, ALT, ALKPHOS, BILITOT, PROT, ALBUMIN in the last 8760 hours. Recent Labs    04/19/24 1100 04/20/24 0659 04/21/24 0922  WBC 7.5 8.0 8.6  NEUTROABS 5.7 6.3 7.4  HGB 8.4* 9.0* 9.6*  HCT 28.4* 30.0* 30.6*  MCV 85.3 86.2 84.1  PLT  116* 98* 114*   No results found for: TSH Lab Results  Component Value Date   HGBA1C  9.0 (H) 04/19/2024   Lab Results  Component Value Date   TRIG 131 08/11/2015    Significant Diagnostic Results in last 30 days:  DG FEMUR, MIN 2 VIEWS RIGHT Result Date: 04/19/2024 CLINICAL DATA:  Closed comminuted inter trochanteric fracture of the proximal right femur. Postop right IM nail. EXAM: RIGHT FEMUR 2 VIEWS COMPARISON:  04/18/2024 FINDINGS: Inter trochanteric and subtrochanteric fractures again demonstrated in the proximal right femur. Interval reduction of the fractures with near anatomic alignment demonstrated. There is residual displacement of lesser trochanteric fragments. Interval placement of internal fixation using an intramedullary rod with compression bold and distal locking screw. Skin clips and soft tissue gas are consistent with recent surgery. Degenerative changes in the right hip. IMPRESSION: Postoperative changes with internal fixation of comminuted fractures of the inter trochanteric right proximal femur. Near anatomic alignment is demonstrated. Electronically Signed   By: Elsie Gravely M.D.   On: 04/19/2024 20:37   DG HIP UNILAT WITH PELVIS 2-3 VIEWS RIGHT Result Date: 04/19/2024 CLINICAL DATA:  Elective surgery.  Intraoperative right IM nail. EXAM: DG HIP (WITH OR WITHOUT PELVIS) 2-3V RIGHT COMPARISON:  04/18/2024 FINDINGS: Intraoperative fluoroscopy is obtained for surgical control purposes. Fluoroscopy time is recorded at 1 minute 22 seconds. Dose 7.9955 mGy. 8 spot fluoroscopic images are obtained. Spot fluoroscopic images obtained initially demonstrate a comminuted inter trochanteric and subtrochanteric fracture of the proximal right femur. Degenerative changes in the right hip. Subsequent images demonstrate reduction and internal fixation of the fractures using a compression bold with long-stem femoral intramedullary rod. A distal locking screw is placed. Degenerative changes  are noted in the right knee. IMPRESSION: Intraoperative fluoroscopy is utilized for surgical control purposes demonstrating internal fixation of inter trochanteric and subtrochanteric fractures of the right proximal femur. Electronically Signed   By: Elsie Gravely M.D.   On: 04/19/2024 19:27   DG C-Arm 1-60 Min-No Report Result Date: 04/19/2024 Fluoroscopy was utilized by the requesting physician.  No radiographic interpretation.   DG C-Arm 1-60 Min-No Report Result Date: 04/19/2024 Fluoroscopy was utilized by the requesting physician.  No radiographic interpretation.   DG Knee Right Port Result Date: 04/19/2024 CLINICAL DATA:  Right hip fracture. EXAM: PORTABLE RIGHT KNEE - 1-2 VIEW COMPARISON:  04/18/2024. FINDINGS: No acute osseous or joint abnormality. Degenerative changes in the knee. IMPRESSION: No acute findings. Electronically Signed   By: Newell Eke M.D.   On: 04/19/2024 09:14   CT Hip Right Wo Contrast Result Date: 04/18/2024 EXAM: CT OF THE RIGHT HIP WITHOUT IV CONTRAST 04/18/2024 10: 58:43 PM TECHNIQUE: CT of the right hip was performed without the administration of intravenous contrast. Multiplanar reformatted images are provided for review. Automated exposure control, iterative reconstruction, and/or weight based adjustment of the mA/kV was utilized to reduce the radiation dose to as low as reasonably achievable. COMPARISON: None available. CLINICAL HISTORY: Right hip fracture, operative planning. Per chart: Patient brought in from home, had a trip and fall while walking his dog, falling onto his right side. Presents with right hip pain and radiating pain down the leg. FINDINGS: BONES: There is an acute, comminuted, impacted fracture of the intertrochanteric/subtrochanteric region of the right hip with avulsion of the lesser trochanter. The dominant fracture planes extend obliquely within the intertrochanteric region, extending into the posterior cortex of the proximal femoral  diaphysis, best seen on sagittal image 45, series 9. There is impaction at the intertrochanteric fracture plane with moderate varus angulation. SOFT TISSUE: Mild vascular calcifications are noted. JOINT: The femoral head  is still seated within the right acetabulum with moderate superimposed degenerative arthritis of the right hip. INTRAPELVIC CONTENTS: Incidental note of distal colonic diverticulosis. IMPRESSION: 1. Acute, comminuted, impacted fracture of the intertrochanteric/subtrochanteric region of the right hip with avulsion of the lesser trochanter and moderate varus angulation. 2. Moderate degenerative arthritis of the right hip. Electronically signed by: Dorethia Molt MD 04/18/2024 11:06 PM EDT RP Workstation: [id number]   DG Lumbar Spine 2-3 Views Result Date: 04/18/2024 CLINICAL DATA:  [id number] Fall [id number] EXAM: LUMBAR SPINE - 2-3 VIEW COMPARISON:  X-ray lumbar spine 05/04/2018 FINDINGS: There is no evidence of lumbar spine fracture. Multilevel moderate degenerative change of the spine. Alignment is normal. Intervertebral disc spaces are maintained. Atherosclerotic plaque. IMPRESSION: No acute displaced fracture or traumatic listhesis of the lumbar spine. Electronically Signed   By: Morgane  Naveau M.D.   On: 04/18/2024 20:41   DG Chest Port 1 View Result Date: 04/18/2024 CLINICAL DATA:  fall, pain Pt fell while walking his dog this evening, pt fell on right hip. Pt c/o right hip pain radiating down leg. EXAM: PORTABLE CHEST 1 VIEW COMPARISON:  Chest x-ray 08/19/2022. FINDINGS: The heart and mediastinal contours are unchanged. Atherosclerotic plaque. No focal consolidation. No pulmonary edema. No pleural effusion. No pneumothorax. No acute osseous abnormality. IMPRESSION: 1. No active disease. 2.  Aortic Atherosclerosis (ICD10-I70.0). Electronically Signed   By: Morgane  Naveau M.D.   On: 04/18/2024 20:38   DG Hip Unilat With Pelvis 2-3 Views Right Result Date: 04/18/2024 CLINICAL DATA:  fall, pain  EXAM: DG HIP (WITH OR WITHOUT PELVIS) 2-3V RIGHT COMPARISON:  CT abdomen pelvis 01/06/2013 FINDINGS: Acute displaced right intratrochanteric femoral fracture extending to the proximal femoral shaft. No right hip dislocation. No acute displaced fracture or dislocation of the left hip on frontal view. Question avascular necrosis of the left femoral head. There is no evidence of severe arthropathy or other focal bone abnormality. IMPRESSION: Acute displaced right intratrochanteric femoral fracture extending to the proximal femoral shaft. Electronically Signed   By: Morgane  Naveau M.D.   On: 04/18/2024 20:36    Assessment/Plan 1. Fungal infection of the groin (Primary)  - nystatin-triamcinolone ointment (MYCOLOG); Apply 1 Application topically 2 (two) times daily for 10 days.  2. Acute delirium Post surgical, s/p hospitalization Occasional delusions, anxiety and restlessness at night. Improved from admission to skilled rehab.  - ALPRAZolam (XANAX) 0.25 MG tablet; Take 1 tablet (0.25 mg total) by mouth at bedtime as needed for up to 14 days for anxiety.  Dispense: 14 tablet; Refill: 0  3. Orthostatic hypotension Encourage fluids Rise slowly D/c lisinopril and monitor bp Labs ordered   4. Right intertrochanteric hip fracture S/p repair 50% WB F/u with ortho as indicted.  Compression hose ordered IS ordered.  Using tramadol , tylenol , and robaxin  for pain Also on mobic  and asa Add pepcid for GI protection Trying not to use oxycodone  due to issues of delirium  Family/ staff Communication: nurse and resident wife at bedside.   Labs/tests ordered:  CBC BMP next week

## 2024-04-29 ENCOUNTER — Other Ambulatory Visit (HOSPITAL_BASED_OUTPATIENT_CLINIC_OR_DEPARTMENT_OTHER): Payer: Self-pay

## 2024-05-01 ENCOUNTER — Other Ambulatory Visit: Payer: Self-pay

## 2024-05-04 ENCOUNTER — Other Ambulatory Visit (HOSPITAL_BASED_OUTPATIENT_CLINIC_OR_DEPARTMENT_OTHER): Payer: Self-pay

## 2024-05-04 ENCOUNTER — Other Ambulatory Visit: Payer: Self-pay

## 2024-05-05 ENCOUNTER — Other Ambulatory Visit: Payer: Self-pay

## 2024-05-05 ENCOUNTER — Other Ambulatory Visit (HOSPITAL_BASED_OUTPATIENT_CLINIC_OR_DEPARTMENT_OTHER): Payer: Self-pay

## 2024-05-08 ENCOUNTER — Other Ambulatory Visit: Payer: Self-pay | Admitting: Adult Health

## 2024-05-08 DIAGNOSIS — R410 Disorientation, unspecified: Secondary | ICD-10-CM

## 2024-05-08 MED ORDER — ALPRAZOLAM 0.25 MG PO TABS: 0.2500 mg | ORAL_TABLET | Freq: Every evening | ORAL | 1 refills | Status: DC | PRN

## 2024-05-15 ENCOUNTER — Encounter: Payer: Self-pay | Admitting: Adult Health

## 2024-05-15 ENCOUNTER — Non-Acute Institutional Stay (SKILLED_NURSING_FACILITY): Admitting: Adult Health

## 2024-05-15 DIAGNOSIS — S72144D Nondisplaced intertrochanteric fracture of right femur, subsequent encounter for closed fracture with routine healing: Secondary | ICD-10-CM

## 2024-05-15 NOTE — Progress Notes (Signed)
 Location:  Medical Illustrator of Service:  SNF (31) Provider:   Bari America, ANP [facility] [phone]  Location:  [facility] Room Number: 151 P Place of Service:  SNF ([phone]) Provider:  Tawni America, NP    Patient Care Team: Charlott Dorn LABOR, MD as PCP - General (Internal Medicine)  Extended Emergency Contact Information Primary Emergency Contact: Stoermer,Sandy Address: [address]          [place], [place] [address] [place] Mobile Phone: [phone] Relation: Spouse Secondary Emergency Contact: Leiphart,Rob  [place] Home Phone: [phone] Mobile Phone: [phone] Relation: Son  Code Status:  Full Code Goals of care: Advanced Directive information    04/18/2024    7:25 PM  Advanced Directives  Does Patient Have a Medical Advance Directive? Yes  Type of Estate Agent of [place];Living will  Does patient want to make changes to medical advance directive? No - Patient declined  Copy of Healthcare Power of Attorney in Chart? No - copy requested  Would patient like information on creating a medical advance directive? No - Patient declined     Chief Complaint  Patient presents with   Hip Pain    HPI:  Pt is a 83 y.o. male seen today for an acute visit for right hip pain and right knee pain  He currently resides in skilled rehab after a mechanical fall with intertrochanteric right femur fracture s/p intramedullary fixation 04/19/24   He reports that over the weekend he began to notice an increase in right hip and knee pain and as of this morning he is afraid to walk on his right hip. He has not had fall. NO increase in swelling or concerns about his incision.  Current recommendations are for 50% WB on the right side and he does report using the foot board to push himself up in bed. His next ortho apt is in two weeks.  Over the  weekend he had increase in activity per his wife.   Past Medical History:  Diagnosis Date   BPH (benign prostatic hyperplasia)    Cancer (HCC)    melanoma left arm and back removed   Diabetes mellitus without complication (HCC)    Type II   Diverticulitis    Pancreatitis    many years ago   Renal stone    Past Surgical History:  Procedure Laterality Date   INTRAMEDULLARY (IM) NAIL INTERTROCHANTERIC Right 04/19/2024   Procedure: FIXATION, FRACTURE, INTERTROCHANTERIC, WITH INTRAMEDULLARY ROD;  Surgeon: Fidel Rogue, MD;  Location: [facility];  Service: Orthopedics;  Laterality: Right;   LUMBAR LAMINECTOMY/DECOMPRESSION MICRODISCECTOMY Left 05/04/2018   Procedure: LEFT-SIDED LUMBAR 4-5 MICRODISECTOMY;  Surgeon: Beuford Anes, MD;  Location: [facility] OR;  Service: Orthopedics;  Laterality: Left;   PROSTATECTOMY N/A 01/21/2015   Procedure: PROSTATECTOMY RETROPUBIC;  Surgeon: Arlena Gal, MD;  Location: [facility];  Service: Urology;  Laterality: N/A;   right leg broken (tibia and fibula) 1961     SHOULDER ARTHROSCOPY WITH OPEN ROTATOR CUFF REPAIR  2002   right   TONSILLECTOMY     WISDOM TOOTH EXTRACTION      Allergies  Allergen Reactions   Januvia [Sitagliptin] Other (See Comments)    Pancreatitis    Gabapentin Other (See Comments)    Fatigue and cramps    Vicodin [Hydrocodone-Acetaminophen ] Other (See Comments)    Nightmares    Outpatient Encounter Medications as of 05/15/2024  Medication Sig  acetaminophen  (TYLENOL ) 500 MG tablet Take 1,000 mg by mouth every 8 (eight) hours as needed.   ALPRAZolam (XANAX) 0.25 MG tablet Take 1 tablet (0.25 mg total) by mouth at bedtime as needed for anxiety.   aspirin  (ASPIRIN  CHILDRENS) 81 MG chewable tablet Chew 1 tablet (81 mg total) by mouth 2 (two) times daily with a meal.   Blood Glucose Monitoring Suppl (ONE TOUCH ULTRA 2) w/Device KIT Use as directed   CALCIUM CARBONATE-VIT D-MIN PO Take by mouth as directed. Give 1 tablet by mouth two  times a day for replacement   dapagliflozin  propanediol (FARXIGA ) 10 MG TABS tablet Take 1 tablet (10 mg total) by mouth daily.   Evolocumab  (REPATHA  SURECLICK) 140 MG/ML SOAJ Inject 140 mg into the skin every 14 (fourteen) days.   ezetimibe  (ZETIA ) 10 MG tablet Take 1 tablet (10 mg total) by mouth daily.   famotidine (PEPCID) 20 MG tablet Take 1 tablet (20 mg total) by mouth at bedtime.   glimepiride  (AMARYL ) 4 MG tablet Take 4 mg by mouth 2 (two) times daily.   glucose blood (ONETOUCH ULTRA) test strip TEST UP TO FOUR TIMES DAILY.   guaiFENesin (ROBITUSSIN) 100 MG/5ML liquid Take 10 mLs by mouth every 6 (six) hours as needed for cough or to loosen phlegm.  Give 10 ml by mouth every 6 hours as needed for Cough   insulin  aspart (NOVOLOG  FLEXPEN) 100 UNIT/ML FlexPen Inject into the skin as directed. Inject as per sliding scale: if 201 - 250 = 2 units; 251 - 300 = 4 units; 301 - 350 = 6 units ; 351 - 400 = 8 units , subcutaneously before meals and at bedtime related to TYPE 2 DIABETES MELLITUS WITHOUT COMPLICATIONS (E11.9   insulin  glargine, 1 Unit Dial , (TOUJEO  SOLOSTAR) 300 UNIT/ML Solostar Pen Inject 21 Units into the skin daily.   Insulin  Pen Needle (INSUPEN PEN NEEDLES) 32G X 4 MM MISC Use to check blood sugar once daily   Lancets (ONETOUCH DELICA PLUS LANCET30G) MISC Use upto 2 (two) times daily to check blood sugar   Magnesium Hydroxide (MILK OF MAGNESIA PO) Take by mouth as needed.  Give 45 cc by mouth every 24 hours as needed for Constipation 45 cc po daily prn except with renal failure   meloxicam  (MOBIC ) 15 MG tablet Take 1 tablet (15 mg total) by mouth daily with food for inflammation   menthol (CEPACOL) 3 MG lozenge Take 1 lozenge by mouth every 2 (two) hours as needed for sore throat. Give 1 lozenge by mouth every 2 hours as needed for Sore throat/cough Dissolve in mouth slowly.Do not exceed more than 6 in 24 hours. Notify Provider if not better in 2 days. If res is diabetic use  sugar free lozenges   metFORMIN  (GLUCOPHAGE -XR) 500 MG 24 hr tablet Take 2 tablets (1,000 mg total) by mouth 2 (two) times daily at 8 am and 10 pm.   nitroGLYCERIN (NITRODUR - DOSED IN MG/24 HR) 0.4 mg/hr patch Place 0.4 mg onto the skin daily. Give 1 tablet sublingually every 5 minutes as needed for Chest Pain x 3 doses. If no relief, call MD   polyethylene glycol (MIRALAX / GLYCOLAX) 17 g packet Take 17 g by mouth daily.  Give 1 scoop by mouth one time a day for constipation   promethazine (PHENERGAN) 12.5 MG suppository Place 12.5 mg rectally every 6 (six) hours as needed for nausea or vomiting.   saccharomyces boulardii (FLORASTOR) 250 MG capsule Take 250 mg by  mouth daily.   traMADol  HCl 25 MG TABS Take 50 mg by mouth every 6 (six) hours as needed.   methocarbamol  (ROBAXIN ) 500 MG tablet Take 1-2 tablets (500-1,000 mg total) by mouth every 6 (six) to 8 (hours) as needed for muscle spasms (Patient not taking: Reported on 05/15/2024)   valACYclovir  (VALTREX ) 500 MG tablet Take 1 tablet (500 mg total) by mouth daily as needed for up to 5 days. (Patient not taking: Reported on 05/15/2024)   No facility-administered encounter medications on file as of 05/15/2024.    Review of Systems  Constitutional:  Positive for activity change. Negative for appetite change, chills, diaphoresis, fatigue, fever and unexpected weight change.  Musculoskeletal:  Positive for arthralgias and gait problem. Negative for back pain.       Right hip pain and right knee pain    Immunization History  Administered Date(s) Administered   Fluad  Quad(high Dose 65+) 04/14/2022   Fluad  Trivalent(High Dose 65+) 04/13/2023   Moderna Covid-19 Vaccine  Bivalent Booster 77yrs & up 05/19/2021   Moderna SARS-COV2 Booster Vaccination 10/23/2020   Pfizer(Comirnaty )Fall Seasonal Vaccine 12 years and older 08/07/2022, 03/23/2023, 04/03/2024   Respiratory Syncytial Virus Vaccine ,Recomb Aduvanted(Arexvy ) 06/19/2022   Pertinent  Health  Maintenance Due  Topic Date Due   FOOT EXAM  Never done   OPHTHALMOLOGY EXAM  Never done   Influenza Vaccine  02/11/2024   HEMOGLOBIN A1C  10/18/2024      05/04/2018    9:58 AM  Fall Risk  (RETIRED) Patient Fall Risk Level Moderate fall risk      Data saved with a previous flowsheet row definition   Functional Status Survey:    Vitals:   05/15/24 1035  BP: 137/72  Pulse: 88  Resp: 14  Temp: 98.6 F (37 C)  SpO2: 96%  Weight: 165 lb 6.4 oz (75 kg)  Height: 6' 2 (1.88 m)   Body mass index is 21.24 kg/m. Physical Exam Vitals reviewed.  Constitutional:      Appearance: Normal appearance.  HENT:     Head: Normocephalic and atraumatic.  Musculoskeletal:        General: Swelling (mild at right hip area no warmth or redness, no deformity) present. No tenderness, deformity or signs of injury.     Right lower leg: No edema.     Left lower leg: No edema.  Skin:    General: Skin is warm and dry.     Comments: Right hip and right knee incision well healed.  Right knee with mild resolving ecchymosis, no swelling or redness or deformity.   Neurological:     Mental Status: He is alert. Mental status is at baseline.     Labs reviewed: Recent Labs    04/18/24 2037 04/20/24 0659 04/27/24 0000  NA 140 140 136*  K 4.0 4.1 4.2  CL 105 106 102  CO2 24 27 24*  GLUCOSE 224* 157*  --   BUN 11 11 22*  CREATININE 0.90 0.79 0.7  CALCIUM 8.5* 8.0* 8.0*   No results for input(s): AST, ALT, ALKPHOS, BILITOT, PROT, ALBUMIN in the last 8760 hours. Recent Labs    04/19/24 1100 04/20/24 0659 04/21/24 0922 04/27/24 0000  WBC 7.5 8.0 8.6 10.2  NEUTROABS 5.7 6.3 7.4  --   HGB 8.4* 9.0* 9.6* 9.3*  HCT 28.4* 30.0* 30.6* 28*  MCV 85.3 86.2 84.1  --   PLT 116* 98* 114* 307   No results found for: TSH Lab Results  Component Value Date  HGBA1C 9.0 (H) 04/19/2024   Lab Results  Component Value Date   TRIG 131 08/11/2015    Significant Diagnostic Results in  last 30 days:  DG FEMUR, MIN 2 VIEWS RIGHT Result Date: 04/19/2024 CLINICAL DATA:  Closed comminuted inter trochanteric fracture of the proximal right femur. Postop right IM nail. EXAM: RIGHT FEMUR 2 VIEWS COMPARISON:  04/18/2024 FINDINGS: Inter trochanteric and subtrochanteric fractures again demonstrated in the proximal right femur. Interval reduction of the fractures with near anatomic alignment demonstrated. There is residual displacement of lesser trochanteric fragments. Interval placement of internal fixation using an intramedullary rod with compression bold and distal locking screw. Skin clips and soft tissue gas are consistent with recent surgery. Degenerative changes in the right hip. IMPRESSION: Postoperative changes with internal fixation of comminuted fractures of the inter trochanteric right proximal femur. Near anatomic alignment is demonstrated. Electronically Signed   By: Elsie Gravely M.D.   On: 04/19/2024 20:37   DG HIP UNILAT WITH PELVIS 2-3 VIEWS RIGHT Result Date: 04/19/2024 CLINICAL DATA:  Elective surgery.  Intraoperative right IM nail. EXAM: DG HIP (WITH OR WITHOUT PELVIS) 2-3V RIGHT COMPARISON:  04/18/2024 FINDINGS: Intraoperative fluoroscopy is obtained for surgical control purposes. Fluoroscopy time is recorded at 1 minute 22 seconds. Dose 7.9955 mGy. 8 spot fluoroscopic images are obtained. Spot fluoroscopic images obtained initially demonstrate a comminuted inter trochanteric and subtrochanteric fracture of the proximal right femur. Degenerative changes in the right hip. Subsequent images demonstrate reduction and internal fixation of the fractures using a compression bold with long-stem femoral intramedullary rod. A distal locking screw is placed. Degenerative changes are noted in the right knee. IMPRESSION: Intraoperative fluoroscopy is utilized for surgical control purposes demonstrating internal fixation of inter trochanteric and subtrochanteric fractures of the right  proximal femur. Electronically Signed   By: Elsie Gravely M.D.   On: 04/19/2024 19:27   DG C-Arm 1-60 Min-No Report Result Date: 04/19/2024 Fluoroscopy was utilized by the requesting physician.  No radiographic interpretation.   DG C-Arm 1-60 Min-No Report Result Date: 04/19/2024 Fluoroscopy was utilized by the requesting physician.  No radiographic interpretation.   DG Knee Right Port Result Date: 04/19/2024 CLINICAL DATA:  Right hip fracture. EXAM: PORTABLE RIGHT KNEE - 1-2 VIEW COMPARISON:  04/18/2024. FINDINGS: No acute osseous or joint abnormality. Degenerative changes in the knee. IMPRESSION: No acute findings. Electronically Signed   By: Newell Eke M.D.   On: 04/19/2024 09:14   CT Hip Right Wo Contrast Result Date: 04/18/2024 EXAM: CT OF THE RIGHT HIP WITHOUT IV CONTRAST 04/18/2024 10: 58:43 PM TECHNIQUE: CT of the right hip was performed without the administration of intravenous contrast. Multiplanar reformatted images are provided for review. Automated exposure control, iterative reconstruction, and/or weight based adjustment of the mA/kV was utilized to reduce the radiation dose to as low as reasonably achievable. COMPARISON: None available. CLINICAL HISTORY: Right hip fracture, operative planning. Per chart: Patient brought in from home, had a trip and fall while walking his dog, falling onto his right side. Presents with right hip pain and radiating pain down the leg. FINDINGS: BONES: There is an acute, comminuted, impacted fracture of the intertrochanteric/subtrochanteric region of the right hip with avulsion of the lesser trochanter. The dominant fracture planes extend obliquely within the intertrochanteric region, extending into the posterior cortex of the proximal femoral diaphysis, best seen on sagittal image 45, series 9. There is impaction at the intertrochanteric fracture plane with moderate varus angulation. SOFT TISSUE: Mild vascular calcifications are noted. JOINT: The  femoral head is still seated within the right acetabulum with moderate superimposed degenerative arthritis of the right hip. INTRAPELVIC CONTENTS: Incidental note of distal colonic diverticulosis. IMPRESSION: 1. Acute, comminuted, impacted fracture of the intertrochanteric/subtrochanteric region of the right hip with avulsion of the lesser trochanter and moderate varus angulation. 2. Moderate degenerative arthritis of the right hip. Electronically signed by: Dorethia Molt MD 04/18/2024 11:06 PM EDT RP Workstation: [id number]   DG Lumbar Spine 2-3 Views Result Date: 04/18/2024 CLINICAL DATA:  [id number] Fall [id number] EXAM: LUMBAR SPINE - 2-3 VIEW COMPARISON:  X-ray lumbar spine 05/04/2018 FINDINGS: There is no evidence of lumbar spine fracture. Multilevel moderate degenerative change of the spine. Alignment is normal. Intervertebral disc spaces are maintained. Atherosclerotic plaque. IMPRESSION: No acute displaced fracture or traumatic listhesis of the lumbar spine. Electronically Signed   By: Morgane  Naveau M.D.   On: 04/18/2024 20:41   DG Chest Port 1 View Result Date: 04/18/2024 CLINICAL DATA:  fall, pain Pt fell while walking his dog this evening, pt fell on right hip. Pt c/o right hip pain radiating down leg. EXAM: PORTABLE CHEST 1 VIEW COMPARISON:  Chest x-ray 08/19/2022. FINDINGS: The heart and mediastinal contours are unchanged. Atherosclerotic plaque. No focal consolidation. No pulmonary edema. No pleural effusion. No pneumothorax. No acute osseous abnormality. IMPRESSION: 1. No active disease. 2.  Aortic Atherosclerosis (ICD10-I70.0). Electronically Signed   By: Morgane  Naveau M.D.   On: 04/18/2024 20:38   DG Hip Unilat With Pelvis 2-3 Views Right Result Date: 04/18/2024 CLINICAL DATA:  fall, pain EXAM: DG HIP (WITH OR WITHOUT PELVIS) 2-3V RIGHT COMPARISON:  CT abdomen pelvis 01/06/2013 FINDINGS: Acute displaced right intratrochanteric femoral fracture extending to the proximal femoral shaft. No right  hip dislocation. No acute displaced fracture or dislocation of the left hip on frontal view. Question avascular necrosis of the left femoral head. There is no evidence of severe arthropathy or other focal bone abnormality. IMPRESSION: Acute displaced right intratrochanteric femoral fracture extending to the proximal femoral shaft. Electronically Signed   By: Morgane  Naveau M.D.   On: 04/18/2024 20:36    Assessment/Plan  1. Closed nondisplaced intertrochanteric fracture of right femur with routine healing, subsequent encounter (Primary)  Increased pain After using foot board to push and increased activity over the weekend No significant findings on exam Will check xray, communicate with ortho if necessary

## 2024-05-16 ENCOUNTER — Other Ambulatory Visit (HOSPITAL_BASED_OUTPATIENT_CLINIC_OR_DEPARTMENT_OTHER): Payer: Self-pay

## 2024-05-20 ENCOUNTER — Other Ambulatory Visit (HOSPITAL_BASED_OUTPATIENT_CLINIC_OR_DEPARTMENT_OTHER): Payer: Self-pay

## 2024-05-22 ENCOUNTER — Other Ambulatory Visit (HOSPITAL_BASED_OUTPATIENT_CLINIC_OR_DEPARTMENT_OTHER): Payer: Self-pay

## 2024-05-22 MED ORDER — REPATHA SURECLICK 140 MG/ML ~~LOC~~ SOAJ
140.0000 mg | SUBCUTANEOUS | 3 refills | Status: AC
  Filled 2024-05-22 – 2024-08-17 (×2): qty 2, 28d supply, fill #0

## 2024-05-23 ENCOUNTER — Other Ambulatory Visit (HOSPITAL_COMMUNITY): Payer: Self-pay

## 2024-05-25 ENCOUNTER — Other Ambulatory Visit: Payer: Self-pay

## 2024-05-26 ENCOUNTER — Encounter: Payer: Self-pay | Admitting: Adult Health

## 2024-05-26 ENCOUNTER — Non-Acute Institutional Stay (SKILLED_NURSING_FACILITY): Payer: Self-pay | Admitting: Adult Health

## 2024-05-26 ENCOUNTER — Other Ambulatory Visit: Payer: Self-pay

## 2024-05-26 DIAGNOSIS — Z794 Long term (current) use of insulin: Secondary | ICD-10-CM

## 2024-05-26 DIAGNOSIS — M79671 Pain in right foot: Secondary | ICD-10-CM | POA: Diagnosis not present

## 2024-05-26 DIAGNOSIS — Z9889 Other specified postprocedural states: Secondary | ICD-10-CM

## 2024-05-26 DIAGNOSIS — R410 Disorientation, unspecified: Secondary | ICD-10-CM | POA: Diagnosis not present

## 2024-05-26 DIAGNOSIS — E1165 Type 2 diabetes mellitus with hyperglycemia: Secondary | ICD-10-CM | POA: Diagnosis not present

## 2024-05-26 DIAGNOSIS — Z8781 Personal history of (healed) traumatic fracture: Secondary | ICD-10-CM

## 2024-05-26 MED ORDER — DICLOFENAC SODIUM 1 % EX GEL: 2.0000 g | Freq: Three times a day (TID) | CUTANEOUS | Status: AC | PRN

## 2024-05-26 MED ORDER — MELOXICAM 15 MG PO TABS: 7.5000 mg | ORAL_TABLET | Freq: Every day | ORAL | Status: AC

## 2024-05-26 MED ORDER — ALPRAZOLAM 0.25 MG PO TABS: 0.2500 mg | ORAL_TABLET | Freq: Every evening | ORAL | 1 refills | Status: AC | PRN

## 2024-05-26 NOTE — Progress Notes (Signed)
 Location:  [facility] Room Number: 151 P Place of Service:  SNF ([phone]) Provider: Tawni America, NP   Patient Care Team: Charlott Dorn LABOR, MD as PCP - General (Internal Medicine)  Extended Emergency Contact Information Primary Emergency Contact: Garringer,Sandy Address: [address]          [place], [place] [address] [place] Mobile Phone: [phone] Relation: Spouse Secondary Emergency Contact: Loughridge,Rob  [place] Home Phone: [phone] Mobile Phone: [phone] Relation: Son  Code Status:  Full Code Goals of care: Advanced Directive information    04/18/2024    7:25 PM  Advanced Directives  Does Patient Have a Medical Advance Directive? Yes  Type of Estate Agent of [place];Living will  Does patient want to make changes to medical advance directive? No - Patient declined  Copy of Healthcare Power of Attorney in Chart? No - copy requested  Would patient like information on creating a medical advance directive? No - Patient declined     Chief Complaint  Patient presents with   Foot Pain    Heel pain    HPI:  Pt is a 83 y.o. male seen today for an acute visit for foot pain Pain was present to the heel area Resolved in the morning No current injury  Had a mechanical fall with intertrochanteric right femur fracture s/p intramedullary fixation 04/19/24  Resides in skilled care rehab receiving therapy with a goal to return to [place]   The nurse reports his foot hurts at times on the right. The patient has short term memory loss and can not provide a detailed history. He is not currently having any pain. He did have an area of pressure to the right foot which has resolved. No current wounds to his feet or numbness.  Type 2 DM : A1C 9 04/19/24 on Toujeo , metformin , amaryl  Has been on sliding scale here which is new. Using it sparingly. CBG this am was 66. He took 2 units the  night before.  Overall CBGS are ranging 88-300 and the 66 was more of an outlier. CBG 68 on labs on 10/16 BUN 22 Cr 0.72 04/27/24  Remains on asa bid for DVT prevention Now more mobile  Stil 50% weight bearing  Using xanax prn for anxety and confusion at night, along with sleep issues while in rehab Also on melatonin which is helping.   On mobic  15 mg using tylenol  and tramadol  for hip pain.      Past Medical History:  Diagnosis Date   BPH (benign prostatic hyperplasia)    Cancer (HCC)    melanoma left arm and back removed   Diabetes mellitus without complication (HCC)    Type II   Diverticulitis    Pancreatitis    many years ago   Renal stone    Past Surgical History:  Procedure Laterality Date   INTRAMEDULLARY (IM) NAIL INTERTROCHANTERIC Right 04/19/2024   Procedure: FIXATION, FRACTURE, INTERTROCHANTERIC, WITH INTRAMEDULLARY ROD;  Surgeon: Fidel Rogue, MD;  Location: [facility];  Service: Orthopedics;  Laterality: Right;   LUMBAR LAMINECTOMY/DECOMPRESSION MICRODISCECTOMY Left 05/04/2018   Procedure: LEFT-SIDED LUMBAR 4-5 MICRODISECTOMY;  Surgeon: Beuford Anes, MD;  Location: [facility];  Service: Orthopedics;  Laterality: Left;   PROSTATECTOMY N/A 01/21/2015   Procedure: PROSTATECTOMY RETROPUBIC;  Surgeon: Arlena Gal, MD;  Location: [facility];  Service: Urology;  Laterality: N/A;   right leg broken (tibia and fibula) 1961     SHOULDER ARTHROSCOPY WITH OPEN  ROTATOR CUFF REPAIR  2002   right   TONSILLECTOMY     WISDOM TOOTH EXTRACTION      Allergies  Allergen Reactions   Januvia [Sitagliptin] Other (See Comments)    Pancreatitis    Gabapentin Other (See Comments)    Fatigue and cramps    Vicodin [Hydrocodone-Acetaminophen ] Other (See Comments)    Nightmares    Outpatient Encounter Medications as of 05/26/2024  Medication Sig   acetaminophen  (TYLENOL ) 500 MG tablet Take 1,000 mg by mouth every 8 (eight) hours as needed.   ALPRAZolam (XANAX) 0.25 MG tablet Take  1 tablet (0.25 mg total) by mouth at bedtime as needed for anxiety.   aspirin  (ASPIRIN  CHILDRENS) 81 MG chewable tablet Chew 1 tablet (81 mg total) by mouth 2 (two) times daily with a meal.   Blood Glucose Monitoring Suppl (ONE TOUCH ULTRA 2) w/Device KIT Use as directed   CALCIUM CARBONATE-VIT D-MIN PO Take by mouth as directed. Give 1 tablet by mouth two times a day for replacement   dapagliflozin  propanediol (FARXIGA ) 10 MG TABS tablet Take 1 tablet (10 mg total) by mouth daily.   Evolocumab  (REPATHA  SURECLICK) 140 MG/ML SOAJ Inject 140 mg into the skin every 14 (fourteen) days.   ezetimibe  (ZETIA ) 10 MG tablet Take 1 tablet (10 mg total) by mouth daily.   famotidine (PEPCID) 20 MG tablet Take 1 tablet (20 mg total) by mouth at bedtime.   glimepiride  (AMARYL ) 4 MG tablet Take 4 mg by mouth 2 (two) times daily.   glucose blood (ONETOUCH ULTRA) test strip TEST UP TO FOUR TIMES DAILY.   guaiFENesin (ROBITUSSIN) 100 MG/5ML liquid Take 10 mLs by mouth every 6 (six) hours as needed for cough or to loosen phlegm.  Give 10 ml by mouth every 6 hours as needed for Cough   insulin  aspart (NOVOLOG  FLEXPEN) 100 UNIT/ML FlexPen Inject into the skin as directed. Inject as per sliding scale: if 201 - 250 = 2 units; 251 - 300 = 4 units; 301 - 350 = 6 units ; 351 - 400 = 8 units , subcutaneously before meals and at bedtime related to TYPE 2 DIABETES MELLITUS WITHOUT COMPLICATIONS (E11.9   insulin  glargine, 1 Unit Dial , (TOUJEO  SOLOSTAR) 300 UNIT/ML Solostar Pen Inject 21 Units into the skin daily.   Insulin  Pen Needle (INSUPEN PEN NEEDLES) 32G X 4 MM MISC Use to check blood sugar once daily   Lancets (ONETOUCH DELICA PLUS LANCET30G) MISC Use upto 2 (two) times daily to check blood sugar   Magnesium Hydroxide (MILK OF MAGNESIA PO) Take by mouth as needed.  Give 45 cc by mouth every 24 hours as needed for Constipation 45 cc po daily prn except with renal failure   melatonin 5 MG TABS Take 5 mg by mouth at  bedtime.   meloxicam  (MOBIC ) 15 MG tablet Take 1 tablet (15 mg total) by mouth daily with food for inflammation   menthol (CEPACOL) 3 MG lozenge Take 1 lozenge by mouth every 2 (two) hours as needed for sore throat. Give 1 lozenge by mouth every 2 hours as needed for Sore throat/cough Dissolve in mouth slowly.Do not exceed more than 6 in 24 hours. Notify Provider if not better in 2 days. If res is diabetic use sugar free lozenges   metFORMIN  (GLUCOPHAGE -XR) 500 MG 24 hr tablet Take 2 tablets (1,000 mg total) by mouth 2 (two) times daily at 8 am and 10 pm.   nitroGLYCERIN (NITRODUR - DOSED IN MG/24 HR) 0.4  mg/hr patch Place 0.4 mg onto the skin daily. Give 1 tablet sublingually every 5 minutes as needed for Chest Pain x 3 doses. If no relief, call MD   polyethylene glycol (MIRALAX / GLYCOLAX) 17 g packet Take 17 g by mouth daily.  Give 1 scoop by mouth one time a day for constipation   promethazine (PHENERGAN) 12.5 MG suppository Place 12.5 mg rectally every 6 (six) hours as needed for nausea or vomiting.   saccharomyces boulardii (FLORASTOR) 250 MG capsule Take 250 mg by mouth daily.   traMADol  HCl 25 MG TABS Take 50 mg by mouth every 6 (six) hours as needed.   valACYclovir  (VALTREX ) 500 MG tablet Take 1 tablet (500 mg total) by mouth daily as needed for up to 5 days. (Patient not taking: Reported on 05/26/2024)   No facility-administered encounter medications on file as of 05/26/2024.    Review of Systems  Immunization History  Administered Date(s) Administered   Fluad  Quad(high Dose 65+) 04/14/2022   Fluad  Trivalent(High Dose 65+) 04/13/2023   Moderna Covid-19 Vaccine  Bivalent Booster 72yrs & up 05/19/2021   Moderna SARS-COV2 Booster Vaccination 10/23/2020   Pfizer(Comirnaty )Fall Seasonal Vaccine 12 years and older 08/07/2022, 03/23/2023, 04/03/2024   Respiratory Syncytial Virus Vaccine ,Recomb Aduvanted(Arexvy ) 06/19/2022   Pertinent  Health Maintenance Due  Topic Date Due   FOOT  EXAM  Never done   OPHTHALMOLOGY EXAM  Never done   Influenza Vaccine  02/11/2024   HEMOGLOBIN A1C  10/18/2024      05/04/2018    9:58 AM  Fall Risk  (RETIRED) Patient Fall Risk Level Moderate fall risk      Data saved with a previous flowsheet row definition   Functional Status Survey:    Vitals:   05/26/24 0918  BP: 132/72  Pulse: 86  Resp: 20  Temp: 97.8 F (36.6 C)  SpO2: 97%  Weight: 169 lb (76.7 kg)  Height: 6' 2 (1.88 m)   Body mass index is 21.7 kg/m. Physical Exam Vitals and nursing note reviewed.  Constitutional:      Appearance: Normal appearance.  Cardiovascular:     Pulses:          Dorsalis pedis pulses are 1+ on the right side and 2+ on the left side.  Musculoskeletal:     Right lower leg: No edema.     Left lower leg: No edema.  Feet:     Right foot:     Protective Sensation: 7 sites tested.  6 sites sensed.     Skin integrity: Skin integrity normal.     Left foot:     Protective Sensation: 7 sites tested.  5 sites sensed.     Skin integrity: Skin integrity normal.  Skin:    General: Skin is warm and dry.  Neurological:     Mental Status: He is alert.     Labs reviewed: Recent Labs    04/18/24 2037 04/20/24 0659 04/27/24 0000  NA 140 140 136*  K 4.0 4.1 4.2  CL 105 106 102  CO2 24 27 24*  GLUCOSE 224* 157*  --   BUN 11 11 22*  CREATININE 0.90 0.79 0.7  CALCIUM 8.5* 8.0* 8.0*   No results for input(s): AST, ALT, ALKPHOS, BILITOT, PROT, ALBUMIN in the last 8760 hours. Recent Labs    04/19/24 1100 04/20/24 0659 04/21/24 0922 04/27/24 0000  WBC 7.5 8.0 8.6 10.2  NEUTROABS 5.7 6.3 7.4  --   HGB 8.4* 9.0* 9.6* 9.3*  HCT 28.4*  30.0* 30.6* 28*  MCV 85.3 86.2 84.1  --   PLT 116* 98* 114* 307   No results found for: TSH Lab Results  Component Value Date   HGBA1C 9.0 (H) 04/19/2024   Lab Results  Component Value Date   TRIG 131 08/11/2015    Significant Diagnostic Results in last 30 days:  No results  found.  Assessment/Plan  1. Right foot pain Resolved ?early neuropathy  No pain when waking up in the morning and no pain when walking Would recommend voltaren gel for now Prior allergy to neurontin listed in chart Foot exam showed minimal sensation loss  2. Uncontrolled type 2 diabetes mellitus with hyperglycemia (HCC) (Primary) CBG dropping in the am Will trial off SSI as he was not on this at home and will be preparing to return home soon Continue toujeo , amaryl , and metoformin  3. Acute delirium Improving  - ALPRAZolam (XANAX) 0.25 MG tablet; Take 1 tablet (0.25 mg total) by mouth at bedtime as needed for anxiety.  Dispense: 14 tablet; Refill: 1  4. S/P ORIF of Right hip Working with therapy and making progress D/c scheduled asa for DVT prophylaxis Reduce mobic  to 7.5 mg and monitor pain.

## 2024-05-29 ENCOUNTER — Other Ambulatory Visit (HOSPITAL_BASED_OUTPATIENT_CLINIC_OR_DEPARTMENT_OTHER): Payer: Self-pay

## 2024-06-01 ENCOUNTER — Other Ambulatory Visit: Payer: Self-pay

## 2024-06-02 ENCOUNTER — Other Ambulatory Visit (HOSPITAL_BASED_OUTPATIENT_CLINIC_OR_DEPARTMENT_OTHER): Payer: Self-pay

## 2024-06-05 ENCOUNTER — Encounter: Payer: Self-pay | Admitting: Adult Health

## 2024-06-05 ENCOUNTER — Non-Acute Institutional Stay (SKILLED_NURSING_FACILITY): Admitting: Adult Health

## 2024-06-05 DIAGNOSIS — G3184 Mild cognitive impairment, so stated: Secondary | ICD-10-CM | POA: Diagnosis not present

## 2024-06-05 DIAGNOSIS — F5101 Primary insomnia: Secondary | ICD-10-CM

## 2024-06-05 DIAGNOSIS — E1165 Type 2 diabetes mellitus with hyperglycemia: Secondary | ICD-10-CM | POA: Diagnosis not present

## 2024-06-05 DIAGNOSIS — S72101D Unspecified trochanteric fracture of right femur, subsequent encounter for closed fracture with routine healing: Secondary | ICD-10-CM

## 2024-06-05 DIAGNOSIS — Z7984 Long term (current) use of oral hypoglycemic drugs: Secondary | ICD-10-CM

## 2024-06-05 DIAGNOSIS — E7800 Pure hypercholesterolemia, unspecified: Secondary | ICD-10-CM

## 2024-06-05 NOTE — Progress Notes (Unsigned)
 Location:  [facility] Room Number: 151 P Place of Service:  SNF ([phone])  Provider: Tawni America, NP   PCP: Charlott Dorn LABOR, MD Patient Care Team: Charlott Dorn LABOR, MD as PCP - General (Internal Medicine)  Extended Emergency Contact Information Primary Emergency Contact: Criscuolo,Sandy Address: [address]          [place], [place] [address] [place] Mobile Phone: [phone] Relation: Spouse Secondary Emergency Contact: Botsford,Rob  [place] Home Phone: [phone] Mobile Phone: [phone] Relation: Son  Code Status: Full Code Goals of care:  Advanced Directive information    04/18/2024    7:25 PM  Advanced Directives  Does Patient Have a Medical Advance Directive? Yes  Type of Estate Agent of [place];Living will  Does patient want to make changes to medical advance directive? No - Patient declined  Copy of Healthcare Power of Attorney in Chart? No - copy requested  Would patient like information on creating a medical advance directive? No - Patient declined     Allergies  Allergen Reactions   Januvia [Sitagliptin] Other (See Comments)    Pancreatitis    Gabapentin Other (See Comments)    Fatigue and cramps    Vicodin [Hydrocodone-Acetaminophen ] Other (See Comments)    Nightmares    Chief Complaint  Patient presents with   Discharge Note    HPI:  83 y.o. male  seen for discharge from [facility] back to [place]  Had a mechanical fall with intertrochanteric right femur fracture s/p intramedullary fixation 04/19/24 Started with 50% WB and progressed to full weight bearing Ambulatory with a walker, independent with ADLs Ready for discharge Continues with some mild pain and uses tramadol  Blood sugars in the morning have been low three times 65 83 85 on toujeo , amaryl , metformin   During his stay mobic  was reduced and pepcid  added for GI protection with  pepcid  at the time he was on asa for DVT prophylaxis which has since been discontinued  Bowels are moving well with miralax  per pt He has some MCI but his wife will be helping him at home. MMSE 23/30 has likely improved since admit Had some issues with confusion in the evening and night and uses prn xanax  and melatonin for sleep which helped.   During his stay he was taken off lisinopril due to low bp when standing. This was prescribed originally due to kidney protection.  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 Hx of IDA last Hgb 9.3  Past Medical History:  Diagnosis Date   BPH (benign prostatic hyperplasia)    Cancer (HCC)    melanoma left arm and back removed   Diabetes mellitus without complication (HCC)    Type II   Diverticulitis    Pancreatitis    many years ago   Renal stone     Past Surgical History:  Procedure Laterality Date   INTRAMEDULLARY (IM) NAIL INTERTROCHANTERIC Right 04/19/2024   Procedure: FIXATION, FRACTURE, INTERTROCHANTERIC, WITH INTRAMEDULLARY ROD;  Surgeon: Fidel Rogue, MD;  Location: [facility];  Service: Orthopedics;  Laterality: Right;   LUMBAR LAMINECTOMY/DECOMPRESSION MICRODISCECTOMY Left 05/04/2018   Procedure: LEFT-SIDED LUMBAR 4-5 MICRODISECTOMY;  Surgeon: Beuford Anes, MD;  Location: [facility] OR;  Service: Orthopedics;  Laterality: Left;   PROSTATECTOMY N/A 01/21/2015   Procedure: PROSTATECTOMY RETROPUBIC;  Surgeon: Arlena Gal, MD;  Location: [facility];  Service: Urology;  Laterality: N/A;   right leg broken (tibia and fibula) 1961  SHOULDER ARTHROSCOPY WITH OPEN ROTATOR CUFF REPAIR  2002   right   TONSILLECTOMY     WISDOM TOOTH EXTRACTION        reports that he has never smoked. He has never used smokeless tobacco. He reports current alcohol  use. He reports that he does not use drugs. Social History   Socioeconomic History   Marital status: Married    Spouse name: Not on file   Number of children: Not on file    Years of education: Not on file   Highest education level: Not on file  Occupational History   Not on file  Tobacco Use   Smoking status: Never   Smokeless tobacco: Never  Vaping Use   Vaping status: Never Used  Substance and Sexual Activity   Alcohol  use: Yes    Comment: occasional   Drug use: No   Sexual activity: Not on file  Other Topics Concern   Not on file  Social History Narrative   Not on file   Social Drivers of Health   Financial Resource Strain: Not on file  Food Insecurity: No Food Insecurity (04/19/2024)   Hunger Vital Sign    Worried About Running Out of Food in the Last Year: Never true    Ran Out of Food in the Last Year: Never true  Transportation Needs: No Transportation Needs (04/19/2024)   PRAPARE - Administrator, Civil Service (Medical): No    Lack of Transportation (Non-Medical): No  Physical Activity: Not on file  Stress: Not on file  Social Connections: Socially Integrated (04/19/2024)   Social Connection and Isolation Panel    Frequency of Communication with Friends and Family: Three times a week    Frequency of Social Gatherings with Friends and Family: More than three times a week    Attends Religious Services: More than 4 times per year    Active Member of [employer] or Organizations: Yes    Attends Banker Meetings: More than 4 times per year    Marital Status: Married  Catering Manager Violence: Not At Risk (04/19/2024)   Humiliation, Afraid, Rape, and Kick questionnaire    Fear of Current or Ex-Partner: No    Emotionally Abused: No    Physically Abused: No    Sexually Abused: No   Functional Status Survey:    Allergies  Allergen Reactions   Januvia [Sitagliptin] Other (See Comments)    Pancreatitis    Gabapentin Other (See Comments)    Fatigue and cramps    Vicodin [Hydrocodone-Acetaminophen ] Other (See Comments)    Nightmares    Pertinent  Health Maintenance Due  Topic Date Due   OPHTHALMOLOGY EXAM  Never  done   Influenza Vaccine  02/11/2024   HEMOGLOBIN A1C  10/18/2024   FOOT EXAM  05/26/2025    Medications: Outpatient Encounter Medications as of 06/05/2024  Medication Sig   acetaminophen  (TYLENOL ) 500 MG tablet Take 1,000 mg by mouth every 8 (eight) hours as needed.   ALPRAZolam  (XANAX ) 0.25 MG tablet Take 1 tablet (0.25 mg total) by mouth at bedtime as needed for anxiety.   Blood Glucose Monitoring Suppl (ONE TOUCH ULTRA 2) w/Device KIT Use as directed   CALCIUM CARBONATE-VIT D-MIN PO Take by mouth as directed. Give 1 tablet by mouth two times a day for replacement   dapagliflozin  propanediol (FARXIGA ) 10 MG TABS tablet Take 1 tablet (10 mg total) by mouth daily.   diclofenac  Sodium (VOLTAREN ) 1 % GEL Apply 2 g topically  3 (three) times daily as needed.   Evolocumab  (REPATHA  SURECLICK) 140 MG/ML SOAJ Inject 140 mg into the skin every 14 (fourteen) days.   ezetimibe  (ZETIA ) 10 MG tablet Take 1 tablet (10 mg total) by mouth daily.   famotidine  (PEPCID ) 20 MG tablet Take 1 tablet (20 mg total) by mouth at bedtime.   glimepiride  (AMARYL ) 4 MG tablet Take 4 mg by mouth 2 (two) times daily.   glucose blood (ONETOUCH ULTRA) test strip TEST UP TO FOUR TIMES DAILY.   guaiFENesin (ROBITUSSIN) 100 MG/5ML liquid Take 10 mLs by mouth every 6 (six) hours as needed for cough or to loosen phlegm.  Give 10 ml by mouth every 6 hours as needed for Cough   insulin  glargine, 1 Unit Dial , (TOUJEO  SOLOSTAR) 300 UNIT/ML Solostar Pen Inject 21 Units into the skin daily.   Insulin  Pen Needle (INSUPEN PEN NEEDLES) 32G X 4 MM MISC Use to check blood sugar once daily   Lancets (ONETOUCH DELICA PLUS LANCET30G) MISC Use upto 2 (two) times daily to check blood sugar   Magnesium Hydroxide (MILK OF MAGNESIA PO) Take by mouth as needed.  Give 45 cc by mouth every 24 hours as needed for Constipation 45 cc po daily prn except with renal failure   melatonin 5 MG TABS Take 5 mg by mouth at bedtime.   meloxicam  (MOBIC ) 15 MG  tablet Take 0.5 tablets (7.5 mg total) by mouth daily.   menthol  (CEPACOL) 3 MG lozenge Take 1 lozenge by mouth every 2 (two) hours as needed for sore throat. Give 1 lozenge by mouth every 2 hours as needed for Sore throat/cough Dissolve in mouth slowly.Do not exceed more than 6 in 24 hours. Notify Provider if not better in 2 days. If res is diabetic use sugar free lozenges   metFORMIN  (GLUCOPHAGE -XR) 500 MG 24 hr tablet Take 2 tablets (1,000 mg total) by mouth 2 (two) times daily at 8 am and 10 pm.   nitroGLYCERIN (NITRODUR - DOSED IN MG/24 HR) 0.4 mg/hr patch Place 0.4 mg onto the skin daily. Give 1 tablet sublingually every 5 minutes as needed for Chest Pain x 3 doses. If no relief, call MD   polyethylene glycol (MIRALAX  / GLYCOLAX ) 17 g packet Take 17 g by mouth daily.  Give 1 scoop by mouth one time a day for constipation   promethazine (PHENERGAN) 12.5 MG suppository Place 12.5 mg rectally every 6 (six) hours as needed for nausea or vomiting.   saccharomyces boulardii (FLORASTOR) 250 MG capsule Take 250 mg by mouth daily.   traMADol  HCl 25 MG TABS Take 50 mg by mouth every 6 (six) hours as needed.   valACYclovir  (VALTREX ) 500 MG tablet Take 1 tablet (500 mg total) by mouth daily as needed for up to 5 days. (Patient not taking: Reported on 05/26/2024)   No facility-administered encounter medications on file as of 06/05/2024.    Review of Systems  Constitutional:  Negative for activity change, appetite change, chills, diaphoresis, fatigue and fever.  HENT:  Negative for congestion.   Respiratory:  Negative for cough, shortness of breath and wheezing.   Cardiovascular:  Negative for chest pain and leg swelling.  Gastrointestinal:  Negative for abdominal distention, abdominal pain, constipation, diarrhea, nausea and vomiting.  Genitourinary:  Negative for difficulty urinating, dysuria and urgency.  Musculoskeletal:  Positive for arthralgias and gait problem. Negative for back pain,  myalgias and neck pain.  Skin:  Positive for wound. Negative for rash.  Neurological:  Negative  for dizziness and weakness.  Psychiatric/Behavioral:  Negative for confusion.     Vitals:   06/05/24 0907  BP: 116/71  Pulse: 81  Resp: 20  Temp: 98.9 F (37.2 C)  SpO2: 96%  Weight: 171 lb (77.6 kg)  Height: 6' 2 (1.88 m)   Body mass index is 21.96 kg/m. Physical Exam Vitals and nursing note reviewed.  Constitutional:      Appearance: Normal appearance.  HENT:     Head: Normocephalic and atraumatic.  Cardiovascular:     Rate and Rhythm: Normal rate and regular rhythm.     Heart sounds: No murmur heard. Pulmonary:     Effort: Pulmonary effort is normal. No respiratory distress.     Breath sounds: Normal breath sounds. No wheezing.  Abdominal:     General: Bowel sounds are normal. There is no distension.     Palpations: Abdomen is soft.     Tenderness: There is no abdominal tenderness.  Musculoskeletal:     Cervical back: No rigidity.     Right lower leg: No edema.     Left lower leg: No edema.  Lymphadenopathy:     Cervical: No cervical adenopathy.  Skin:    General: Skin is warm and dry.     Comments: Circular pencil eraser size scabbed over wound to the posterior right heel  Neurological:     General: No focal deficit present.     Mental Status: He is alert and oriented to person, place, and time. Mental status is at baseline.  Psychiatric:        Mood and Affect: Mood normal.     Labs reviewed: Basic Metabolic Panel: Recent Labs    04/18/24 2037 04/20/24 0659 04/27/24 0000  NA 140 140 136*  K 4.0 4.1 4.2  CL 105 106 102  CO2 24 27 24*  GLUCOSE 224* 157*  --   BUN 11 11 22*  CREATININE 0.90 0.79 0.7  CALCIUM 8.5* 8.0* 8.0*   Liver Function Tests: No results for input(s): AST, ALT, ALKPHOS, BILITOT, PROT, ALBUMIN  in the last 8760 hours. No results for input(s): LIPASE, AMYLASE in the last 8760 hours. No results for input(s): AMMONIA  in the last 8760 hours. CBC: Recent Labs    04/19/24 1100 04/20/24 0659 04/21/24 0922 04/27/24 0000  WBC 7.5 8.0 8.6 10.2  NEUTROABS 5.7 6.3 7.4  --   HGB 8.4* 9.0* 9.6* 9.3*  HCT 28.4* 30.0* 30.6* 28*  MCV 85.3 86.2 84.1  --   PLT 116* 98* 114* 307   Cardiac Enzymes: No results for input(s): CKTOTAL, CKMB, CKMBINDEX, TROPONINI in the last 8760 hours. BNP: Invalid input(s): POCBNP CBG: Recent Labs    04/21/24 2135 04/22/24 0757 04/22/24 1201  GLUCAP 158* 194* 197*    Procedures and Imaging Studies During Stay: No results found.  Assessment/Plan:    1. Closed fracture of trochanter of right femur with routine healing, subsequent encounter (Primary) S/p ORIF Improved physical mobility and ready to discharge back to [place] with support from his wife F/U with PCP in 2-4 weeks Recommend tapering off tramadol , will send current supply Mobic  reduced to 7.5 mg, consider tapering off and then can taper off pepcid  as well  2. Hypercholesterolemia On Repatha  and zetia   3. Mild cognitive impairment Noted, improved during his stay as he experienced some delirium Will have support from his wife in [place]   4. Uncontrolled type 2 diabetes mellitus with hyperglycemia (HCC) A1C was 9 during his hospitalization and he was  on meal coverage as well which was discontinued.  Reduce Toujeo  to 18 units due to low am readings Continues on amaryl , metformin , farxiga  F/U with PCP   5. Primary insomnia On melatonin and xanax  as needed Will not refill xanax , can exhaust current supply   6. Orthostatic hypotension Off lisinopril due to orthostatic hypotension.  F/U with PCP  7. Iron deficiency anemia Hgb on 9.3 04/27/24 which was stable Recommend f/u out pt    Patient is being discharged with the following home health services:  PT  Patient is being discharged with the following durable medical equipment:  has a walker   Patient has been advised to f/u with their PCP in 1-2  weeks to for a transitions of care visit.  Social services at their facility was responsible for arranging this appointment.  Pt was provided with adequate prescriptions of noncontrolled medications to reach the scheduled appointment .  For controlled substances, a limited supply was provided as appropriate for the individual patient.  If the pt normally receives these medications from a pain clinic or has a contract with another physician, these medications should be received from that clinic or physician only).    Future labs/tests needed:  CBC BMP A1C  Recommend the Flu vaccine   Discharge review, assessment, plan, and coordination took >30 min

## 2024-06-06 ENCOUNTER — Other Ambulatory Visit: Payer: Self-pay

## 2024-06-06 ENCOUNTER — Encounter: Payer: Self-pay | Admitting: Adult Health

## 2024-06-06 DIAGNOSIS — G4700 Insomnia, unspecified: Secondary | ICD-10-CM | POA: Insufficient documentation

## 2024-06-06 DIAGNOSIS — G3184 Mild cognitive impairment, so stated: Secondary | ICD-10-CM | POA: Insufficient documentation

## 2024-06-06 MED ORDER — TOUJEO SOLOSTAR 300 UNIT/ML ~~LOC~~ SOPN: 18.0000 [IU] | PEN_INJECTOR | Freq: Every day | SUBCUTANEOUS | Status: AC

## 2024-06-12 ENCOUNTER — Other Ambulatory Visit (HOSPITAL_BASED_OUTPATIENT_CLINIC_OR_DEPARTMENT_OTHER): Payer: Self-pay

## 2024-06-13 ENCOUNTER — Other Ambulatory Visit (HOSPITAL_BASED_OUTPATIENT_CLINIC_OR_DEPARTMENT_OTHER): Payer: Self-pay

## 2024-06-17 ENCOUNTER — Other Ambulatory Visit: Payer: Self-pay

## 2024-06-26 ENCOUNTER — Other Ambulatory Visit (HOSPITAL_BASED_OUTPATIENT_CLINIC_OR_DEPARTMENT_OTHER): Payer: Self-pay

## 2024-06-26 ENCOUNTER — Other Ambulatory Visit: Payer: Self-pay

## 2024-06-26 MED ORDER — MELATONIN 3 MG PO TABS
3.0000 mg | ORAL_TABLET | Freq: Every evening | ORAL | 3 refills | Status: AC | PRN
  Filled 2024-06-28: qty 30, 30d supply, fill #0

## 2024-06-26 MED ORDER — MELOXICAM 7.5 MG PO TABS
7.5000 mg | ORAL_TABLET | Freq: Every day | ORAL | 3 refills | Status: AC | PRN
  Filled 2024-06-28: qty 90, 90d supply, fill #0
  Filled 2024-08-02: qty 90, 90d supply, fill #1

## 2024-06-26 MED ORDER — MAGNESIUM OXIDE 400 MG PO TABS
800.0000 mg | ORAL_TABLET | Freq: Two times a day (BID) | ORAL | 3 refills | Status: AC
  Filled 2024-06-27: qty 120, 30d supply, fill #0
  Filled 2024-06-27: qty 360, 90d supply, fill #0
  Filled 2024-06-28: qty 120, 30d supply, fill #0

## 2024-06-26 MED ORDER — EZETIMIBE 10 MG PO TABS
10.0000 mg | ORAL_TABLET | Freq: Every day | ORAL | 3 refills | Status: AC
  Filled 2024-07-27: qty 90, 90d supply, fill #0

## 2024-06-26 MED ORDER — GLIMEPIRIDE 4 MG PO TABS
4.0000 mg | ORAL_TABLET | Freq: Two times a day (BID) | ORAL | 3 refills | Status: AC
  Filled 2024-06-28: qty 60, 30d supply, fill #0
  Filled 2024-07-27: qty 60, 30d supply, fill #1

## 2024-06-26 MED ORDER — METFORMIN HCL ER 500 MG PO TB24
1000.0000 mg | ORAL_TABLET | Freq: Two times a day (BID) | ORAL | 3 refills | Status: AC
  Filled 2024-06-27: qty 120, 30d supply, fill #0
  Filled 2024-06-27: qty 360, 90d supply, fill #0
  Filled 2024-06-28: qty 120, 30d supply, fill #0
  Filled 2024-07-27: qty 120, 30d supply, fill #1

## 2024-06-26 MED ORDER — REPATHA SURECLICK 140 MG/ML ~~LOC~~ SOAJ: 140.0000 mg | SUBCUTANEOUS | 3 refills | Status: AC

## 2024-06-26 MED ORDER — DAPAGLIFLOZIN PROPANEDIOL 10 MG PO TABS: 10.0000 mg | ORAL_TABLET | Freq: Every day | ORAL | 3 refills | Status: AC

## 2024-06-26 MED ORDER — LANTUS SOLOSTAR 100 UNIT/ML ~~LOC~~ SOPN
18.0000 [IU] | PEN_INJECTOR | Freq: Every day | SUBCUTANEOUS | 3 refills | Status: AC
  Filled 2024-06-27 – 2024-06-29 (×3): qty 15, 83d supply, fill #0

## 2024-06-26 MED ORDER — CALCIUM + D3 250-3 MG-MCG PO TABS
1.0000 | ORAL_TABLET | Freq: Every day | ORAL | 3 refills | Status: AC
  Filled 2024-06-27: qty 90, fill #0

## 2024-06-26 MED ORDER — VITAMIN D (CHOLECALCIFEROL) 25 MCG (1000 UT) PO CAPS
25.0000 ug | ORAL_CAPSULE | Freq: Every day | ORAL | 3 refills | Status: AC
  Filled 2024-06-27: qty 90, 90d supply, fill #0
  Filled 2024-06-28 – 2024-06-29 (×3): qty 30, 30d supply, fill #0
  Filled 2024-07-26 – 2024-08-03 (×4): qty 30, 30d supply, fill #1

## 2024-06-26 MED ORDER — VITAMIN B 12 500 MCG PO TABS
1.0000 | ORAL_TABLET | Freq: Every day | ORAL | 3 refills | Status: DC
  Filled 2024-06-27: qty 30, 30d supply, fill #0
  Filled 2024-06-27: qty 90, 90d supply, fill #0
  Filled 2024-06-28 – 2024-06-29 (×3): qty 30, 30d supply, fill #0
  Filled 2024-07-27: qty 30, 30d supply, fill #1

## 2024-06-26 MED ORDER — EZETIMIBE 10 MG PO TABS: 10.0000 mg | ORAL_TABLET | Freq: Every day | ORAL | 3 refills | Status: AC

## 2024-06-26 MED ORDER — ACYCLOVIR 5 % EX OINT
1.0000 | TOPICAL_OINTMENT | Freq: Every day | CUTANEOUS | 1 refills | Status: AC
  Filled 2024-06-27: qty 45, 90d supply, fill #0

## 2024-06-27 ENCOUNTER — Other Ambulatory Visit (HOSPITAL_BASED_OUTPATIENT_CLINIC_OR_DEPARTMENT_OTHER): Payer: Self-pay

## 2024-06-27 ENCOUNTER — Other Ambulatory Visit: Payer: Self-pay

## 2024-06-27 ENCOUNTER — Other Ambulatory Visit (HOSPITAL_BASED_OUTPATIENT_CLINIC_OR_DEPARTMENT_OTHER): Payer: Self-pay | Admitting: Internal Medicine

## 2024-06-27 DIAGNOSIS — S72301D Unspecified fracture of shaft of right femur, subsequent encounter for closed fracture with routine healing: Secondary | ICD-10-CM

## 2024-06-28 ENCOUNTER — Other Ambulatory Visit: Payer: Self-pay

## 2024-06-28 ENCOUNTER — Other Ambulatory Visit (HOSPITAL_BASED_OUTPATIENT_CLINIC_OR_DEPARTMENT_OTHER): Payer: Self-pay

## 2024-06-29 ENCOUNTER — Other Ambulatory Visit: Payer: Self-pay

## 2024-06-29 ENCOUNTER — Other Ambulatory Visit (HOSPITAL_BASED_OUTPATIENT_CLINIC_OR_DEPARTMENT_OTHER): Payer: Self-pay

## 2024-06-30 ENCOUNTER — Other Ambulatory Visit (HOSPITAL_COMMUNITY): Payer: Self-pay

## 2024-07-07 ENCOUNTER — Other Ambulatory Visit: Payer: Self-pay

## 2024-07-07 ENCOUNTER — Other Ambulatory Visit (HOSPITAL_BASED_OUTPATIENT_CLINIC_OR_DEPARTMENT_OTHER): Payer: Self-pay

## 2024-07-26 ENCOUNTER — Other Ambulatory Visit (HOSPITAL_BASED_OUTPATIENT_CLINIC_OR_DEPARTMENT_OTHER): Payer: Self-pay

## 2024-07-26 ENCOUNTER — Other Ambulatory Visit: Payer: Self-pay

## 2024-07-27 ENCOUNTER — Other Ambulatory Visit (HOSPITAL_BASED_OUTPATIENT_CLINIC_OR_DEPARTMENT_OTHER): Payer: Self-pay

## 2024-07-31 ENCOUNTER — Other Ambulatory Visit: Payer: Self-pay

## 2024-08-02 ENCOUNTER — Other Ambulatory Visit (HOSPITAL_BASED_OUTPATIENT_CLINIC_OR_DEPARTMENT_OTHER): Payer: Self-pay

## 2024-08-02 ENCOUNTER — Other Ambulatory Visit: Payer: Self-pay

## 2024-08-02 MED ORDER — VITAMIN B-12 500 MCG PO TABS
500.0000 ug | ORAL_TABLET | Freq: Every day | ORAL | 9 refills | Status: AC
  Filled 2024-08-02 (×3): qty 30, 30d supply, fill #0

## 2024-08-03 ENCOUNTER — Other Ambulatory Visit: Payer: Self-pay

## 2024-08-03 ENCOUNTER — Other Ambulatory Visit (HOSPITAL_BASED_OUTPATIENT_CLINIC_OR_DEPARTMENT_OTHER): Payer: Self-pay

## 2024-08-08 ENCOUNTER — Other Ambulatory Visit (HOSPITAL_BASED_OUTPATIENT_CLINIC_OR_DEPARTMENT_OTHER): Payer: Self-pay

## 2024-08-17 ENCOUNTER — Other Ambulatory Visit (HOSPITAL_BASED_OUTPATIENT_CLINIC_OR_DEPARTMENT_OTHER): Payer: Self-pay
